# Patient Record
Sex: Female | Born: 1955 | Hispanic: No | Marital: Married | Smoking: Never smoker
Health system: Western US, Academic
[De-identification: ages and names within clinical notes are randomized; demographics above are authoritative.]

## PROBLEM LIST (undated history)

## (undated) MED ORDER — ACETAMINOPHEN 325 MG PO TABS
650.00 mg | ORAL_TABLET | Freq: Once | ORAL | Status: AC
Start: 2022-08-25 — End: 2022-08-25

## (undated) MED ORDER — SODIUM CHLORIDE 0.9 % IV BOLUS
500.00 mL | INJECTION | Freq: Once | INTRAVENOUS | Status: AC
Start: 2022-05-05 — End: 2022-05-05

## (undated) MED ORDER — OCTREOTIDE ACETATE 30 MG IM KIT
30.00 mg | PACK | Freq: Once | INTRAMUSCULAR | Status: AC
Start: 2021-03-04 — End: 2021-03-05

## (undated) MED ORDER — OCTREOTIDE ACETATE 30 MG IM KIT
30.00 mg | PACK | Freq: Once | INTRAMUSCULAR | Status: AC
Start: 2022-05-28 — End: 2022-05-27

## (undated) MED ORDER — DEXAMETHASONE SODIUM PHOSPHATE 10 MG/ML IJ SOLN
10.00 mg | Freq: Once | INTRAMUSCULAR | Status: AC | PRN
Start: 2022-06-30 — End: ?

## (undated) MED ORDER — ONDANSETRON HCL 4 MG/2ML IV SOLN
8.00 mg | Freq: Once | INTRAMUSCULAR | Status: AC
Start: 2022-10-20 — End: 2022-10-20

## (undated) MED ORDER — ACETAMINOPHEN 325 MG PO TABS
650.00 mg | ORAL_TABLET | Freq: Once | ORAL | Status: AC
Start: 2022-10-20 — End: 2022-10-20

## (undated) MED ORDER — ONDANSETRON HCL 4 MG/2ML IV SOLN
8.00 mg | Freq: Once | INTRAMUSCULAR | Status: AC
Start: 2022-08-25 — End: 2022-08-25

## (undated) MED ORDER — OCTREOTIDE ACETATE 30 MG IM KIT
30.0000 mg | PACK | Freq: Once | INTRAMUSCULAR | Status: AC
Start: 2021-11-11 — End: 2021-11-11

## (undated) MED ORDER — ACETAMINOPHEN 325 MG PO TABS
650.00 mg | ORAL_TABLET | Freq: Once | ORAL | Status: AC | PRN
Start: 2022-08-25 — End: 2022-08-25

## (undated) MED ORDER — OCTREOTIDE ACETATE 30 MG IM KIT
30.0000 mg | PACK | Freq: Once | INTRAMUSCULAR | Status: AC
Start: 2021-10-14 — End: 2021-10-14

## (undated) MED ORDER — OCTREOTIDE ACETATE 30 MG IM KIT
30.00 mg | PACK | Freq: Once | INTRAMUSCULAR | Status: AC
Start: 2022-07-29 — End: 2022-07-29

## (undated) MED ORDER — FAMOTIDINE (PF) 20 MG/2ML IV SOLN
20.00 mg | Freq: Once | INTRAVENOUS | Status: AC | PRN
Start: 2022-10-20 — End: 2022-10-21

## (undated) MED ORDER — SODIUM CHLORIDE 0.9 % IV SOLN
Freq: Once | INTRAVENOUS | Status: AC
Start: 2022-10-20 — End: 2022-10-20

## (undated) MED ORDER — OCTREOTIDE ACETATE 30 MG IM KIT
30.00 mg | PACK | Freq: Once | INTRAMUSCULAR | Status: AC
Start: 2022-10-20 — End: 2022-10-20

## (undated) MED ORDER — DEXTROSE 50 % IV SOLN
25.00 g | Freq: Once | INTRAVENOUS | Status: AC | PRN
Start: 2022-10-20 — End: 2022-10-20

## (undated) MED ORDER — AMINO ACID 5 % IV SOLN
1000.00 mL | Freq: Once | INTRAVENOUS | Status: AC
Start: 2022-06-30 — End: 2022-06-30

## (undated) MED ORDER — AMINO ACID 5 % IV SOLN
1000.00 mL | Freq: Once | INTRAVENOUS | Status: AC
Start: 2022-05-05 — End: 2022-05-05

## (undated) MED ORDER — DEXAMETHASONE SODIUM PHOSPHATE 10 MG/ML IJ SOLN
10.00 mg | Freq: Once | INTRAMUSCULAR | Status: AC
Start: 2022-05-05 — End: 2022-05-05

## (undated) MED ORDER — AMINO ACID 5 % IV SOLN
1000.00 mL | Freq: Once | INTRAVENOUS | Status: AC
Start: 2022-08-25 — End: 2022-08-25

## (undated) MED ORDER — OCTREOTIDE ACETATE 30 MG IM KIT
30.0000 mg | PACK | Freq: Once | INTRAMUSCULAR | Status: AC
Start: 2021-08-19 — End: 2021-08-19

## (undated) MED ORDER — OCTREOTIDE ACETATE 30 MG IM KIT
30.00 mg | PACK | Freq: Once | INTRAMUSCULAR | Status: AC
Start: 2022-02-03 — End: 2022-02-03

## (undated) MED ORDER — SODIUM CHLORIDE 0.9 % IV SOLN
Freq: Once | INTRAVENOUS | Status: AC
Start: 2022-06-30 — End: 2022-06-30

## (undated) MED ORDER — ONDANSETRON HCL 4 MG/2ML IV SOLN
8.00 mg | Freq: Once | INTRAMUSCULAR | Status: AC
Start: 2022-06-30 — End: 2022-06-30

## (undated) MED ORDER — OCTREOTIDE ACETATE 30 MG IM KIT
30.00 mg | PACK | Freq: Once | INTRAMUSCULAR | Status: AC
Start: 2020-07-23 — End: 2020-07-23

## (undated) MED ORDER — LANREOTIDE ACETATE 120 MG/0.5ML SC SOLN
120.00 mg | Freq: Once | SUBCUTANEOUS | Status: AC
Start: 2023-01-21 — End: 2023-01-21

## (undated) MED ORDER — SODIUM CHLORIDE 0.9 % IV BOLUS
500.00 mL | INJECTION | Freq: Once | INTRAVENOUS | Status: AC
Start: 2022-10-20 — End: 2022-10-20

## (undated) MED ORDER — LANREOTIDE ACETATE 120 MG/0.5ML SC SOLN
120.00 mg | Freq: Once | SUBCUTANEOUS | Status: AC
Start: 2022-11-26 — End: 2022-11-26

## (undated) MED ORDER — OCTREOTIDE ACETATE 30 MG IM KIT
30.0000 mg | PACK | Freq: Once | INTRAMUSCULAR | Status: AC
Start: 2021-04-01 — End: 2021-04-02

## (undated) MED ORDER — DEXTROSE 5 % IV BOLUS
1000.00 mL | INJECTION | Freq: Once | INTRAVENOUS | Status: AC | PRN
Start: 2022-10-20 — End: ?

## (undated) MED ORDER — ONDANSETRON HCL 4 MG/2ML IV SOLN
8.00 mg | Freq: Once | INTRAMUSCULAR | Status: AC | PRN
Start: 2022-08-25 — End: 2022-08-25

## (undated) MED ORDER — AMINO ACID 5 % IV SOLN
1000.00 mL | Freq: Once | INTRAVENOUS | Status: AC
Start: 2022-10-20 — End: 2022-10-20

## (undated) MED ORDER — DEXTROSE 50 % IV SOLN
25.00 g | Freq: Once | INTRAVENOUS | Status: AC | PRN
Start: 2022-05-05 — End: 2022-05-05

## (undated) MED ORDER — OCTREOTIDE ACETATE 30 MG IM KIT
30.00 mg | PACK | Freq: Once | INTRAMUSCULAR | Status: AC
Start: 2021-01-07 — End: 2021-01-07

## (undated) MED ORDER — OCTREOTIDE ACETATE 30 MG IM KIT
30.00 mg | PACK | Freq: Once | INTRAMUSCULAR | Status: AC
Start: 2020-10-15 — End: 2020-10-15

## (undated) MED ORDER — SODIUM CHLORIDE 0.9 % IV SOLN
10.00 mg | Freq: Once | INTRAVENOUS | Status: AC | PRN
Start: 2022-05-05 — End: ?

## (undated) MED ORDER — LANREOTIDE ACETATE 120 MG/0.5ML SC SOLN
120.00 mg | Freq: Once | SUBCUTANEOUS | Status: AC
Start: 2023-03-18 — End: 2023-03-18

## (undated) MED ORDER — LANREOTIDE ACETATE 120 MG/0.5ML SC SOLN
120.0000 mg | Freq: Once | SUBCUTANEOUS | Status: AC
Start: 2023-07-08 — End: 2023-07-10

## (undated) MED ORDER — ONDANSETRON HCL 4 MG/2ML IV SOLN
8.00 mg | Freq: Once | INTRAMUSCULAR | Status: AC
Start: 2022-05-05 — End: 2022-05-05

## (undated) MED ORDER — OCTREOTIDE ACETATE 30 MG IM KIT
30.00 mg | PACK | Freq: Once | INTRAMUSCULAR | Status: AC
Start: 2020-11-12 — End: 2020-11-12

## (undated) MED ORDER — DEXTROSE 50 % IV SOLN
25.00 g | Freq: Once | INTRAVENOUS | Status: AC | PRN
Start: 2022-08-25 — End: 2022-08-25

## (undated) MED ORDER — SODIUM CHLORIDE 0.9 % IV SOLN
10.00 mg | Freq: Once | INTRAVENOUS | Status: AC
Start: 2022-08-25 — End: 2022-08-25

## (undated) MED ORDER — FAMOTIDINE (PF) 20 MG/2ML IV SOLN
20.00 mg | Freq: Once | INTRAVENOUS | Status: AC
Start: 2022-08-25 — End: 2022-08-25

## (undated) MED ORDER — SODIUM CHLORIDE 0.9 % IV BOLUS
500.00 mL | INJECTION | Freq: Once | INTRAVENOUS | Status: AC
Start: 2022-06-30 — End: 2022-06-30

## (undated) MED ORDER — OCTREOTIDE ACETATE 30 MG IM KIT
30.0000 mg | PACK | Freq: Once | INTRAMUSCULAR | Status: AC
Start: 2021-07-22 — End: 2021-07-22

## (undated) MED ORDER — LANREOTIDE ACETATE 120 MG/0.5ML SC SOLN
120.00 mg | Freq: Once | SUBCUTANEOUS | Status: AC
Start: 2022-12-24 — End: 2022-12-24

## (undated) MED ORDER — ACETAMINOPHEN 325 MG PO TABS
650.00 mg | ORAL_TABLET | Freq: Once | ORAL | Status: AC | PRN
Start: 2022-06-30 — End: 2022-06-30

## (undated) MED ORDER — ONDANSETRON HCL 4 MG/2ML IV SOLN
8.00 mg | Freq: Once | INTRAMUSCULAR | Status: AC | PRN
Start: 2022-10-20 — End: 2022-10-20

## (undated) MED ORDER — OCTREOTIDE ACETATE 30 MG IM KIT
30.00 mg | PACK | Freq: Once | INTRAMUSCULAR | Status: AC
Start: 2020-08-20 — End: 2020-08-20

## (undated) MED ORDER — FAMOTIDINE (PF) 20 MG/2ML IV SOLN
20.00 mg | Freq: Once | INTRAVENOUS | Status: AC | PRN
Start: 2022-06-30 — End: 2022-07-01

## (undated) MED ORDER — OCTREOTIDE ACETATE 30 MG IM KIT
30.00 mg | PACK | Freq: Once | INTRAMUSCULAR | Status: AC
Start: 2020-06-25 — End: 2020-06-25

## (undated) MED ORDER — OCTREOTIDE ACETATE 30 MG IM KIT
30.00 mg | PACK | Freq: Once | INTRAMUSCULAR | Status: AC
Start: 2022-03-03 — End: 2022-03-03

## (undated) MED ORDER — OCTREOTIDE ACETATE 30 MG IM KIT
30.00 mg | PACK | Freq: Once | INTRAMUSCULAR | Status: AC
Start: 2022-03-31 — End: 2022-03-31

## (undated) MED ORDER — LANREOTIDE ACETATE 120 MG/0.5ML SC SOLN
120.0000 mg | Freq: Once | SUBCUTANEOUS | Status: AC
Start: 2023-06-10 — End: 2023-06-12

## (undated) MED ORDER — INSULIN REGULAR HUMAN 100 UNIT/ML IJ SOLN
10.00 [IU] | Freq: Once | INTRAMUSCULAR | Status: AC | PRN
Start: 2022-08-25 — End: 2022-08-25

## (undated) MED ORDER — LANREOTIDE ACETATE 120 MG/0.5ML SC SOLN
120.0000 mg | Freq: Once | SUBCUTANEOUS | Status: AC
Start: 2023-05-15 — End: 2023-05-13

## (undated) MED ORDER — DEXTROSE 5 % IV BOLUS
1000.00 mL | INJECTION | Freq: Once | INTRAVENOUS | Status: AC | PRN
Start: 2022-06-30 — End: ?

## (undated) MED ORDER — ACETAMINOPHEN 325 MG PO TABS
650.00 mg | ORAL_TABLET | Freq: Once | ORAL | Status: AC
Start: 2022-06-30 — End: 2022-06-30

## (undated) MED ORDER — ACETAMINOPHEN 325 MG PO TABS
650.00 mg | ORAL_TABLET | Freq: Once | ORAL | Status: AC
Start: 2022-05-05 — End: 2022-05-05

## (undated) MED ORDER — OCTREOTIDE ACETATE 30 MG IM KIT
30.00 mg | PACK | Freq: Once | INTRAMUSCULAR | Status: AC
Start: 2022-06-30 — End: 2022-06-30

## (undated) MED ORDER — OCTREOTIDE ACETATE 30 MG IM KIT
30.00 mg | PACK | Freq: Once | INTRAMUSCULAR | Status: AC
Start: 2022-05-05 — End: 2022-05-05

## (undated) MED ORDER — OCTREOTIDE ACETATE 30 MG IM KIT
30.00 mg | PACK | Freq: Once | INTRAMUSCULAR | Status: AC
Start: 2022-08-25 — End: 2022-08-25

## (undated) MED ORDER — OCTREOTIDE ACETATE 30 MG IM KIT
30.00 mg | PACK | Freq: Once | INTRAMUSCULAR | Status: AC
Start: 2020-12-10 — End: 2020-12-10

## (undated) MED ORDER — OCTREOTIDE ACETATE 30 MG IM KIT
30.00 mg | PACK | Freq: Once | INTRAMUSCULAR | Status: AC
Start: 2022-09-22 — End: 2022-09-22

## (undated) MED ORDER — OCTREOTIDE ACETATE 30 MG IM KIT
30.0000 mg | PACK | Freq: Once | INTRAMUSCULAR | Status: AC
Start: 2021-12-09 — End: 2021-12-09

## (undated) MED ORDER — SODIUM CHLORIDE 0.9 % IV SOLN
Freq: Once | INTRAVENOUS | Status: AC
Start: 2022-05-05 — End: 2022-05-05

## (undated) MED ORDER — FAMOTIDINE (PF) 20 MG/2ML IV SOLN
20.00 mg | Freq: Once | INTRAVENOUS | Status: AC
Start: 2022-10-20 — End: 2022-10-20

## (undated) MED ORDER — FAMOTIDINE (PF) 20 MG/2ML IV SOLN
20.00 mg | Freq: Once | INTRAVENOUS | Status: AC | PRN
Start: 2022-08-25 — End: 2022-08-26

## (undated) MED ORDER — LANREOTIDE ACETATE 120 MG/0.5ML SC SOLN
120.00 mg | Freq: Once | SUBCUTANEOUS | Status: AC
Start: 2023-02-18 — End: 2023-02-18

## (undated) MED ORDER — ONDANSETRON HCL 4 MG/2ML IV SOLN
8.00 mg | Freq: Once | INTRAMUSCULAR | Status: AC | PRN
Start: 2022-05-05 — End: 2022-05-05

## (undated) MED ORDER — OCTREOTIDE ACETATE 30 MG IM KIT
30.00 mg | PACK | Freq: Once | INTRAMUSCULAR | Status: AC
Start: 2021-02-04 — End: 2021-02-05

## (undated) MED ORDER — FAMOTIDINE (PF) 20 MG/2ML IV SOLN
20.00 mg | Freq: Once | INTRAVENOUS | Status: AC
Start: 2022-06-30 — End: 2022-06-30

## (undated) MED ORDER — DEXAMETHASONE SODIUM PHOSPHATE 10 MG/ML IJ SOLN
10.00 mg | Freq: Once | INTRAMUSCULAR | Status: AC
Start: 2022-10-20 — End: 2022-10-20

## (undated) MED ORDER — FAMOTIDINE (PF) 20 MG/2ML IV SOLN
20.00 mg | Freq: Once | INTRAVENOUS | Status: AC
Start: 2022-05-05 — End: 2022-05-05

## (undated) MED ORDER — LANREOTIDE ACETATE 120 MG/0.5ML SC SOLN
120.00 mg | Freq: Once | SUBCUTANEOUS | Status: AC
Start: 2023-04-15 — End: 2023-04-15

## (undated) MED ORDER — OCTREOTIDE ACETATE 30 MG IM KIT
30.0000 mg | PACK | Freq: Once | INTRAMUSCULAR | Status: AC
Start: 2021-06-24 — End: 2021-06-25

## (undated) MED ORDER — DEXAMETHASONE SODIUM PHOSPHATE 10 MG/ML IJ SOLN
10.00 mg | Freq: Once | INTRAMUSCULAR | Status: AC | PRN
Start: 2022-08-25 — End: ?

## (undated) MED ORDER — OCTREOTIDE ACETATE 30 MG IM KIT
30.0000 mg | PACK | Freq: Once | INTRAMUSCULAR | Status: AC
Start: 2021-04-29 — End: 2021-04-30

## (undated) MED ORDER — SODIUM CHLORIDE 0.9 % IV BOLUS
500.00 mL | INJECTION | Freq: Once | INTRAVENOUS | Status: AC
Start: 2022-08-25 — End: 2022-08-25

## (undated) MED ORDER — DEXAMETHASONE SODIUM PHOSPHATE 10 MG/ML IJ SOLN
10.00 mg | Freq: Once | INTRAMUSCULAR | Status: AC
Start: 2022-06-30 — End: 2022-06-30

## (undated) MED ORDER — SODIUM CHLORIDE 0.9 % IV SOLN
Freq: Once | INTRAVENOUS | Status: AC
Start: 2022-08-25 — End: 2022-08-25

## (undated) MED ORDER — DEXTROSE 5 % IV BOLUS
1000.00 mL | INJECTION | Freq: Once | INTRAVENOUS | Status: AC | PRN
Start: 2022-08-25 — End: ?

## (undated) MED ORDER — LANREOTIDE ACETATE 120 MG/0.5ML SC SOLN
120.0000 mg | Freq: Once | SUBCUTANEOUS | Status: AC
Start: 2023-08-05 — End: 2023-08-07

## (undated) MED ORDER — OCTREOTIDE ACETATE 30 MG IM KIT
30.0000 mg | PACK | Freq: Once | INTRAMUSCULAR | Status: AC
Start: 2022-01-06 — End: 2022-01-06

## (undated) MED ORDER — ONDANSETRON HCL 4 MG/2ML IV SOLN
8.00 mg | Freq: Once | INTRAMUSCULAR | Status: AC | PRN
Start: 2022-06-30 — End: 2022-06-30

## (undated) MED ORDER — FAMOTIDINE (PF) 20 MG/2ML IV SOLN
20.00 mg | Freq: Once | INTRAVENOUS | Status: AC | PRN
Start: 2022-05-05 — End: 2022-05-06

## (undated) MED ORDER — DEXAMETHASONE SODIUM PHOSPHATE 10 MG/ML IJ SOLN
10.00 mg | Freq: Once | INTRAMUSCULAR | Status: AC | PRN
Start: 2022-10-20 — End: ?

## (undated) MED ORDER — DEXTROSE 5 % IV BOLUS
1000.00 mL | INJECTION | Freq: Once | INTRAVENOUS | Status: AC | PRN
Start: 2022-05-05 — End: ?

## (undated) MED ORDER — OCTREOTIDE ACETATE 30 MG IM KIT
30.0000 mg | PACK | Freq: Once | INTRAMUSCULAR | Status: AC
Start: 2021-09-16 — End: 2021-09-16

## (undated) MED ORDER — OCTREOTIDE ACETATE 30 MG IM KIT
30.0000 mg | PACK | Freq: Once | INTRAMUSCULAR | Status: AC
Start: 2021-05-27 — End: 2021-05-28

## (undated) MED ORDER — ACETAMINOPHEN 325 MG PO TABS
650.00 mg | ORAL_TABLET | Freq: Once | ORAL | Status: AC | PRN
Start: 2022-10-20 — End: 2022-10-20

## (undated) MED ORDER — INSULIN REGULAR HUMAN 100 UNIT/ML IJ SOLN
10.00 [IU] | Freq: Once | INTRAMUSCULAR | Status: AC | PRN
Start: 2022-10-20 — End: 2022-10-20

## (undated) MED ORDER — ACETAMINOPHEN 325 MG PO TABS
650.00 mg | ORAL_TABLET | Freq: Once | ORAL | Status: AC | PRN
Start: 2022-05-05 — End: 2022-05-05

## (undated) MED ORDER — DEXTROSE 50 % IV SOLN
25.00 g | Freq: Once | INTRAVENOUS | Status: AC | PRN
Start: 2022-06-30 — End: 2022-06-30

## (undated) MED ORDER — INSULIN REGULAR HUMAN 100 UNIT/ML IJ SOLN
10.00 [IU] | Freq: Once | INTRAMUSCULAR | Status: AC | PRN
Start: 2022-06-30 — End: 2022-06-30

## (undated) MED ORDER — INSULIN REGULAR HUMAN 100 UNIT/ML IJ SOLN
10.00 [IU] | Freq: Once | INTRAMUSCULAR | Status: AC | PRN
Start: 2022-05-05 — End: 2022-05-05

## (undated) MED ORDER — OCTREOTIDE ACETATE 30 MG IM KIT
30.00 mg | PACK | Freq: Once | INTRAMUSCULAR | Status: AC
Start: 2020-09-17 — End: 2020-09-17

---

## 2017-09-08 DIAGNOSIS — C254 Malignant neoplasm of endocrine pancreas: Secondary | ICD-10-CM | POA: Insufficient documentation

## 2019-04-12 ENCOUNTER — Telehealth (HOSPITAL_BASED_OUTPATIENT_CLINIC_OR_DEPARTMENT_OTHER): Payer: Self-pay | Admitting: Hematology & Oncology

## 2019-04-12 NOTE — Telephone Encounter (Signed)
Please note this NEW patient has been scheduled in your clinic on Thursday, 11/5.  Please review records in time to ensure you have everything that you need for this upcoming appointment. Please let me know if additional records are needed.     Thank you,   Andee Lineman

## 2019-04-27 ENCOUNTER — Ambulatory Visit: Payer: TRICARE Prime—HMO | Attending: Hematology & Oncology | Admitting: Hematology & Oncology

## 2019-04-27 VITALS — BP 153/81 | HR 122 | Temp 98.7°F | Resp 16 | Ht 66.0 in | Wt 159.6 lb

## 2019-04-27 DIAGNOSIS — C787 Secondary malignant neoplasm of liver and intrahepatic bile duct: Secondary | ICD-10-CM | POA: Insufficient documentation

## 2019-04-27 DIAGNOSIS — R197 Diarrhea, unspecified: Secondary | ICD-10-CM | POA: Insufficient documentation

## 2019-04-27 DIAGNOSIS — D3A8 Other benign neuroendocrine tumors: Secondary | ICD-10-CM | POA: Insufficient documentation

## 2019-04-27 DIAGNOSIS — E340 Carcinoid syndrome: Secondary | ICD-10-CM | POA: Insufficient documentation

## 2019-04-27 MED ORDER — OCTREOTIDE ACETATE 30 MG IM KIT: 30.00 mg | PACK | INTRAMUSCULAR | Status: AC

## 2019-04-27 NOTE — Patient Instructions (Addendum)
INSTRUCTIONS:    - We will request molecular testing with [facility].  They will contact you to arrange a lab draw  - Return to see Dr. Ane Payment in the beginning of January after scans are complete.   - We recommend continuing octreotide with addition of Everolimus.  If you have progression on this regimen we would then consider Ludathera vs Xeloda/Temodar    _____________________________________________________________________    Your Team:    MyChart is our preferred method of communication.  If you send messages to Dr. Ane Payment it will also go to the rest of our team    PHYSICIAN: Margreta Journey, MD PhD    NURSE PRACTITIONER: Theodis Aguas ANP - [facility]    NURSE CASE MANAGER: Arley Phenix RN, BSN, OCN   Email: [email or web address]  Phone: [phone]  Fax: [phone]    My scheduled day off is Friday.  Please send a MyChart message or leave a voicemail and the covering nurse will respond.  My email will not be monitored on Fridays or Weekends.    I may be unable to answer phone calls/voicemails immediately during clinic days, so if you have an urgent question please call our call center [phone]) and they can page me.  You can also choose to speak to the triage nurse for urgent medical questions by selecting that option when you call me.     ADMINISTRATIVE ASSISTANT: Graylon Good (Paperwork, obtaining imaging, scheduling)  Phone: [phone]  Fax: [phone]    *IF you are receiving infusions, please check with the infusion center for a code to enable you to receive free parking on infusion days. Please call [phone] Option 2, or stop at the front desk on your way out. These codes change monthly*                                                                                                                                                 If you have a medical emergency or life threatening situaton, please call 911 or go to the nearest Emergency Room.    AFTER-HOURS CARE:    If you develop a fever  of 100.1F or greater call Arley Phenix and go to [place] ER. If the fever happens after hours (On the weekends and/or Monday through Friday before 8:00am or after 4:30pm) please call [phone] and ask to speak with the On-Call Oncologist. Please also call the On-Call Oncologist for any urgent symptoms that happen after normal business hours.    AFTER HOURS EMERGENCY NUMBER [phone]                       Ask for the Oncology Physician On-Call     The following link is a video that will help explain choosing a healthcare proxy and the process of advanced care planning.   https://[email or web address].com/program-preview-who-speaks-for-you/  [facility] Phone List for Patients     Clinic Hours of Operation: Monday-Friday 8:00- 4:30 p.m. (closed holidays and weekends)   Infusion Center Hours: Monday-Friday 7:00-9:30 p.m.; Sat-Sun 8:00-4:30pm.     Social Worker: Neysa Bonito, LCSW [phone]  For emotional, social, spiritual and practical needs that may arise throughout cancer treatment and recovery.    Information Desk/Call Center: [phone]   ** General information: directions, telephone numbers, or available services     [place]: [phone], option 2  ** Call to schedule, cancel, or reschedule chemotherapy appointments   [place] Scheduling: [phone]  [place] Scheduling: [phone]    Radiation Oncology: [phone]   ** Call to schedule, cancel, or reschedule radiation appointments     Financial Counselor: Esperanza Richters [phone]   [email or web address]   [phone] is the centralized billing and insurance information number with a team of knowledgeable representatives ready to help M-F 9am to 6pm.     Registration: [phone] (to update personal information)    [place]:   [phone]  Open M-F 9-6pm    Radiology Scheduling: [phone]   For PET scans: [phone]    Medical Records: [phone]  Fax: [phone]    Chemotherapy Online Education:  Link to recorded Chemotherapy & Immunotherapy Education Class: https://www.hunt.info/     Patient and [place]:  The Patient and [facility]) offers: The most up-to-date cancer information for patients, families, and the community. Informative brochures, videos, and books to help you learn about your cancer diagnosis, treatment options, managing symptoms and side effects, clinical trials, nutrition, coping, caregiving, and self-care. Computers with Internet access to the most comprehensive and credible websites for cancer information and community resources. Various comfort items such as lap blankets, pillows, hats, and wigs.  Open daily; hours vary   Location: [facility], Room 1066, on the first floor just to the left of the lobby elevators  Phone [phone]     *If you get a bill from a molecular testing company ([place], [place], [place], [place], [place]) please let us know before you pay. Our staff can help resolve this.

## 2019-04-27 NOTE — Progress Notes (Signed)
error 

## 2019-04-27 NOTE — Interdisciplinary (Signed)
Appointment with Dr. Ane Payment      Reason for Visit: new patient visit     Diagnosis: pancreatic cancer     Nursing Assessment:   Allergies and medications updated as indicated per patient intake form. Patient met with and was assessed by Probation officer.  Patient is accompanied by self  Neurologic: Orientation: A&O x3   Respiratory/Cardiac: regular   Insomnia: denies   Fatigue: Pt able to perform activities of daily living    Nutrition: Patient tolerates Regular diet   Appetite: good   Nausea: denies   Vomiting: denies    Diarrhea: denies   Constipation: denies   WellBeing assessment: Patient Declines WB assessment      Patient states that she is feeling well overall.  She is here to discuss possible ludathera treatment     Cancer Treatment Plan  Current Medications: octreotide  Other Treatment: n/a  Clinical Trial Coordinator: n/a     MD updated on assessment        Plan:   - We will request molecular testing with [facility].  They will contact you to arrange a lab draw  - Return to see Dr. Ane Payment in the beginning of January after scans are complete.   - We recommend continuing octreotide with addition of Everolimus.  If you have progression on this regimen we would then consider Ludathera vs Xeloda/Temodar           Education: Network engineer and verbal instruction provided regarding plan of care.   Barriers to learning assessed: No barriers noted. Patient verbalized understanding of teaching and instructions.         Contact information, AVS given and reviewed related to plan of care. Questions answered to patient's satisfaction and verbalized understanding. Patient/family aware to call with any questions or concerns that may arise.     Erle Crocker, RN BSN OCN

## 2019-05-05 ENCOUNTER — Telehealth (HOSPITAL_BASED_OUTPATIENT_CLINIC_OR_DEPARTMENT_OTHER): Payer: Self-pay

## 2019-05-05 DIAGNOSIS — E340 Carcinoid syndrome: Secondary | ICD-10-CM | POA: Insufficient documentation

## 2019-05-05 DIAGNOSIS — C787 Secondary malignant neoplasm of liver and intrahepatic bile duct: Secondary | ICD-10-CM | POA: Insufficient documentation

## 2019-05-05 DIAGNOSIS — D3A8 Other benign neuroendocrine tumors: Secondary | ICD-10-CM | POA: Insufficient documentation

## 2019-05-05 DIAGNOSIS — R197 Diarrhea, unspecified: Secondary | ICD-10-CM | POA: Insufficient documentation

## 2019-05-05 HISTORY — DX: Secondary malignant neoplasm of liver and intrahepatic bile duct (CMS-HCC): C78.7

## 2019-05-05 NOTE — Progress Notes (Signed)
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    History of PNET [place] 08/2017. Previous to this noted watery diarrhea and had increase from 215mto 3013mith good resolution. Has had no abdominal pain or carcinoid symptoms. Recent CT CAP and MRI of pancreas October 2020 found new hepatic lesions and slight interval growth compared with 1 year prior.    She is here to discuss additional treatment options for her and overall prognosis.     Review of systems: No abdominal pain, flushing, or over diarrhea otherwise, all other systems reviewed and are negative except for what is described in the history of present illness.    Oncologic Timeline:  09/09/2017-04/06/2019: Octreotide Qmonthly  04/27/2019: [facility]    -----------------------------  Past Medical History: No past medical history on file.    Medications:   Current Outpatient Medications:   .  octreotide (SANDOSTATIN LAR) 30 MG injection, 30 mg by IntraMUSCULAR route every 28 days., Disp: , Rfl:     Allergies: No Known Allergies    Social history:   Alcohol: None  Tobacco: None  Drugs: None  Living Situation: Lives with husband, daughter is a PhD sciPersonal assistant [place] the [place]inesstory:  Father: Parkinson/Lewy Body Dementia  Cousins: breast, brain, colon cancer and lymphoma    -----------------------------  Physical exam:  Vitals:BP 153/81 (BP Location: Left arm, BP Patient Position: Sitting, BP cuff size: Regular)   Pulse 122   Temp 98.7 F (37.1 C) (Temporal)   Resp 16   Ht '5\' 6"'  (1.676 m)   Wt 72.4 kg (159 lb 9.6 oz)   SpO2 96%    BMI 25.76 kg/m   Wt Readings from Last 3 Encounters:   04/27/19 72.4 kg (159 lb 9.6 oz)       ECOG: 1  GENERAL: No acute distress, pleasant, talkative  EYES: Pupils equal round reactive to light, sclera anicteric  ENT: Oropharynx clear, moist mucous membranes, no oral lesions or thrush  NECK: Supple  LYMPH NODES: No cervical, supraclavicular, axillary or inguinal lymphadenopathy  CARDIOVASCULAR: Regular rate and rhythm, no murmurs  LUNGS: Clear to auscultation bilaterally  GASTROINTESTINAL: Soft, nontender, nondistended.  No palpable hepatosplenomegaly  EXTREMITIES: No edema  SKIN: No rashes or petechiae  BACK: No midline tenderness  NEURO: Cranial nerves II through XII intact, strength 5 out of 5 throughout, intact sensation to light touch, and symmetric, normal gait    -----------------------------  Labs: Reviewed by myself    Pancreatic Polypeptide 07/2017: 837  Chromogranin A 07/2017: 1  CA19-9 07/2017: 8  Alk Phos 11/2018: 176    Colonoscopy    Radiology: Reviewed by myself in conjunction with radiology report  04/04/2019 MRI Pancreas    CT Chest 03/27/2019  - No evidence of thoracic metastatic disease    CT A/P 03/24/2019    PET Gallium 68  - Primary pancreatic mass with intense radiotracer uptake and multiple hepatic metastatic lesions    Pathology:    Ki67 3%    Molecular:  Unknown  -----------------------------  Assessment &  Plan:  Jenna Bridges is a 63 year old female presenting with metastatic PNET with progression on 32m Octreotide Monthly.    We discussed with the patient and their family the natural course of NET. We discussed how many of these come from GI sources including the pancreas, colon, and stomach. Occasionally they are seen from the lung as well. We discussed that they can be indolent and slow growing or quickly capable of rapid growth and spread. Treatment options for symptom relief include somatostatin analogues. Although fewer than 10% of patients see tumor shrinkage with these drugs, prolonged  stable disease is seen in 66% which delays progression.     We discussed that if there was interval growth on the next imaging further options include everolimus, CAPTEM, TKIs (Sunitinib, sorafenib, pazopanib, and cabozantinib) and Lutathera. We will also evaluate the ability to send tissue for NGS today with [facility]    Ultimately we would continue octreotide at this time and add everolimus. We discussed evaluating in 2 months to see if growth stopped or continued. After this we would then recommend either CAPTEM or Lutathera. We discussed the side effects dermatologically of everolimus and instructed the patient to pre-empt the rash with a good face hygiene regimen.     Would have an RV in January to discuss restaging scans.      ICD-10-CM ICD-9-CM   1. Primary pancreatic neuroendocrine tumor  D3A.8 209.69   2. Liver metastases (CMS-HCC)  C78.7 197.7   3. Carcinoid syndrome (CMS-HCC)  E34.0 259.2   4. Diarrhea, unspecified type  R19.7 787.91         Therapeutic consent is obtained with all patients prior to the first infusion visit or dose. We discuss alternative treatments, expected side effects, and rare occurrences. Further the patient and I discuss the expected outcomes as well as contact number for anyone on our team.    Thank you Dr. NVeverly Fellsfor allowing [place] take part in the care of your patient.    I spent 60 minutes face to face with the patient. Greater than 50% of the visit was spent discussing plan, management, outcomes. The patient and I conversed and they expressed understanding of the plan.     GMargreta Journey MD/PhD  [place]   CC:  [facility] -----------------------------  Lab No orders of the defined types were placed in this encounter.    Imaging No orders of the defined types were placed in this encounter.    Procedures No orders of the defined types were placed in this encounter.    Other No orders of the defined types were placed in this encounter.

## 2019-05-05 NOTE — Telephone Encounter (Signed)
[facility] test requisition form submitted:   - Test Ordered: xT  - Specimen Site: Pancreas/ Liver  - Specimen ID: [id number]  - Date of collection: 20/01/2018      Requisition form, pathology report and insurance information order online with [facility]  Confirmation received.    Tissue at [employer] center

## 2019-06-14 NOTE — Telephone Encounter (Signed)
[facility] Report completed report in media    -Clarise Cruz, [place]

## 2019-09-19 ENCOUNTER — Encounter (INDEPENDENT_AMBULATORY_CARE_PROVIDER_SITE_OTHER): Payer: Self-pay

## 2020-04-30 ENCOUNTER — Telehealth (HOSPITAL_BASED_OUTPATIENT_CLINIC_OR_DEPARTMENT_OTHER): Payer: Self-pay | Admitting: Hematology & Oncology

## 2020-04-30 NOTE — Telephone Encounter (Signed)
Voicemail message left for patient asking her to call Ana Dr. Letitia Neri AA to schedule her follow up visit.    Records and authorization received from [facility]

## 2020-05-22 ENCOUNTER — Encounter (HOSPITAL_BASED_OUTPATIENT_CLINIC_OR_DEPARTMENT_OTHER): Payer: Self-pay | Admitting: Hematology & Oncology

## 2020-05-22 ENCOUNTER — Ambulatory Visit: Payer: TRICARE Prime—HMO | Attending: Hematology & Oncology | Admitting: Hematology & Oncology

## 2020-05-22 VITALS — BP 136/86 | HR 79 | Temp 96.8°F | Resp 16 | Ht 66.0 in | Wt 153.2 lb

## 2020-05-22 DIAGNOSIS — E340 Carcinoid syndrome: Secondary | ICD-10-CM | POA: Insufficient documentation

## 2020-05-22 DIAGNOSIS — D3A8 Other benign neuroendocrine tumors: Secondary | ICD-10-CM | POA: Insufficient documentation

## 2020-05-22 DIAGNOSIS — C787 Secondary malignant neoplasm of liver and intrahepatic bile duct: Secondary | ICD-10-CM | POA: Insufficient documentation

## 2020-05-22 NOTE — Progress Notes (Signed)
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 [place]everly Fellsat [place] octreotide since 08/2017. Previous to this noted watery diarrhea and had increase from 229mto 3040mith good resolution. Has had no abdominal pain or carcinoid symptoms. Recent CT CAP and MRI of pancreas October 2020 found new hepatic lesions and slight interval growth compared with 1 year prior.    She is here to discuss additional treatment options for her and overall prognosis.     05/22/2020: presents alone. [facility] and that she [facility] care [facility] that says there is progression on her MRI scans but they continued on octreotide 71m76m she was clinically well. Otherwise notes diarrhea if her injection is delayed. [facility] then will need 06/2020 injection from that point forward.     Review of systems: No abdominal pain, flushing. Has diarrhea if delay in injection otherwise, all other systems reviewed and are negative except for what is described in the history of present illness.    Oncologic Timeline:  09/09/2017-04/06/2019: Octreotide Qmonthly  04/27/2019: [facility]  03/2020: Per report, progression on MRI, continues on octreotide 71mg45mthly  05/22/2020: F/U Dr. BottaAne Bridges----------------------------  Past Medical History: No past medical history on file.    Medications:   Current Outpatient Medications:     octreotide (SANDOSTATIN LAR) 30 MG injection, 30 mg by IntraMUSCULAR route every 28 days., Disp: , Rfl:     Allergies: No  Known Allergies    Social history:   Alcohol: None  Tobacco: None  Drugs: None  Living Situation: Lives with husband, daughter is a PhD scienPersonal assistantcripEdnaory:  Father: Parkinson/Lewy Body Dementia  Cousins: breast, brain, colon cancer and lymphoma    -----------------------------  Physical exam:  Vitals:BP 136/86 (BP Location: Left arm, BP Patient Position: Sitting, BP cuff size: Regular)    Pulse 79    Temp 96.8 F (36 C) (Temporal)    Resp 16    Ht '5\' 6"'  (1.676 m)    Wt 69.5 kg (153 lb 3.2 oz)    SpO2 100%    BMI 24.73 kg/m   Wt Readings from Last 3 Encounters:   05/22/20 69.5 kg (153 lb 3.2 oz)   04/27/19 72.4 kg (159 lb 9.6 oz)       ECOG: 1  GENERAL: No acute distress, pleasant, talkative  EYES: Pupils equal round reactive to light, sclera anicteric  ENT: Oropharynx clear, moist mucous membranes, no oral lesions or thrush  NECK: Supple  LYMPH NODES: No cervical, supraclavicular, axillary or inguinal lymphadenopathy  CARDIOVASCULAR: Regular rate and rhythm, no murmurs heard   LUNGS: Clear to auscultation bilaterally no wheezes  GASTROINTESTINAL: Soft, NTTP, nondistended.  No palpable hepatosplenomegaly  EXTREMITIES: No edema  SKIN: No rashes or petechiae  BACK: No midline tenderness  NEURO: Cranial nerves II through XII intact, strength 5 out of 5 throughout,  intact sensation to light touch, and symmetric, normal gait    -----------------------------  Labs: Reviewed by myself    Pancreatic Polypeptide 07/2017: 837  Chromogranin A 07/2017: 1  CA19-9 07/2017: 8  Alk Phos 11/2018: 176    Colonoscopy    Radiology: Reviewed by myself in conjunction with radiology report  03/2020 [facility] Report      04/04/2019 MRI Pancreas    CT Chest 03/27/2019  - No evidence of thoracic metastatic disease    CT A/P 03/24/2019    PET Gallium 68  - Primary pancreatic mass with intense radiotracer uptake and multiple hepatic metastatic lesions    Pathology:    Ki67 3%    Molecular:    PDL1  0%      -----------------------------  Assessment & Plan:  Jenna Bridges is a 64 year old female presenting with metastatic PNET with progression on 81m Octreotide Monthly.    We discussed with the patient and their family the natural course of NET. We discussed how many of these come from GI sources including the pancreas, colon, and stomach. Occasionally they are seen from the lung as well. We discussed that they can be indolent and slow growing or quickly capable of rapid growth and spread. Treatment options for symptom relief include somatostatin analogues. Although fewer than 10% of patients see tumor shrinkage with these drugs, prolonged stable disease is seen in 66% which delays progression.     We discussed that if there was interval growth on the next imaging further options include everolimus, CAPTEM, TKIs (Sunitinib, sorafenib, pazopanib, and cabozantinib) and Lutathera. We will also evaluate the ability to send tissue for NGS today with [facility]    Ultimately we would continue octreotide at this time and add everolimus. We discussed evaluating in 2 months to see if growth stopped or continued. After this we would then recommend either CAPTEM or Lutathera. We discussed the side effects dermatologically of everolimus and instructed the patient to pre-empt the rash with a good face hygiene regimen.     Would have an RV in January to discuss restaging scans.    05/22/2020: We will port [facility] now given 3 month interval. She previously had a PET GA68 to follow as well for [place] We discussed increasing octreotide or adding regorafenib/everolimus in addition next. We will schedule her for her 06/2020 injection as well to reduce her diarrheal burden. RV monthly until stability. NGS completed as above and added to chart. With MEN1 close to 50% we discussed germline testing however the patient wished not to pursue as her children are grown and per her report not having children or  already have had children. We discussed MEN can be used as a screening tool for future cancers and she will talk to them but doesn't want to make decision now.      ICD-10-CM ICD-9-CM   1. Primary pancreatic neuroendocrine tumor  D3A.8 209.69   2. Liver metastases (CMS-HCC)  C78.7 197.7   3. Carcinoid syndrome (CMS-HCC)  E34.0 259.2     Therapeutic consent is obtained with all patients prior to the first infusion visit or dose. We discuss alternative treatments, expected side effects, and rare occurrences. Further the patient and I discuss the expected outcomes as well as contact number for anyone on our team.    I spent a total of 40 minutes on the day of the visit.      GMargreta Journey MD/PhD  [facility]  [place]  -----------------------------  Lab No orders of the defined types were placed in this encounter.    Imaging   Orders Placed This Encounter   Procedures    MRI Abdomen + Pelvis WO/W Contrast     Procedures   Orders Placed This Encounter   Procedures    [place] Service Request     Other No orders of the defined types were placed in this encounter.

## 2020-06-19 ENCOUNTER — Telehealth (HOSPITAL_BASED_OUTPATIENT_CLINIC_OR_DEPARTMENT_OTHER): Payer: Self-pay

## 2020-06-19 NOTE — Telephone Encounter (Signed)
Received call from patient she is requesting a call back she stated she has an appointment to get her injection on 01/04 was told it was this has not been approved she is worried can some one give her a call with status.

## 2020-06-25 ENCOUNTER — Ambulatory Visit
Admission: RE | Admit: 2020-06-25 | Discharge: 2020-06-27 | Disposition: A | Discharge status: Home / Self Care | Payer: TRICARE Prime—HMO | Attending: Hematology & Oncology | Admitting: Hematology & Oncology

## 2020-06-25 VITALS — BP 143/76 | HR 83 | Temp 97.3°F | Resp 17 | Ht 65.55 in | Wt 153.7 lb

## 2020-06-25 DIAGNOSIS — D3A8 Other benign neuroendocrine tumors: Secondary | ICD-10-CM | POA: Insufficient documentation

## 2020-06-25 DIAGNOSIS — C787 Secondary malignant neoplasm of liver and intrahepatic bile duct: Secondary | ICD-10-CM | POA: Insufficient documentation

## 2020-06-25 DIAGNOSIS — E340 Carcinoid syndrome: Secondary | ICD-10-CM | POA: Insufficient documentation

## 2020-06-25 MED ORDER — OCTREOTIDE ACETATE 30 MG IM KIT
30.0000 mg | PACK | Freq: Once | INTRAMUSCULAR | Status: AC
Start: 2020-06-25 — End: 2020-06-25
  Administered 2020-06-25 (×2): 30 mg via INTRAMUSCULAR
  Filled 2020-06-25: qty 1

## 2020-06-25 NOTE — Interdisciplinary (Signed)
Patient in for Octreotide (Sandostatin).    Vital signs stable and Pt in no apparent distress.      Pt reports no nausea, vomiting, constipation, or diarrhea.    Pt given ongoing medication teaching and advised to seek medical attention if side effect occur.     Pt aware of precautions.     Octreotide given IM in the LEFT gluteus and bandage placed.    Patient tolerated injection.     NO LABS DRAWN TODAY    Medications   octreotide (SANDOSTATIN LAR) depot injection 30 mg (30 mg IntraMUSCULAR Given 06/25/20 1105)       Pt aware of next scheduled appointment.    [address] [place], [place]  06/25/20

## 2020-07-15 ENCOUNTER — Telehealth (HOSPITAL_BASED_OUTPATIENT_CLINIC_OR_DEPARTMENT_OTHER): Payer: Self-pay | Admitting: Internal Medicine

## 2020-07-15 ENCOUNTER — Emergency Department (HOSPITAL_COMMUNITY): Payer: TRICARE Prime—HMO

## 2020-07-15 ENCOUNTER — Encounter (HOSPITAL_BASED_OUTPATIENT_CLINIC_OR_DEPARTMENT_OTHER): Payer: Self-pay | Admitting: Hospital

## 2020-07-15 ENCOUNTER — Emergency Department
Admission: EM | Admit: 2020-07-15 | Discharge: 2020-07-15 | Disposition: A | Discharge status: Home / Self Care | Payer: TRICARE Prime—HMO | Attending: Emergency Medicine | Admitting: Emergency Medicine

## 2020-07-15 DIAGNOSIS — Z8507 Personal history of malignant neoplasm of pancreas: Secondary | ICD-10-CM | POA: Insufficient documentation

## 2020-07-15 DIAGNOSIS — C259 Malignant neoplasm of pancreas, unspecified: Secondary | ICD-10-CM

## 2020-07-15 DIAGNOSIS — R0781 Pleurodynia: Secondary | ICD-10-CM

## 2020-07-15 DIAGNOSIS — R1011 Right upper quadrant pain: Secondary | ICD-10-CM

## 2020-07-15 DIAGNOSIS — Z20822 Contact with and (suspected) exposure to covid-19: Secondary | ICD-10-CM | POA: Insufficient documentation

## 2020-07-15 DIAGNOSIS — R918 Other nonspecific abnormal finding of lung field: Secondary | ICD-10-CM

## 2020-07-15 DIAGNOSIS — K7689 Other specified diseases of liver: Secondary | ICD-10-CM

## 2020-07-15 DIAGNOSIS — R109 Unspecified abdominal pain: Secondary | ICD-10-CM | POA: Insufficient documentation

## 2020-07-15 DIAGNOSIS — R162 Hepatomegaly with splenomegaly, not elsewhere classified: Secondary | ICD-10-CM | POA: Insufficient documentation

## 2020-07-15 DIAGNOSIS — J90 Pleural effusion, not elsewhere classified: Secondary | ICD-10-CM | POA: Insufficient documentation

## 2020-07-15 DIAGNOSIS — Z8505 Personal history of malignant neoplasm of liver: Secondary | ICD-10-CM | POA: Insufficient documentation

## 2020-07-15 DIAGNOSIS — R079 Chest pain, unspecified: Secondary | ICD-10-CM

## 2020-07-15 DIAGNOSIS — R921 Mammographic calcification found on diagnostic imaging of breast: Secondary | ICD-10-CM | POA: Insufficient documentation

## 2020-07-15 LAB — CBC WITH DIFF, BLOOD
ANC-Automated: 4.5 10*3/uL (ref 1.6–7.0)
Abs Basophils: 0 10*3/uL (ref <0.1)
Abs Eosinophils: 0.1 10*3/uL (ref 0.0–0.5)
Abs Lymphs: 1.3 10*3/uL (ref 0.8–3.1)
Abs Monos: 0.5 10*3/uL (ref 0.2–0.8)
Basophils: 0 %
Eosinophils: 1 %
Hct: 41.9 % (ref 34.0–45.0)
Hgb: 13.9 gm/dL (ref 11.2–15.7)
Lymphocytes: 20 %
MCH: 29.4 pg (ref 26.0–32.0)
MCHC: 33.2 g/dL (ref 32.0–36.0)
MCV: 88.6 um3 (ref 79.0–95.0)
MPV: 10 fL (ref 9.4–12.4)
Monocytes: 8 %
Plt Count: 195 10*3/uL (ref 140–370)
RBC: 4.73 10*6/uL (ref 3.90–5.20)
RDW: 12.9 % (ref 12.0–14.0)
Segs: 71 %
WBC: 6.3 10*3/uL (ref 4.0–10.0)

## 2020-07-15 LAB — ECG 12-LEAD
ATRIAL RATE: 86 {beats}/min
ECG INTERPRETATION: NORMAL
P AXIS: 62 degrees
PR INTERVAL: 166 ms
QRS INTERVAL/DURATION: 82 ms
QT: 370 ms
QTc (Bazett): 442 ms
R AXIS: 6 degrees
T AXIS: -9 degrees
VENTRICULAR RATE: 86 {beats}/min

## 2020-07-15 LAB — PROTHROMBIN TIME, BLOOD
INR: 1.1
PT,Patient: 12.2 s (ref 9.7–12.5)

## 2020-07-15 LAB — URINALYSIS WITH CULTURE REFLEX, WHEN INDICATED
Bilirubin: NEGATIVE
Blood: NEGATIVE
Glucose: NEGATIVE
Ketones: NEGATIVE
Leuk Esterase: 75 Leu/uL — AB
Nitrite: NEGATIVE
Protein: NEGATIVE
Specific Gravity: 1.004 (ref 1.002–1.030)
Urobilinogen: NEGATIVE
pH: 6.5 (ref 5.0–8.0)

## 2020-07-15 LAB — COMPREHENSIVE METABOLIC PANEL, BLOOD
ALT (SGPT): 34 U/L — ABNORMAL HIGH (ref 0–33)
AST (SGOT): 24 U/L (ref 0–32)
Albumin: 4.8 g/dL (ref 3.5–5.2)
Alkaline Phos: 155 U/L — ABNORMAL HIGH (ref 35–140)
Anion Gap: 12 mmol/L (ref 7–15)
BUN: 8 mg/dL (ref 8–23)
Bicarbonate: 27 mmol/L (ref 22–29)
Bilirubin, Tot: 0.56 mg/dL (ref <1.2)
Calcium: 9.7 mg/dL (ref 8.5–10.6)
Chloride: 101 mmol/L (ref 98–107)
Creatinine: 0.49 mg/dL — ABNORMAL LOW (ref 0.51–0.95)
GFR: >60 mL/min
Glucose: 134 mg/dL — ABNORMAL HIGH (ref 70–99)
Potassium: 4.3 mmol/L (ref 3.5–5.1)
Sodium: 140 mmol/L (ref 136–145)
Total Protein: 8.1 g/dL — ABNORMAL HIGH (ref 6.0–8.0)

## 2020-07-15 LAB — INFLUENZA A/B & SARS-COV-2 PCR COMBO FOR RAPID RESPONSE LAB
Influenza A PCR, RRL: NOT DETECTED
Influenza B PCR, RRL: NOT DETECTED
SARS-CoV-2 PCR, RRL: NOT DETECTED

## 2020-07-15 LAB — APTT, BLOOD: PTT: 31 s (ref 27–36)

## 2020-07-15 LAB — HIV 1/2 ANTIBODY & P24 ANTIGEN ASSAY, BLOOD: HIV 1/2 Antibody & P24 Antigen Assay: NONREACTIVE

## 2020-07-15 LAB — LIPASE, BLOOD: Lipase: 27 U/L (ref 13–60)

## 2020-07-15 LAB — HCV ANTIBODY WITH REFLEX QUANT: Hepatitis C Ab: NONREACTIVE

## 2020-07-15 MED ORDER — KETOROLAC TROMETHAMINE 30 MG/ML IJ SOLN
15.0000 mg | Freq: Once | INTRAMUSCULAR | Status: AC
Start: 2020-07-15 — End: 2020-07-15
  Administered 2020-07-15: 15 mg via INTRAVENOUS
  Filled 2020-07-15: qty 1

## 2020-07-15 MED ORDER — HYDROCODONE-ACETAMINOPHEN 5-325 MG OR TABS
1.0000 | ORAL_TABLET | Freq: Four times a day (QID) | ORAL | 0 refills | Status: DC | PRN
Start: 2020-07-15 — End: 2020-10-21

## 2020-07-15 MED ORDER — IOHEXOL 350 MG/ML IV SOLN
80.0000 mL | Freq: Once | INTRAVENOUS | Status: AC
Start: 2020-07-15 — End: 2020-07-15
  Administered 2020-07-15 (×2): 100 mL via INTRAVENOUS
  Filled 2020-07-15: qty 80

## 2020-07-15 MED ORDER — LACTATED RINGERS IV SOLN
INTRAVENOUS | Status: DC
Start: 2020-07-15 — End: 2020-07-15

## 2020-07-15 NOTE — ED Notes (Signed)
Returned from CT to room.

## 2020-07-15 NOTE — ED EKG Interpretation (Signed)
ED EKG Interpretation    EKG: Normal Sinus Rhythm and rate 86 with Normal Axis and nonspecific ST and T wave changes and no ischemic changes.

## 2020-07-15 NOTE — ED Provider Notes (Signed)
Emergency Department Note    CC :   Chief Complaint   Patient presents with    Abdominal Pain     patient with a hx. of pancreatic [place] c/o RUQ abdominal pain. 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     Abdominal pain started on Saturday (1/22) afternoon. Patient was walking up the stairs at home and felt a "tinge" on the right abdomen near the lower rib cage. The pain radiates from her "bra line to the waist" along her right flank. On Saturday night, pain became severe and sharp, while laying in bed. There has been a constant dull pain that is intermittently sharp and worse with any movement (going up the stairs, turning her body). She has been using advil to manage her pain at home. Pain is not worsened by eating. Patient has not had a bowel movement in 2 days.    Patient denies any nausea, vomiting, hemoptysis, diarrhea, blurry vision, light-headedness and palpitations.      Past Medical History :  Active Ambulatory Problems     Diagnosis Date Noted    Primary pancreatic neuroendocrine tumor 05/05/2019    Liver metastases (CMS-HCC) 05/05/2019    Carcinoid syndrome (CMS-HCC) 05/05/2019    Diarrhea, unspecified type 05/05/2019       Past Surgical history :   No past surgical history on file.       What To Do With Your Medications      START taking these medications      Add'l Info   HYDROcodone-acetaminophen 5-325 MG tablet  Commonly known as: NORCO  Take 1 tablet by mouth every 6 hours as needed for Severe Pain (Pain Score 7-10).   Quantity: 5 tablet  Refills: 0        CONTINUE taking these medications      Add'l Info   octreotide 30 MG injection  Commonly known as: SANDOSTATIN LAR  30 mg by IntraMUSCULAR route every 28 days.   Refills: 0           Where to Get Your Medications      Please check with staff for printed prescription or if prescription was  faxed to your pharmacy.    Bring a paper prescription for each of these medications   HYDROcodone-acetaminophen 5-325 MG tablet         Allergies :  Patient has no known allergies.    Social History :   Social History     Socioeconomic History    Marital status: Married     Spouse name: Not on file    Number of children: Not on file    Years of education: Not on file    Highest education level: Not on file   Occupational History    Not on file   Tobacco Use    Smoking status: Never Smoker    Smokeless tobacco: Never Used   Substance and Sexual Activity    Alcohol use: Not Currently    Drug use: Not on file    Sexual activity: Not on file   Other Topics Concern    Not on file   Social History Narrative    Not on file     Social Determinants of Health     Financial Resource Strain: Not on file  Food Insecurity: Not on file   Transportation Needs: Not on file   Physical Activity: Not on file   Stress: Not on file   Social Connections: Not on file   Intimate Partner Violence: Not on file   Housing Stability: Not on file         Family history  :  No family history on file.      Review of Systems    Constitutional: Negative for fever.   HENT: Negative for sore throat.   Eyes: Negative for discharge.   Respiratory: Shortness of breath during severe pain. Denies cough.  Cardiovascular: Negative for chest pain and palpitations.   Gastrointestinal: Negative for nausea/vomiting/hemoptysis. Positive for right-sided abdominal flank pain.  Genitourinary: Negative for difficulty urinating.   Musculoskeletal: neg for acute trauma   Skin: Negative for rash, wounds, and bruising.   Neurological: Negative for dysarthria.   Psychiatric/Behavioral: Negative for suicidal ideas.         Physical Exam :    07/15/20  0659 07/15/20  0952 07/15/20  0954 07/15/20  1243   BP: 149/90 124/63 124/63 122/60   Pulse: 90 89 89 87   Resp: '20 18 16 16   ' Temp: 99 F (37.2 C)   98.5 F (36.9 C)   SpO2: 97% 95% 97% 97%     Vitals signs  noted and reviewed.  GENERAL APPEARANCE: Well appearing, A&O x 3  HEENT: no trauma.  PERRL, mucous membranes dry   NECK:   No midline cervical spine ttp, no meningeal signs  CARDIAC:  RRR, no murmurs appreciated  CHEST : no deformity or TTP  LUNGS: CTAB with no W/R/R.  Good effort   ABDOMEN: TTP on RUQ, Soft, no distention.   BACK:  Flank tenderness, no midline t or l spine ttp   EXTREMITIES: No significant deformity or joint abnormality. No edema.  NEUROLOGICAL: CN II-XII grossly intact. Moving all extremities, following commands appropriately, gait normal  SKIN:    Warm and dry  PSYCHIATRIC: A&Ox3.  Normal affect.  No SI/HI/AH/VH          LABS  Results for orders placed or performed during the hospital encounter of 07/15/20   CMP   Result Value Ref Range    Glucose 134 (H) 70 - 99 mg/dL    BUN 8 8 - 23 mg/dL    Creatinine 0.49 (L) 0.51 - 0.95 mg/dL    GFR >60 mL/min    Sodium 140 136 - 145 mmol/L    Potassium 4.3 3.5 - 5.1 mmol/L    Chloride 101 98 - 107 mmol/L    Bicarbonate 27 22 - 29 mmol/L    Anion Gap 12 7 - 15 mmol/L    Calcium 9.7 8.5 - 10.6 mg/dL    Total Protein 8.1 (H) 6.0 - 8.0 g/dL    Albumin 4.8 3.5 - 5.2 g/dL    Bilirubin, Tot 0.56 <1.2 mg/dL    AST (SGOT) 24 0 - 32 U/L    ALT (SGPT) 34 (H) 0 - 33 U/L    Alkaline Phos 155 (H) 35 - 140 U/L   HIV 1/2 Antibody & P24 Antigen Assay   Result Value Ref Range    HIV 1/2 Antibody & P24 Antigen Assay Non Reactive    CBC w/ Diff Lavender   Result Value Ref Range    WBC 6.3 4.0 - 10.0 1000/mm3    RBC 4.73 3.90 - 5.20 mill/mm3    Hgb 13.9 11.2 -  15.7 gm/dL    Hct 41.9 34.0 - 45.0 %    MCV 88.6 79.0 - 95.0 um3    MCH 29.4 26.0 - 32.0 pgm    MCHC 33.2 32.0 - 36.0 g/dL    RDW 12.9 12.0 - 14.0 %    MPV 10.0 9.4 - 12.4 fL    Plt Count 195 140 - 370 1000/mm3    Segs 71 %    Lymphocytes 20 %    Monocytes 8 %    Eosinophils 1 %    Basophils 0 %    ANC-Automated 4.5 1.6 - 7.0 1000/mm3    Abs Lymphs 1.3 0.8 - 3.1 1000/mm3    Abs Monos 0.5 0.2 - 0.8 1000/mm3    Abs  Eosinophils 0.1 0.0 - 0.5 1000/mm3    Abs Basophils 0.0 <0.1 1000/mm3    Diff Type Automated    Lipase, Blood Green Plasma Separator Tube   Result Value Ref Range    Lipase 27 13 - 60 U/L   aPTT, Blood Blue   Result Value Ref Range    PTT 31 27 - 36 sec   Prothrombin Time, Blood Blue   Result Value Ref Range    PT,Patient 12.2 9.7 - 12.5 sec    INR 1.1    Urinalysis with Culture Reflex, when indicated    Specimen: Clean Catch; Urine   Result Value Ref Range    Type Clean catch     Color Colorless Yellow    Appearance Clear Clear    Specific Gravity 1.004 1.002 - 1.030    pH 6.5 5.0 - 8.0    Protein Negative Negative    Glucose Negative Negative    Ketones Negative Negative    Bilirubin Negative Negative    Blood Negative Negative    Urobilinogen Negative Negative    Nitrite Negative Negative    Leuk Esterase 75 (A) Negative Leu/uL    WBC 0-2 0-2/HPF    RBC 0-2 0-2/HPF    Bacteria None None-Rare/HPF    Squam. Epithelial Cell 0-5(RARE) <6-10(FEW)   ECG 12 Lead   Result Value Ref Range    VENTRICULAR RATE 86 BPM    ATRIAL RATE 86 BPM    PR INTERVAL 166 ms    QRS INTERVAL/DURATION 82 ms    QT 370 ms    QTC INTERVAL 442 ms    P AXIS 62 degrees    R AXIS 6 degrees    T AXIS -9 degrees    ECG INTERPRETATION       Normal sinus rhythm  Low voltage QRS, consider pulmonary disease, pericardial effusion, or normal   variant  Cannot rule out Inferior infarct , age undetermined  Abnormal ECG    Confirmed by Computer result only. For MD analysis, see ED notes. (204),   editor HADNOT, ANTHONY ([id number]) on 07/15/2020 9:53:09 AM     HCV Antibody with Reflex Quant 2 Serum Separator Tubes    Specimen: Blood   Result Value Ref Range    Hepatitis C Ab Non Reactive    Influenza A/B & SARS-CoV-2 PCR Combo for Rapid Response Lab Nasal-Pharyngeal    Specimen: Nasal-Pharyngeal   Result Value Ref Range    SARS-CoV-2 PCR, RRL Not Detected Not Detected    Influenza A PCR, RRL Not Detected Not Detected    Influenza B PCR, RRL Not Detected Not Detected    Urine Culture Urine    Specimen: Urine   Result Value Ref Range  Urine Culture Result <10,000 CFU/mL Gram Positive Flora          DIAGNOSTIC STUDIES  No results found for this visit on 07/15/20.        Clinical Decision Making   65 yo Female with pancreatic neuroendocrine tumor presents w/ 1 day of severe, intermittent abdominal pain.  Presentation likely nephrolithiasis.   Suspect cholelithiasis or possible biliary obstruction due to hx of pancreatic cancer.  Low concern for pulmonary embolism, pancreatitis, hepatitis, acute mesenteric occlusion.  Plan for PIV, Labs, UA, CXR, CT pelvis w/o contrast      ED course   Labs notable for    Results for orders placed or performed during the hospital encounter of 07/15/20   CMP   Result Value Ref Range    Glucose 134 (H) 70 - 99 mg/dL    BUN 8 8 - 23 mg/dL    Creatinine 0.49 (L) 0.51 - 0.95 mg/dL    GFR >60 mL/min    Sodium 140 136 - 145 mmol/L    Potassium 4.3 3.5 - 5.1 mmol/L    Chloride 101 98 - 107 mmol/L    Bicarbonate 27 22 - 29 mmol/L    Anion Gap 12 7 - 15 mmol/L    Calcium 9.7 8.5 - 10.6 mg/dL    Total Protein 8.1 (H) 6.0 - 8.0 g/dL    Albumin 4.8 3.5 - 5.2 g/dL    Bilirubin, Tot 0.56 <1.2 mg/dL    AST (SGOT) 24 0 - 32 U/L    ALT (SGPT) 34 (H) 0 - 33 U/L    Alkaline Phos 155 (H) 35 - 140 U/L   HIV 1/2 Antibody & P24 Antigen Assay   Result Value Ref Range    HIV 1/2 Antibody & P24 Antigen Assay Non Reactive    CBC w/ Diff Lavender   Result Value Ref Range    WBC 6.3 4.0 - 10.0 1000/mm3    RBC 4.73 3.90 - 5.20 mill/mm3    Hgb 13.9 11.2 - 15.7 gm/dL    Hct 41.9 34.0 - 45.0 %    MCV 88.6 79.0 - 95.0 um3    MCH 29.4 26.0 - 32.0 pgm    MCHC 33.2 32.0 - 36.0 g/dL    RDW 12.9 12.0 - 14.0 %    MPV 10.0 9.4 - 12.4 fL    Plt Count 195 140 - 370 1000/mm3    Segs 71 %    Lymphocytes 20 %    Monocytes 8 %    Eosinophils 1 %    Basophils 0 %    ANC-Automated 4.5 1.6 - 7.0 1000/mm3    Abs Lymphs 1.3 0.8 - 3.1 1000/mm3    Abs Monos 0.5 0.2 - 0.8 1000/mm3    Abs Eosinophils 0.1  0.0 - 0.5 1000/mm3    Abs Basophils 0.0 <0.1 1000/mm3    Diff Type Automated    Lipase, Blood Green Plasma Separator Tube   Result Value Ref Range    Lipase 27 13 - 60 U/L   aPTT, Blood Blue   Result Value Ref Range    PTT 31 27 - 36 sec   Prothrombin Time, Blood Blue   Result Value Ref Range    PT,Patient 12.2 9.7 - 12.5 sec    INR 1.1    Urinalysis with Culture Reflex, when indicated    Specimen: Clean Catch; Urine   Result Value Ref Range    Type Clean catch     Color  Colorless Yellow    Appearance Clear Clear    Specific Gravity 1.004 1.002 - 1.030    pH 6.5 5.0 - 8.0    Protein Negative Negative    Glucose Negative Negative    Ketones Negative Negative    Bilirubin Negative Negative    Blood Negative Negative    Urobilinogen Negative Negative    Nitrite Negative Negative    Leuk Esterase 75 (A) Negative Leu/uL    WBC 0-2 0-2/HPF    RBC 0-2 0-2/HPF    Bacteria None None-Rare/HPF    Squam. Epithelial Cell 0-5(RARE) <6-10(FEW)   ECG 12 Lead   Result Value Ref Range    VENTRICULAR RATE 86 BPM    ATRIAL RATE 86 BPM    PR INTERVAL 166 ms    QRS INTERVAL/DURATION 82 ms    QT 370 ms    QTC INTERVAL 442 ms    P AXIS 62 degrees    R AXIS 6 degrees    T AXIS -9 degrees    ECG INTERPRETATION       Normal sinus rhythm  Low voltage QRS, consider pulmonary disease, pericardial effusion, or normal   variant  Cannot rule out Inferior infarct , age undetermined  Abnormal ECG    Confirmed by Computer result only. For MD analysis, see ED notes. (204),   editor HADNOT, ANTHONY ([id number]) on 07/15/2020 9:53:09 AM     HCV Antibody with Reflex Quant 2 Serum Separator Tubes    Specimen: Blood   Result Value Ref Range    Hepatitis C Ab Non Reactive    Influenza A/B & SARS-CoV-2 PCR Combo for Rapid Response Lab Nasal-Pharyngeal    Specimen: Nasal-Pharyngeal   Result Value Ref Range    SARS-CoV-2 PCR, RRL Not Detected Not Detected    Influenza A PCR, RRL Not Detected Not Detected    Influenza B PCR, RRL Not Detected Not Detected   Urine Culture  Urine    Specimen: Urine   Result Value Ref Range    Urine Culture Result <10,000 CFU/mL Gram Positive Flora      CTA Pulmonary Embolus   Final Result   IMPRESSION:   Mild motion artifacts noted. No evidence of pulmonary embolus to the distal subsegmental level.      Trace nonspecific right pleural thickening and/or effusion. Given the presence of probable hepatic metastatic disease, underlying pleural disease is difficult to exclude. Close attention on follow-up imaging recommended. Associated bibasal opacities most likely reflect atelectatic change. Superimposed infection is felt less likely.      Several scattered nonspecific less than 4 mm pulmonary nodules. Without comparison studies available underlying metastatic disease is not excluded. Continued attention on follow-up imaging recommended.      Breast calcifications as above. Correlation with mammography is recommended.      Additional ancillary findings as above. Please refer to same day CT abdomen for description of additional findings below the diaphragm.         X-Ray Chest Frontal And Lateral   Final Result   IMPRESSION:   Query subtle right pleural effusion or thickening. This is better defined on subsequent same-day CT chest. No dense airspace consolidations otherwise noted.         CT Abdomen And Pelvis W/O Contrast   Final Result   IMPRESSION:   CT scan of the abdomen and pelvis without IV contrast.      No acute abnormalities within the abdomen or pelvis. No hydronephrosis and no nephrolithiasis bilaterally.  Normal appendix. Note that assessment of solid organs is limited in the absence of intravenous contrast.      History of pancreatic neuroendocrine tumor, with a likely pancreatic body/tail mass and multiple hypoattenuating liver lesions, suboptimally characterized without intravenous contrast.      Mild hepatosplenomegaly. Mild hepatic contour nodularity. Recommend clinical correlation for diffuse liver disease.      Prominent perisplenic and  omental collateral vessels, which can be seen with portal hypertension.      Small right pleural effusion. Bilateral basal atelectasis or scarring.             Plan: Given patient presentation is only remarkable for intermittent, right flank pain, the differential includes nephrolithiasis, cholelithiasis, pulmonary embolism, pancreatitis, hepatitis, and acute mesenteric occlusion.The intermittent nature of the severe pain localized to her right flank and radiating towards her back is more likely to be nephrolithiasis. A UA and CT pelvis w/o contrast were remarkable for findings in the results section, negative for emergent patholgy. Patient is not currently in distress and pain is not exacerbated with meals. These characteristics along with a normal lipase and mildly elevated ALT and Alk phos make a GI etiology unlikely. Normal vitals with normal O2 sat make a PE unlikely. Toradol 39m was given for pain management.      ED precautions reviewed. Pt verbalized understanding of plan and had no further questions at time of DC.     This note was written in collaboration [facility] [place]  My thought process and decision making were discussed with the attending Dr. SBurnadette Peter     SAllena Katz MD  08/21/20 [phone]

## 2020-07-15 NOTE — ED Notes (Signed)
NP to bedside

## 2020-07-15 NOTE — ED Notes (Signed)
Dr Shishlov at bedside

## 2020-07-15 NOTE — Discharge Instructions (Signed)
Use tylenol/ ibuprofen for your pain and Norco as needed for severe pain. Follow up with Dr. Ane Payment (he is trying to see you on Thursday). Return to the ED for any acutely worsening symptoms.

## 2020-07-15 NOTE — ED Notes (Signed)
Patient to CT for PE study.

## 2020-07-15 NOTE — ED Notes (Signed)
Rx for Norco given to pt, information on abdominal pain given. Pt to f/u with pmd 1/27

## 2020-07-15 NOTE — Telephone Encounter (Signed)
Jenna Bridges is a patient of Dr. Ane Payment    Diagnosis: metastatic PNET   Current Treatment: octreotide    I received a call from [facility].     Reports a cramp in her right side on Saturday. This got worse yesterday. It has kept her up all night. Pain improved with advil, but this is no longer sufficient. She described it as a constant pain that gets worse with any movement. The pain is right between her breast and waist, possibly ribs. Pain is not influenced by oral intake.  She reports no nausea, vomiting, diarrhea or any other symptom.     I advised that she go to the emergency department for further evaluation and pain control. She plans to have her husband drive her to [facility] ED.

## 2020-07-15 NOTE — ED Notes (Signed)
Patient to CT.

## 2020-07-15 NOTE — ED Notes (Signed)
Pt c/o  RUQ pain, hx of pancreatic [place]

## 2020-07-16 ENCOUNTER — Telehealth (HOSPITAL_BASED_OUTPATIENT_CLINIC_OR_DEPARTMENT_OTHER): Payer: Self-pay

## 2020-07-16 NOTE — Telephone Encounter (Signed)
Voicemail left for patient informing her that although she had a CT in the ER on 1/24, Dr. Ane Payment would still like her to move forward with the MRI. Call back number to [facility] provided should she have any further questions.

## 2020-07-17 LAB — URINE CULTURE: Urine Culture Result: <10000

## 2020-07-18 ENCOUNTER — Ambulatory Visit: Payer: TRICARE Prime—HMO | Attending: Hematology & Oncology | Admitting: Hematology & Oncology

## 2020-07-18 DIAGNOSIS — R918 Other nonspecific abnormal finding of lung field: Secondary | ICD-10-CM | POA: Insufficient documentation

## 2020-07-18 DIAGNOSIS — C787 Secondary malignant neoplasm of liver and intrahepatic bile duct: Secondary | ICD-10-CM | POA: Insufficient documentation

## 2020-07-18 DIAGNOSIS — E340 Carcinoid syndrome: Secondary | ICD-10-CM | POA: Insufficient documentation

## 2020-07-18 DIAGNOSIS — D3A8 Other benign neuroendocrine tumors: Secondary | ICD-10-CM | POA: Insufficient documentation

## 2020-07-23 ENCOUNTER — Ambulatory Visit
Admission: RE | Admit: 2020-07-23 | Discharge: 2020-07-24 | Disposition: A | Discharge status: Home / Self Care | Payer: TRICARE Prime—HMO | Attending: Hematology & Oncology | Admitting: Hematology & Oncology

## 2020-07-23 ENCOUNTER — Telehealth (HOSPITAL_BASED_OUTPATIENT_CLINIC_OR_DEPARTMENT_OTHER): Payer: Self-pay | Admitting: Hematology & Oncology

## 2020-07-23 VITALS — BP 135/78 | HR 76 | Temp 97.3°F | Resp 16 | Ht 65.16 in | Wt 153.2 lb

## 2020-07-23 DIAGNOSIS — E340 Carcinoid syndrome: Secondary | ICD-10-CM | POA: Insufficient documentation

## 2020-07-23 DIAGNOSIS — D3A8 Other benign neuroendocrine tumors: Secondary | ICD-10-CM | POA: Insufficient documentation

## 2020-07-23 DIAGNOSIS — C787 Secondary malignant neoplasm of liver and intrahepatic bile duct: Secondary | ICD-10-CM | POA: Insufficient documentation

## 2020-07-23 MED ORDER — OCTREOTIDE ACETATE 30 MG IM KIT
30.0000 mg | PACK | Freq: Once | INTRAMUSCULAR | Status: AC
Start: 2020-07-23 — End: 2020-07-23
  Administered 2020-07-23 (×2): 30 mg via INTRAMUSCULAR
  Filled 2020-07-23: qty 1

## 2020-07-23 NOTE — Interdisciplinary (Signed)
Patient in for Octreotide.  Vital signs stable and in no apparent distress.  Pt reports no nausea, vomiting, constipation, or diarrhea.  Pt given ongoing medication teaching.    Octreotide given IM in the LEFT gluteus.   Patient tolerated injection.    Patient discharged to home in no apparent distress. Return to clinic in 4 week(s) for Octreotide

## 2020-07-23 NOTE — Telephone Encounter (Signed)
OK for octreotide

## 2020-07-25 NOTE — Progress Notes (Signed)
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 Veverly Fells at [place] on octreotide since 08/2017. Previous to this noted watery diarrhea and had increase from $RemoveBefor'20mg'RitkiNOnWNfH$  to $R'30mg'UL$  with good resolution. Has had no abdominal pain or carcinoid symptoms. Recent CT CAP and MRI of pancreas October 2020 found new hepatic lesions and slight interval growth compared with 1 year prior.    She is here to discuss additional treatment options for her and overall prognosis.     05/22/2020: presents alone. States that the [facility] oncology practice is 'closing' and that she wants to transfer care to [place]. Has report from October that says there is progression on her MRI scans but they continued on octreotide $RemoveBefor'30mg'ysAQbnbACNmN$  as she was clinically well. Otherwise notes diarrhea if her injection is delayed. Is set to get last injection at [facility] on 05/28/2020 then will need 06/2020 injection from that point forward.     07/18/2020: presents alone. Recent ED visit (1/24) with abdominal pain on right abdomen subcostal, severe when lying down. Restaged at that time showing minor right pleural thickening above right hepatic disease. Scattered 39mm nodules.No pain has returned, no carcinoid issues.    Review of systems: No abdominal pain, flushing, pain has resolved, Has diarrhea if delay in injection otherwise, all other systems reviewed and are negative except for what is described in the history of present illness.    Oncologic Timeline:  09/09/2017-04/06/2019: Octreotide Qmonthly  04/27/2019: [facility]  03/2020: Per report, progression on MRI, continues on octreotide $RemoveBefor'30mg'gbsQtoVmUhud$   monthly  05/22/2020: F/U Dr. Ane Payment  07/15/2020: ED visit with pain, CTA negative  07/18/2020: RV Dr. Ane Payment    -----------------------------  Past Medical History: No past medical history on file.    Medications:   Current Outpatient Medications:     HYDROcodone-acetaminophen (NORCO) 5-325 MG tablet, Take 1 tablet by mouth every 6 hours as needed for Severe Pain (Pain Score 7-10)., Disp: 5 tablet, Rfl: 0    octreotide (SANDOSTATIN LAR) 30 MG injection, 30 mg by IntraMUSCULAR route every 28 days., Disp: , Rfl:     Allergies: No Known Allergies    Social history:   Alcohol: None  Tobacco: None  Drugs: None  Living Situation: Lives with husband, daughter is a PhD Personal assistant at [employer] history:  Father: Parkinson/Lewy Body Dementia  Cousins: breast, brain, colon cancer and lymphoma    -----------------------------  Physical exam:  Vitals:There were no vitals taken for this visit.  Wt Readings from Last 3 Encounters:   07/23/20 69.5 kg (153 lb 3.5 oz)   07/15/20 69.9 kg (154 lb)   06/25/20 69.7 kg (153 lb 10.6 oz)     Video Visit Physical Exam (unchanged)  ECOG: 1  GENERAL: Talkative, speaking in complete sentences, patient self-identified  EYES: PERRLA, EOMI, sclera anicteric  ENT: No focal deficit or nasolabial droop. Lips moist with appropriate coloration  NECK: No thyromegaly or visible goiter  LYMPH NODES: No appreciable observable lymphadenopathy  CARDIOVASCULAR: Equal chest rise on observation, appropriate skin tone and vascular  coloration  LUNGS: Equal diaphragmatic rise, speaking in full sentences, no audible wheeze or upper respiratory sounds  GASTROINTESTINAL: No distension appreciated, on self-palpation, no focal pains  EXTREMITIES: No edema, no deformity.  SKIN: Warm, dry, no rashes or lesions.   NEURO: Cranial nerves II through XII intact, no focal deficit in upper or lower extremity strength or coordination. Completing full sentences and AOx3    -----------------------------  Labs: Reviewed  by myself  Lab Results   Component Value Date    WBC 6.3 07/15/2020    RBC 4.73 07/15/2020    HGB 13.9 07/15/2020    HCT 41.9 07/15/2020    MCV 88.6 07/15/2020    MCHC 33.2 07/15/2020    RDW 12.9 07/15/2020    PLT 195 07/15/2020    MPV 10.0 07/15/2020     Lab Results   Component Value Date    BUN 8 07/15/2020    CREAT 0.49 (L) 07/15/2020    CL 101 07/15/2020    NA 140 07/15/2020    K 4.3 07/15/2020     9.7 07/15/2020    TBILI 0.56 07/15/2020    ALB 4.8 07/15/2020    TP 8.1 (H) 07/15/2020    AST 24 07/15/2020    ALK 155 (H) 07/15/2020    BICARB 27 07/15/2020    ALT 34 (H) 07/15/2020    GLU 134 (H) 07/15/2020     Pancreatic Polypeptide 07/2017: 837  Chromogranin A 07/2017: 1  CA19-9 07/2017: 8  Alk Phos 11/2018: 176    Colonoscopy    Radiology: Reviewed by myself in conjunction with radiology report  CTA Pulmonary Embolus    Result Date: 07/15/2020  IMPRESSION: Mild motion artifacts noted. No evidence of pulmonary embolus to the distal subsegmental level. Trace nonspecific right pleural thickening and/or effusion. Given the presence of probable hepatic metastatic disease, underlying pleural disease is difficult to exclude. Close attention on follow-up imaging recommended. Associated bibasal opacities most likely reflect atelectatic change. Superimposed infection is felt less likely. Several scattered nonspecific less than 4 mm pulmonary nodules. Without comparison studies available underlying metastatic disease is not excluded. Continued attention on follow-up imaging recommended. Breast calcifications as above. Correlation with mammography is recommended. Additional ancillary findings as above. Please refer to same day CT abdomen for description of additional findings below the diaphragm.     X-Ray Chest Frontal And Lateral    Result Date: 07/15/2020  IMPRESSION: Query subtle right pleural effusion or thickening. This is better defined on subsequent same-day CT chest. No dense airspace consolidations otherwise noted.      CT Abdomen And Pelvis W/O Contrast    Result Date: 07/15/2020  IMPRESSION: CT scan of the abdomen and pelvis without IV contrast. No acute abnormalities within the abdomen or pelvis. No hydronephrosis and no nephrolithiasis bilaterally. Normal appendix. Note that assessment of solid organs is limited in the absence of intravenous contrast. History of pancreatic neuroendocrine tumor, with a likely pancreatic body/tail mass and multiple hypoattenuating liver lesions, suboptimally characterized without intravenous contrast. Mild hepatosplenomegaly. Mild hepatic contour nodularity. Recommend clinical correlation for diffuse liver disease. Prominent perisplenic and omental collateral vessels, which can be seen with portal hypertension. Small right pleural effusion. Bilateral basal atelectasis or scarring.     03/2020 [facility] Report      04/04/2019 MRI Pancreas    CT Chest 03/27/2019  - No evidence of thoracic metastatic disease    CT A/P 03/24/2019    PET Gallium 68  - Primary pancreatic mass with  intense radiotracer uptake and multiple hepatic metastatic lesions    Pathology:    Ki67 3%    Molecular:    PDL1 0%      -----------------------------  Assessment & Plan:  Jenna Bridges is a 65 year old female presenting with metastatic PNET with progression on $RemoveBefore'30mg'OotmbNyxdnGhU$  Octreotide Monthly.    We discussed with the patient and their family the natural course of NET. We discussed how many of these come from GI sources including the pancreas, colon, and stomach. Occasionally they are seen from the lung as well. We discussed that they can be indolent and slow growing or quickly capable of rapid growth and spread. Treatment options for symptom relief include somatostatin analogues. Although fewer than 10% of patients see tumor shrinkage with these drugs, prolonged stable disease is seen in 66% which delays progression.     We discussed that if there was interval growth on the next imaging further options include everolimus, CAPTEM, TKIs  (Sunitinib, sorafenib, pazopanib, and cabozantinib) and Lutathera. We will also evaluate the ability to send tissue for NGS today with [facility]    Ultimately we would continue octreotide at this time and add everolimus. We discussed evaluating in 2 months to see if growth stopped or continued. After this we would then recommend either CAPTEM or Lutathera. We discussed the side effects dermatologically of everolimus and instructed the patient to pre-empt the rash with a good face hygiene regimen.     Would have an RV in January to discuss restaging scans.    05/22/2020: We will port her scans in from [employer] and restage now given 3 month interval. She previously had a PET GA68 to follow as well for [place]. We discussed increasing octreotide or adding regorafenib/everolimus in addition next. We will schedule her for her 06/2020 injection as well to reduce her diarrheal burden. RV monthly until stability. NGS completed as above and added to chart. 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 We discussed MEN can be used as a screening tool for future cancers and she will talk to them but doesn't want to make decision now.    07/18/2020: pain may be referred from hepatic disease, no obvious right effusion needing thoracentesis. Pain has resolved and no diarrheal symptoms. Would recommend quick interval scan in 2 months time, mid March. Continue injections of octreotide.      ICD-10-CM ICD-9-CM   1. Primary pancreatic neuroendocrine tumor  D3A.8 209.69   2. Liver metastases (CMS-HCC)  C78.7 197.7   3. Carcinoid syndrome (CMS-HCC)  E34.0 259.2   4. Lung nodules  R91.8 793.19     Therapeutic consent is obtained with all patients prior to the first infusion visit or dose. We discuss alternative treatments, expected side effects, and rare occurrences. Further the patient and I discuss  the expected outcomes as well as contact number for anyone on our team.    No LOS data to display      Margreta Journey, MD/PhD  [place]  -----------------------------  Lab No orders of the defined types were placed in this encounter.    Imaging   No orders of the defined types were placed in this encounter.    Procedures   No orders of the defined types were placed in this encounter.    Other No orders of the defined types  were placed in this encounter.

## 2020-08-11 ENCOUNTER — Ambulatory Visit
Admission: RE | Admit: 2020-08-11 | Discharge: 2020-08-11 | Disposition: A | Discharge status: Home / Self Care | Payer: TRICARE Prime—HMO | Attending: Hematology & Oncology | Admitting: Hematology & Oncology

## 2020-08-11 DIAGNOSIS — K6389 Other specified diseases of intestine: Secondary | ICD-10-CM

## 2020-08-11 DIAGNOSIS — I864 Gastric varices: Secondary | ICD-10-CM

## 2020-08-11 DIAGNOSIS — R9389 Abnormal findings on diagnostic imaging of other specified body structures: Secondary | ICD-10-CM

## 2020-08-11 DIAGNOSIS — D3A8 Other benign neuroendocrine tumors: Secondary | ICD-10-CM | POA: Insufficient documentation

## 2020-08-11 DIAGNOSIS — C7A8 Other malignant neuroendocrine tumors: Secondary | ICD-10-CM

## 2020-08-11 DIAGNOSIS — C7B8 Other secondary neuroendocrine tumors: Secondary | ICD-10-CM

## 2020-08-11 DIAGNOSIS — I82890 Acute embolism and thrombosis of other specified veins: Secondary | ICD-10-CM

## 2020-08-11 DIAGNOSIS — R162 Hepatomegaly with splenomegaly, not elsewhere classified: Secondary | ICD-10-CM

## 2020-08-11 MED ORDER — GADOBUTROL 1 MMOL/ML IV SOLN
7.0000 mL | Freq: Once | INTRAVENOUS | Status: AC
Start: 2020-08-11 — End: 2020-08-11
  Administered 2020-08-11 (×2): 7 mL via INTRAVENOUS

## 2020-08-20 ENCOUNTER — Ambulatory Visit
Admission: RE | Admit: 2020-08-20 | Discharge: 2020-08-23 | Disposition: A | Discharge status: Home / Self Care | Payer: TRICARE Prime—HMO | Attending: Hematology & Oncology | Admitting: Hematology & Oncology

## 2020-08-20 VITALS — BP 126/60 | HR 65 | Temp 97.8°F | Resp 20

## 2020-08-20 DIAGNOSIS — C787 Secondary malignant neoplasm of liver and intrahepatic bile duct: Secondary | ICD-10-CM | POA: Insufficient documentation

## 2020-08-20 DIAGNOSIS — E340 Carcinoid syndrome: Secondary | ICD-10-CM | POA: Insufficient documentation

## 2020-08-20 DIAGNOSIS — D3A8 Other benign neuroendocrine tumors: Secondary | ICD-10-CM | POA: Insufficient documentation

## 2020-08-20 MED ORDER — OCTREOTIDE ACETATE 30 MG IM KIT
30.0000 mg | PACK | Freq: Once | INTRAMUSCULAR | Status: AC
Start: 2020-08-20 — End: 2020-08-20
  Administered 2020-08-20 (×2): 30 mg via INTRAMUSCULAR
  Filled 2020-08-20: qty 1

## 2020-08-20 NOTE — Interdisciplinary (Signed)
Nurses Note: Patient in for Octreotide injection.  Vital signs stable and in no apparent distress.  Pt reports no nausea, vomiting, constipation, or diarrhea.  Pt given ongoing medication teaching.    Octreotide 30 given RIGHT intragluteal IM.  Patient tolerated injection.  Patient discharged to home in no apparent distress.  Return to clinic in 28 day(s) for Octreotide. Future appointment requests sent.

## 2020-09-17 ENCOUNTER — Encounter: Payer: Self-pay | Admitting: Hospital

## 2020-09-17 ENCOUNTER — Ambulatory Visit
Admission: RE | Admit: 2020-09-17 | Discharge: 2020-09-17 | Disposition: A | Discharge status: Home / Self Care | Payer: TRICARE Prime—HMO | Attending: Hematology & Oncology | Admitting: Hematology & Oncology

## 2020-09-17 VITALS — BP 143/82 | HR 84 | Temp 98.5°F | Resp 17

## 2020-09-17 DIAGNOSIS — E340 Carcinoid syndrome: Secondary | ICD-10-CM | POA: Insufficient documentation

## 2020-09-17 DIAGNOSIS — D3A8 Other benign neuroendocrine tumors: Secondary | ICD-10-CM | POA: Insufficient documentation

## 2020-09-17 DIAGNOSIS — C787 Secondary malignant neoplasm of liver and intrahepatic bile duct: Secondary | ICD-10-CM | POA: Insufficient documentation

## 2020-09-17 MED ORDER — OCTREOTIDE ACETATE 30 MG IM KIT
30.0000 mg | PACK | Freq: Once | INTRAMUSCULAR | Status: AC
Start: 2020-09-17 — End: 2020-09-17
  Administered 2020-09-17 (×2): 30 mg via INTRAMUSCULAR
  Filled 2020-09-17: qty 1

## 2020-09-17 NOTE — Interdisciplinary (Signed)
Patient in for Octreotide (Sandostatin).    Vital signs stable and Pt in no apparent distress.    Pt reports no nausea, vomiting, constipation, or diarrhea.    Pt given ongoing medication teaching and advised to seek medical attention if side effect occur.   Pt aware of precautions.     Octreotide given IM in the LEFT gluteus and bandage placed.  Patient tolerated injection.     NO LABS drawn today.    Medications   octreotide (SANDOSTATIN LAR) depot injection 30 mg (has no administration in time range)     Juliane Lack, LVN  09/17/20

## 2020-10-04 ENCOUNTER — Telehealth (HOSPITAL_BASED_OUTPATIENT_CLINIC_OR_DEPARTMENT_OTHER): Payer: Self-pay

## 2020-10-04 NOTE — Telephone Encounter (Signed)
Placed call to patient to remind to complete CT scans. Per patient, she will call Monday to schedule. She still would like to keep follow up with Dr. Ane Payment on 4/28 as she has questions about her prior MRI completed on 08/11/20.

## 2020-10-07 ENCOUNTER — Telehealth (HOSPITAL_BASED_OUTPATIENT_CLINIC_OR_DEPARTMENT_OTHER): Payer: Self-pay | Admitting: Hematology & Oncology

## 2020-10-07 NOTE — Telephone Encounter (Signed)
[place] Call [place] Message for MD/RN:    Incoming call received from: Cecille Rubin w/ Radiology     Provider: Dr. Ane Payment     Reason for call: Per [place],pt is scheduled for a CT Scan, but pt needs labs prior. Per Cecille Rubin, please send orders to lab. Thank you.     Return call requested: no    Best callback number:     Ok to leave a detailed message:     Inform Caller:    The timeframe for return calls is typically within 24 hours or before the end of the business day.   f

## 2020-10-10 NOTE — Telephone Encounter (Addendum)
CBC and CMP placed. Informed patient that these will be needed prior to her CT scans on 10/30/20. She will complete when she is here for her injection on 10/15/20.

## 2020-10-15 ENCOUNTER — Telehealth (HOSPITAL_BASED_OUTPATIENT_CLINIC_OR_DEPARTMENT_OTHER): Payer: Self-pay | Admitting: Hematology & Oncology

## 2020-10-15 ENCOUNTER — Ambulatory Visit
Admission: RE | Admit: 2020-10-15 | Discharge: 2020-10-17 | Disposition: A | Discharge status: Home / Self Care | Payer: TRICARE Prime—HMO | Attending: Hematology & Oncology | Admitting: Hematology & Oncology

## 2020-10-15 ENCOUNTER — Other Ambulatory Visit (HOSPITAL_BASED_OUTPATIENT_CLINIC_OR_DEPARTMENT_OTHER): Payer: TRICARE Prime—HMO

## 2020-10-15 VITALS — BP 121/65 | HR 78 | Temp 98.0°F | Resp 18 | Ht 65.16 in | Wt 150.8 lb

## 2020-10-15 DIAGNOSIS — C787 Secondary malignant neoplasm of liver and intrahepatic bile duct: Secondary | ICD-10-CM

## 2020-10-15 DIAGNOSIS — E340 Carcinoid syndrome: Secondary | ICD-10-CM | POA: Insufficient documentation

## 2020-10-15 DIAGNOSIS — D3A8 Other benign neuroendocrine tumors: Secondary | ICD-10-CM | POA: Insufficient documentation

## 2020-10-15 MED ORDER — OCTREOTIDE ACETATE 30 MG IM KIT
30.0000 mg | PACK | Freq: Once | INTRAMUSCULAR | Status: AC
Start: 2020-10-15 — End: 2020-10-15
  Administered 2020-10-15 (×2): 30 mg via INTRAMUSCULAR
  Filled 2020-10-15: qty 1

## 2020-10-15 NOTE — Telephone Encounter (Signed)
Ok for octreotide

## 2020-10-15 NOTE — Interdisciplinary (Signed)
Patient arrived ambulatory in stable condition. Vital signs stable.    SANDOSTATIN injection given IM/SQ to RIGHT GLUTEUS using aseptic technique. Patient tolerated well with no adverse reactions. Site benign and band-aid placed.     Patient discharged ambulatory HOME in stable condition.     Medications   octreotide (SANDOSTATIN LAR) depot injection 30 mg (30 mg IntraMUSCULAR Given 10/15/20 1108)       [place], LVN  10/15/20

## 2020-10-16 NOTE — Patient Instructions (Addendum)
INSTRUCTIONS:    Labs today which can be used for the CT scan  Follow up with Dr Ane Payment after the scan to review results    10/30/2020  3:45 PM CT ABDOMEN AND PELVIS WITH CONTRAST [17020] KOP CT KOP CT 1            11/12/2020 10:30 AM INJECTION [facility] FAST TRACK           12/10/2020 10:30 AM INJECTION [16109] [facility] FAST TRACK           01/07/2021 10:30 AM INJECTION [60454] [facility] FAST TRACK       _____________________________________________________________________    Your Team:      PHYSICIAN: Margreta Journey, MD PhD      NURSE PRACTITIONER: Hester Mates, NP  Phone: [phone]  Available Monday-Thursday       NURSE CASE MANAGER: Baldo Daub RN    Phone: [phone]      I may be unable to answer phone calls/voicemails immediately during clinic days, so if you have an urgent question please call our call center [phone]) and they can page me.  You can also choose to speak to the triage nurse for urgent medical questions by selecting that option when you call me.     ADMINISTRATIVE ASSISTANT: Graylon Good (Paperwork, obtaining imaging, scheduling)  Phone: [phone]  Fax: [phone]    *IF you are receiving infusions, please check with the infusion center for a code to enable you to receive free parking on infusion days. Please call [phone] Option 2, or stop at the front desk on your way out. These codes change monthly*                                                                                                                                                 If you have a medical emergency or life threatening situaton, please call 911 or go to the nearest Emergency Room.    AFTER-HOURS CARE:    If you develop a fever of 100.53F or greater after hours (On the weekends and/or Monday through Friday before 8:00am or after 4:30pm) please call [phone] and ask to speak with the  On-Call Oncologist. Please also call the On-Call Oncologist for any urgent symptoms that happen after normal business hours.    AFTER HOURS EMERGENCY NUMBER [phone]                       Ask for the Oncology Physician On-Call     The following link is a video that will help explain choosing a healthcare proxy and the process of advanced care planning.   [email or web address].com/program-preview-who-speaks-[email or web address]-you/    [facility] Phone List for Patients     Clinic  Hours of Operation: Monday-Friday 8:00- 4:30 p.m. (closed holidays and weekends)   Infusion Center Hours: Monday-Friday 7:00-9:30 p.m.; Sat-Sun 8:00-4:30pm.     Social Worker: Neysa Bonito, LCSW [phone]  For emotional, social, spiritual and practical needs that may arise throughout cancer treatment and recovery.    Information Desk/Call Center: [phone]   ** General information: directions, telephone numbers, or available services     [place]: [phone], option 2  ** Call to schedule, cancel, or reschedule chemotherapy appointments   [place] Scheduling: [phone]  [place] Scheduling: [phone]    Radiation Oncology: [phone]   ** Call to schedule, cancel, or reschedule radiation appointments     Financial Counselor: Esperanza Richters [phone]   [email or web address]   [phone] is the centralized billing and insurance information number with a team of knowledgeable representatives ready to help M-F 9am to 6pm.     Registration: [phone] (to update personal information)    [place]:   [phone]  Open M-F 9-6pm    Radiology Scheduling: [phone]   For PET scans: [phone]    Medical Records: [phone]  Fax: [phone]    Chemotherapy Online Education:  Link to recorded Chemotherapy & Immunotherapy Education Class:  [email or web address].hunt.info/     Patient and [place]:  The Patient and [facility]) offers: The most up-to-date cancer information for patients, families, and the community. Informative brochures, videos, and books to help you learn about your cancer diagnosis, treatment options, managing symptoms and side effects, clinical trials, nutrition, coping, caregiving, and self-care. Computers with Internet access to the most comprehensive and credible websites for cancer information and community resources. Various comfort items such as lap blankets, pillows, hats, and wigs.  Open daily; hours vary   Location: [facility], Room 1066, on the first floor just to the left of the lobby elevators  Phone [phone]     *If you get a bill from a molecular testing company ([place], [place], [place], [place], [place]) please let us know before you pay. Our staff can help resolve this.

## 2020-10-17 ENCOUNTER — Ambulatory Visit (HOSPITAL_BASED_OUTPATIENT_CLINIC_OR_DEPARTMENT_OTHER): Payer: TRICARE Prime—HMO | Admitting: Hematology & Oncology

## 2020-10-17 ENCOUNTER — Other Ambulatory Visit (HOSPITAL_BASED_OUTPATIENT_CLINIC_OR_DEPARTMENT_OTHER): Payer: TRICARE Prime—HMO

## 2020-10-17 ENCOUNTER — Ambulatory Visit: Payer: TRICARE Prime—HMO | Attending: Nurse Practitioner | Admitting: Hematology & Oncology

## 2020-10-17 VITALS — BP 148/78 | HR 91 | Temp 98.4°F | Resp 16 | Ht 65.0 in | Wt 151.1 lb

## 2020-10-17 DIAGNOSIS — C787 Secondary malignant neoplasm of liver and intrahepatic bile duct: Secondary | ICD-10-CM | POA: Insufficient documentation

## 2020-10-17 DIAGNOSIS — D3A8 Other benign neuroendocrine tumors: Secondary | ICD-10-CM | POA: Insufficient documentation

## 2020-10-17 DIAGNOSIS — Z79899 Other long term (current) drug therapy: Secondary | ICD-10-CM | POA: Insufficient documentation

## 2020-10-17 LAB — COMPREHENSIVE METABOLIC PANEL, BLOOD
ALT (SGPT): 33 U/L (ref 0–33)
AST (SGOT): 26 U/L (ref 0–32)
Albumin: 4.9 g/dL (ref 3.5–5.2)
Alkaline Phos: 193 U/L — ABNORMAL HIGH (ref 35–104)
Anion Gap: 14 mmol/L (ref 7–15)
BUN: 12 mg/dL (ref 8–23)
Bicarbonate: 26 mmol/L (ref 22–29)
Bilirubin, Tot: 0.48 mg/dL (ref <1.2)
Calcium: 9.7 mg/dL (ref 8.5–10.6)
Chloride: 101 mmol/L (ref 98–107)
Creatinine: 0.57 mg/dL (ref 0.51–0.95)
GFR: >60 mL/min
Glucose: 146 mg/dL — ABNORMAL HIGH (ref 70–99)
Potassium: 4.7 mmol/L (ref 3.5–5.1)
Sodium: 141 mmol/L (ref 136–145)
Total Protein: 7.5 g/dL (ref 6.0–8.0)
eGFR Based on CKD-EPI 2021 Equation: >60 mL/min

## 2020-10-17 LAB — CBC WITH DIFF, BLOOD
ANC-Automated: 6.1 10*3/uL (ref 1.6–7.0)
ANC-Instrument: 6.1 10*3/uL (ref 1.6–7.0)
Abs Basophils: 0 10*3/uL (ref <0.1)
Abs Eosinophils: 0.1 10*3/uL (ref 0.0–0.5)
Abs Lymphs: 1.5 10*3/uL (ref 0.8–3.1)
Abs Monos: 0.7 10*3/uL (ref 0.2–0.8)
Basophils: 0 %
Eosinophils: 1 %
Hct: 41.8 % (ref 34.0–45.0)
Hgb: 14 gm/dL (ref 11.2–15.7)
Lymphocytes: 18 %
MCH: 29.6 pg (ref 26.0–32.0)
MCHC: 33.5 g/dL (ref 32.0–36.0)
MCV: 88.4 um3 (ref 79.0–95.0)
MPV: 9.9 fL (ref 9.4–12.4)
Monocytes: 8 %
Plt Count: 224 10*3/uL (ref 140–370)
RBC: 4.73 10*6/uL (ref 3.90–5.20)
RDW: 12.4 % (ref 12.0–14.0)
Segs: 74 %
WBC: 8.3 10*3/uL (ref 4.0–10.0)

## 2020-10-17 LAB — TSH, BLOOD: TSH: 0.94 u[IU]/mL (ref 0.27–4.20)

## 2020-10-17 LAB — VITAMIN B12, BLOOD: Vitamin B12: 761 pg/mL (ref 232–1245)

## 2020-10-17 NOTE — Progress Notes (Signed)
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. Previous to this noted watery diarrhea and had increase from 28mto 3047mith good resolution. Has had no abdominal pain or carcinoid symptoms. Recent CT CAP and MRI of pancreas October 2020 found new hepatic lesions and slight interval growth compared with 1 year prior.    She is here to discuss additional treatment options for her and overall prognosis.     05/22/2020: presents alone. [facility] says there is progression on her MRI scans but they continued on octreotide 2m60m she was clinically well. Otherwise notes diarrhea if her injection is delayed. [facility] then will need 06/2020 injection from that point forward.     07/18/2020: presents alone. Recent ED visit (1/24) with abdominal pain on right abdomen subcostal, severe when lying down. Restaged at that time showing minor right pleural thickening above right hepatic disease. Scattered 4mm 65mules.No pain has returned, no carcinoid issues.    10/17/2020:  Presents alone. On monthly Sandostatin LAR, tolerating well. Denies flushing, abdominal pain, or diarrhea.    Review of systems: All other systems reviewed and are negative except for what is described in the history of present illness.    Oncologic Timeline:  09/09/2017-04/06/2019: Octreotide Qmonthly  04/27/2019: [facility] [place]  03/2020: Per report, progression on MRI,  continues on octreotide 2mg 70mhly  05/22/2020: F/U Dr. Botta Ane Payment/2022: ED visit with pain, CTA negative  07/18/2020: RV Dr. Botta Ane Payment/222: MRI AP- hepatic lesions similar in size  10/17/2020: RV Dr Botta Ane Payment---------------------------  Past Medical History:   Past Medical History:   Diagnosis Date    Liver metastases (CMS-HCC) 05/05/2019       Medications:   Current Outpatient Medications:     COVID-19 mRNA vaccine (PFIZER) 30 MCG/0.3ML injection, , Disp: , Rfl:     octreotide (SANDOSTATIN LAR) 30 MG injection, 30 mg by IntraMUSCULAR route every 28 days., Disp: , Rfl:     Allergies:   No Known Allergies    Social history:   Alcohol: None  Tobacco: None  Drugs: None  Living Situation: Lives with husband, daughter is a PhD [place]Personal assistantrippSt[place]:  Father: Parkinson/Lewy Body Dementia  Cousins: breast, brain, colon cancer and lymphoma    -----------------------------  Physical exam:  Vitals:BP 148/78 (BP Location: Left arm, BP Patient Position: Sitting, BP cuff size: Regular)    Pulse 91    Temp 98.4 F (36.9 C) (Temporal)    Resp 16    Ht '5\' 5"'  (1.651 m)    Wt 68.5 kg (151 lb 1.6 oz)    SpO2 99%    BMI 25.14 kg/m   Wt Readings from Last 3 Encounters:   10/17/20 68.5 kg (151 lb 1.6 oz)   10/15/20 68.4 kg (150  lb 12.7 oz)   07/23/20 69.5 kg (153 lb 3.5 oz)     PHYSICAL EXAM     ECOG: 0  GENERAL APPEARANCE: The patient is an alert, cooperative female in no acute distress.  SKIN: No lesions or rashes. No jaundice  EYES: PERRLA, EOMs are intact. No icterus.   EAR, NOSE, AND THROAT: The oropharynx is clear and moist.   LYMPH NODES: No appreciable observable lymphadenopathy  CHEST: The lungs are clear to auscultation. Normal respiratory effort  HEART: RRR S1 and S2 are normal.  There are no murmurs or extra sounds.  ABDOMEN:  The abdomen is soft and nontender. +BS. There is no hepatosplenomegaly.   EXTREMITES: There is no edema or cyanosis. Joints are normal without redness or  swelling.  NEURO:  Mental status is normal.  No focal neurologic findings.           -----------------------------  Labs: Reviewed by myself  Lab Results   Component Value Date    WBC 8.3 10/17/2020    RBC 4.73 10/17/2020    HGB 14.0 10/17/2020    HCT 41.8 10/17/2020    MCV 88.4 10/17/2020    MCHC 33.5 10/17/2020    RDW 12.4 10/17/2020    PLT 224 10/17/2020    MPV 9.9 10/17/2020     Lab Results   Component Value Date    BUN 12 10/17/2020    CREAT 0.57 10/17/2020    CL 101 10/17/2020    NA 141 10/17/2020    K 4.7 10/17/2020    CA 9.7 10/17/2020    TBILI 0.48 10/17/2020    ALB 4.9 10/17/2020    TP 7.5 10/17/2020    AST 26 10/17/2020    ALK 193 (H) 10/17/2020    BICARB 26 10/17/2020    ALT 33 10/17/2020    GLU 146 (H) 10/17/2020     Pancreatic Polypeptide 07/2017: 837  Chromogranin A 07/2017: 1  CA19-9 07/2017: 8  Alk Phos 11/2018: 176    Colonoscopy    Radiology: Reviewed by myself in conjunction with radiology report  MRI Abdomen + Pelvis WO/W Contrast    Result Date: 08/13/2020  IMPRESSION: History of pancreatic neuroendocrine tumor, with pancreatic tail mass and numerous hepatic lesions, compatible with primary and metastatic disease. Lesions are better visualized than prior CT of 07/15/2020, though some appear similar in size given differences in technique. Occluded splenic vein near the pancreatic tail mass, with prominent perigastric varices and small gastric mucosal varices. Mild hepatosplenomegaly. Mild terminal ileal wall thickening and enhancement, extending approximately 10 cm, with mural fat deposition, which may represent acute on chronic inflammatory infectious enteritis. Recommend clinical correlation. Bilateral gluteal foci of fat stranding with associated fluid, measuring up to 2.2 x 1.3 cm in the left gluteal subcutaneous region, with peripheral enhancement, which may represent injection sites, although superimposed infection is not excluded. Recommend clinical correlation for pain at this location.      03/2020 [facility] Report      04/04/2019 MRI Pancreas    CT Chest 03/27/2019  - No evidence of thoracic metastatic disease    CT A/P 03/24/2019    PET Gallium 68  - Primary pancreatic mass with intense radiotracer uptake and multiple hepatic metastatic lesions    Pathology:    Ki67 3%    Molecular:    PDL1 0%      -----------------------------  Assessment & Plan:  Leslye Puccini is a 65 year old female presenting with metastatic PNET with progression  on 94m Octreotide Monthly.    We discussed with the patient and their family the natural course of NET. We discussed how many of these come from GI sources including the pancreas, colon, and stomach. Occasionally they are seen from the lung as well. We discussed that they can be indolent and slow growing or quickly capable of rapid growth and spread. Treatment options for symptom relief include somatostatin analogues. Although fewer than 10% of patients see tumor shrinkage with these drugs, prolonged stable disease is seen in 66% which delays progression.     We discussed that if there was interval growth on the next imaging further options include everolimus, CAPTEM, TKIs (Sunitinib, sorafenib, pazopanib, and cabozantinib) and Lutathera. We will also evaluate the ability to send tissue for NGS today with [facility]    Ultimately we would continue octreotide at this time and add everolimus. We discussed evaluating in 2 months to see if growth stopped or continued. After this we would then recommend either CAPTEM or Lutathera. We discussed the side effects dermatologically of everolimus and instructed the patient to pre-empt the rash with a good face hygiene regimen.     Would have an RV in January to discuss restaging scans.    05/22/2020: [facility] now given 3 month interval. She previously had a PET GA68 to follow as well for [place] We discussed increasing octreotide or adding regorafenib/everolimus in addition next. We will  schedule her for her 06/2020 injection as well to reduce her diarrheal burden. RV monthly until stability. NGS completed as above and added to chart. 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 We discussed MEN can be used as a screening tool for future cancers and she will talk to them but doesn't want to make decision now.    07/18/2020: pain may be referred from hepatic disease, no obvious right effusion needing thoracentesis. Pain has resolved and no diarrheal symptoms. Would recommend quick interval scan in 2 months time, mid March. Continue injections of octreotide.      10/17/2020: No symptoms of carcinoid. Tolerating Sandostatin LAR without issues. Labs today (CBC CMP TSH Vit B12). Can use these labs for CT AP scheduled for 5/11. Continue Sandostatin injections and follow up after imaging.         ICD-10-CM ICD-9-CM   1. Primary pancreatic neuroendocrine tumor  D3A.8 209.69   2. Liver metastases (CMS-HCC)  C78.7 197.7   3. Long term current use of therapeutic drug   Z79.899 V58.69     Therapeutic consent is obtained with all patients prior to the first infusion visit or dose. We discuss alternative treatments, expected side effects, and rare occurrences. Further the patient and I discuss the expected outcomes as well as contact number for anyone on our team.    This note represents a split / shared encounter with my supervising attending physician on 10/17/2020.  I personally spent a total of 15 minutes in the care of this patient during this visit.     Patient seen, discussed and plan formulated with attending, Dr BWynonia Lawman MSN, ANP-BC, AOCNP    No LOS data to display      GMargreta Journey MD/PhD  [place] -----------------------------  Lab   Orders Placed This Encounter   Procedures  CBC w/ Diff Lavender    Comprehensive  Metabolic Panel    TSH, Blood - See Instructions    Vitamin B12, Blood Green Plasma Separator Tube     Imaging   No orders of the defined types were placed in this encounter.    Procedures   No orders of the defined types were placed in this encounter.    Other No orders of the defined types were placed in this encounter.

## 2020-10-18 ENCOUNTER — Ambulatory Visit (HOSPITAL_BASED_OUTPATIENT_CLINIC_OR_DEPARTMENT_OTHER): Payer: TRICARE Prime—HMO

## 2020-10-21 ENCOUNTER — Encounter (HOSPITAL_BASED_OUTPATIENT_CLINIC_OR_DEPARTMENT_OTHER): Payer: Self-pay | Admitting: Hematology & Oncology

## 2020-10-21 MED ORDER — COVID-19 MRNA VACCINE (PFIZER) 30 MCG/0.3ML IM SUSP PURPLE CAP
INTRAMUSCULAR | Status: DC
Start: 2020-04-11 — End: 2022-11-26

## 2020-10-30 ENCOUNTER — Ambulatory Visit
Admission: RE | Admit: 2020-10-30 | Discharge: 2020-10-30 | Disposition: A | Discharge status: Home / Self Care | Payer: TRICARE Prime—HMO | Attending: Hematology & Oncology | Admitting: Hematology & Oncology

## 2020-10-30 DIAGNOSIS — D3A8 Other benign neuroendocrine tumors: Secondary | ICD-10-CM | POA: Insufficient documentation

## 2020-10-30 DIAGNOSIS — C787 Secondary malignant neoplasm of liver and intrahepatic bile duct: Secondary | ICD-10-CM | POA: Insufficient documentation

## 2020-10-30 DIAGNOSIS — R918 Other nonspecific abnormal finding of lung field: Secondary | ICD-10-CM | POA: Insufficient documentation

## 2020-10-30 MED ORDER — IOHEXOL 350 MG/ML IV SOLN
125.0000 mL | Freq: Once | INTRAVENOUS | Status: AC
Start: 2020-10-30 — End: 2020-10-30
  Administered 2020-10-30: 150 mL via INTRAVENOUS
  Filled 2020-10-30: qty 125

## 2020-11-07 ENCOUNTER — Ambulatory Visit: Payer: TRICARE Prime—HMO | Attending: Hematology & Oncology | Admitting: Hematology & Oncology

## 2020-11-07 VITALS — BP 140/77 | HR 86 | Temp 98.1°F | Resp 16 | Ht 65.0 in | Wt 151.0 lb

## 2020-11-07 DIAGNOSIS — C787 Secondary malignant neoplasm of liver and intrahepatic bile duct: Secondary | ICD-10-CM | POA: Insufficient documentation

## 2020-11-07 DIAGNOSIS — E340 Carcinoid syndrome: Secondary | ICD-10-CM | POA: Insufficient documentation

## 2020-11-07 DIAGNOSIS — C254 Malignant neoplasm of endocrine pancreas: Secondary | ICD-10-CM | POA: Insufficient documentation

## 2020-11-07 NOTE — Progress Notes (Signed)
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 Veverly Fells at [place] on octreotide since 08/2017. Previous to this noted watery diarrhea and had increase from $RemoveBefor'20mg'UJQbKlzcnrhw$  to $R'30mg'LG$  with good resolution. Has had no abdominal pain or carcinoid symptoms. Recent CT CAP and MRI of pancreas October 2020 found new hepatic lesions and slight interval growth compared with 1 year prior.    She is here to discuss additional treatment options for her and overall prognosis.     05/22/2020: presents alone. States that the [facility] oncology practice is 'closing' and that she wants to transfer care to [place]. Has report from October that says there is progression on her MRI scans but they continued on octreotide $RemoveBefor'30mg'MFjziUiiHSGz$  as she was clinically well. Otherwise notes diarrhea if her injection is delayed. Is set to get last injection at [facility] on 05/28/2020 then will need 06/2020 injection from that point forward.     07/18/2020: presents alone. Recent ED visit (1/24) with abdominal pain on right abdomen subcostal, severe when lying down. Restaged at that time showing minor right pleural thickening above right hepatic disease. Scattered 28mm nodules.No pain has returned, no carcinoid issues.    10/17/2020:  Presents alone. On monthly Sandostatin LAR, tolerating well. Denies flushing, abdominal pain, or diarrhea.    11/07/2020: presents alone today. Restaging scans completed with SD. No diarrhea, flushing, pain.      Review of systems: Less anxiety with scans showing SD, all other systems reviewed and are negative except for what is described in the history of present illness.    Oncologic  Timeline:  09/09/2017-04/06/2019: Octreotide Qmonthly  04/27/2019: [place] [facility] Consult  03/2020: Per report, progression on MRI, continues on octreotide $RemoveBefor'30mg'jAbbMwmxkTdg$  monthly  05/22/2020: F/U Dr. Ane Payment  07/15/2020: ED visit with pain, CTA negative  07/18/2020: RV Dr. Ane Payment  2/20/222: MRI AP- hepatic lesions similar in size  10/17/2020: RV Dr Ane Payment  11/07/2020: RV Dr. Ane Payment    -----------------------------  Past Medical History:   Past Medical History:   Diagnosis Date    Liver metastases (CMS-HCC) 05/05/2019       Medications:   Current Outpatient Medications:     COVID-19 mRNA vaccine (PFIZER) 30 MCG/0.3ML injection, , Disp: , Rfl:     octreotide (SANDOSTATIN LAR) 30 MG injection, 30 mg by IntraMUSCULAR route every 28 days., Disp: , Rfl:     Allergies:   No Known Allergies    Social history:   Alcohol: None  Tobacco: None  Drugs: None  Living Situation: Lives with husband, daughter is a PhD Personal assistant at [employer]    Family history:  Father: Parkinson/Lewy Body Dementia  Cousins: breast, brain, colon cancer and lymphoma    -----------------------------  Physical exam:  Vitals:BP 140/77 (BP Location: Left arm, BP Patient Position: Sitting, BP cuff size: Regular)    Pulse 86    Temp 98.1 F (36.7 C) (Temporal)    Resp 16    Ht $R'5\' 5"'dx$  (1.651 m)    Wt 68.5 kg (151 lb)    SpO2 97%  BMI 25.13 kg/m   Wt Readings from Last 3 Encounters:   11/12/20 68.8 kg (151 lb 10.8 oz)   11/07/20 68.5 kg (151 lb)   10/17/20 68.5 kg (151 lb 1.6 oz)     PHYSICAL EXAM   (unchanged)  ECOG: 0  GENERAL APPEARANCE: The patient is an alert, cooperative female in no acute distress.  SKIN: No lesions or rashes. No jaundice  EYES: PERRLA, EOMs are intact. No icterus.   EAR, NOSE, AND THROAT: The oropharynx is clear and moist.   LYMPH NODES: No appreciable observable lymphadenopathy  CHEST: The lungs are clear to auscultation. Normal respiratory effort  HEART: RRR S1 and S2 are normal.  There are no murmurs or extra sounds.  ABDOMEN:  The  abdomen is soft and nontender. +BS. There is no hepatosplenomegaly.   EXTREMITES: There is no edema or cyanosis. Joints are normal without redness or swelling.  NEURO:  Mental status is normal.  No focal neurologic findings.           -----------------------------  Labs: Reviewed by myself  Lab Results   Component Value Date    WBC 8.3 10/17/2020    RBC 4.73 10/17/2020    HGB 14.0 10/17/2020    HCT 41.8 10/17/2020    MCV 88.4 10/17/2020    MCHC 33.5 10/17/2020    RDW 12.4 10/17/2020    PLT 224 10/17/2020    MPV 9.9 10/17/2020     Lab Results   Component Value Date    BUN 12 10/17/2020    CREAT 0.57 10/17/2020    CL 101 10/17/2020    NA 141 10/17/2020    K 4.7 10/17/2020    CA 9.7 10/17/2020    TBILI 0.48 10/17/2020    ALB 4.9 10/17/2020    TP 7.5 10/17/2020    AST 26 10/17/2020    ALK 193 (H) 10/17/2020    BICARB 26 10/17/2020    ALT 33 10/17/2020    GLU 146 (H) 10/17/2020     Pancreatic Polypeptide 07/2017: 837  Chromogranin A 07/2017: 1  CA19-9 07/2017: 8  Alk Phos 11/2018: 176    Colonoscopy    Radiology: Reviewed by myself in conjunction with radiology report  CT Chest With Contrast    Result Date: 10/30/2020  IMPRESSION: No convincing evidence of intrathoracic metastatic disease. Previous right pleural effusion has resolved.  Please refer to concurrent CT abdomen and pelvis for findings below the diaphragm.    CT Abdomen And Pelvis With Contrast    Result Date: 10/30/2020  IMPRESSION: CT scan of the abdomen and pelvis with IV contrast. History of pancreatic neuroendocrine tumor with similar appearance of liver lesions compared to MRI abdomen and pelvis 08/11/2020. Pancreatic tail mass appears slightly larger compared to prior MRI, although this may be partially due to differences in technique. Please refer to the concurrently obtained chest CT for a complete report on findings above the diaphragm.     03/2020 [facility] Report      04/04/2019 MRI Pancreas    CT Chest 03/27/2019  - No evidence of thoracic metastatic  disease    CT A/P 03/24/2019    PET Gallium 68  - Primary pancreatic mass with intense radiotracer uptake and multiple hepatic metastatic lesions    Pathology:    Ki67 3%    Molecular:    PDL1 0%      -----------------------------  Assessment & Plan:  Jenna Bridges is a 65 year old female presenting with metastatic  PNET with progression on $RemoveBefore'30mg'vszVBXrHAXyMa$  Octreotide Monthly.    We discussed with the patient and their family the natural course of NET. We discussed how many of these come from GI sources including the pancreas, colon, and stomach. Occasionally they are seen from the lung as well. We discussed that they can be indolent and slow growing or quickly capable of rapid growth and spread. Treatment options for symptom relief include somatostatin analogues. Although fewer than 10% of patients see tumor shrinkage with these drugs, prolonged stable disease is seen in 66% which delays progression.     We discussed that if there was interval growth on the next imaging further options include everolimus, CAPTEM, TKIs (Sunitinib, sorafenib, pazopanib, and cabozantinib) and Lutathera. We will also evaluate the ability to send tissue for NGS today with [employer]    Ultimately we would continue octreotide at this time and add everolimus. We discussed evaluating in 2 months to see if growth stopped or continued. After this we would then recommend either CAPTEM or Lutathera. We discussed the side effects dermatologically of everolimus and instructed the patient to pre-empt the rash with a good face hygiene regimen.     Would have an RV in January to discuss restaging scans.    05/22/2020: We will port her scans in from [employer] and restage now given 3 month interval. She previously had a PET GA68 to follow as well for [place]. We discussed increasing octreotide or adding regorafenib/everolimus in addition next. We will schedule her for her 06/2020 injection as well to reduce her diarrheal burden. RV monthly until stability. NGS completed as  above and added to chart. 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 We discussed MEN can be used as a screening tool for future cancers and she will talk to them but doesn't want to make decision now.    07/18/2020: pain may be referred from hepatic disease, no obvious right effusion needing thoracentesis. Pain has resolved and no diarrheal symptoms. Would recommend quick interval scan in 2 months time, mid March. Continue injections of octreotide.    10/17/2020: No symptoms of carcinoid. Tolerating Sandostatin LAR without issues. Labs today (CBC CMP TSH Vit B12). Can use these labs for CT AP scheduled for 5/11. Continue Sandostatin injections and follow up after imaging.     11/07/2020: doing very well, scans show stability. No nutritional deficiencies seen on labs. Continue with therapy regularly each month. Restage in 3-6 months.        ICD-10-CM ICD-9-CM   1. Carcinoid syndrome (CMS-HCC)  E34.0 259.2   2. Malignant neoplasm of endocrine pancreas (CMS-HCC)  C25.4 157.4   3. Liver metastases (CMS-HCC)  C78.7 197.7     Therapeutic consent is obtained with all patients prior to the first infusion visit or dose. We discuss alternative treatments, expected side effects, and rare occurrences. Further the patient and I discuss the expected outcomes as well as contact number for anyone on our team.      I spent a total of 30 minutes on the day of the visit.      Margreta Journey, MD/PhD  [place]  -----------------------------  Lab   No orders of the defined types were placed in this encounter.    Imaging   No orders of the defined types were placed in this encounter.  Procedures   No orders of the defined types were placed in this encounter.    Other No orders of the defined types were placed in this encounter.

## 2020-11-12 ENCOUNTER — Ambulatory Visit
Admission: RE | Admit: 2020-11-12 | Discharge: 2020-11-13 | Disposition: A | Discharge status: Home / Self Care | Payer: TRICARE Prime—HMO | Attending: Hematology & Oncology | Admitting: Hematology & Oncology

## 2020-11-12 VITALS — BP 144/72 | HR 82 | Temp 98.9°F | Resp 17 | Ht 65.16 in | Wt 151.7 lb

## 2020-11-12 DIAGNOSIS — E340 Carcinoid syndrome: Secondary | ICD-10-CM | POA: Insufficient documentation

## 2020-11-12 DIAGNOSIS — C787 Secondary malignant neoplasm of liver and intrahepatic bile duct: Secondary | ICD-10-CM | POA: Insufficient documentation

## 2020-11-12 DIAGNOSIS — C254 Malignant neoplasm of endocrine pancreas: Secondary | ICD-10-CM | POA: Insufficient documentation

## 2020-11-12 MED ORDER — OCTREOTIDE ACETATE 30 MG IM KIT
30.0000 mg | PACK | Freq: Once | INTRAMUSCULAR | Status: AC
Start: 2020-11-12 — End: 2020-11-12
  Administered 2020-11-12 (×2): 30 mg via INTRAMUSCULAR
  Filled 2020-11-12: qty 1

## 2020-11-12 NOTE — Interdisciplinary (Signed)
Patient arrived ambulatory in stable condition. Vital signs stable.    SANDOSTATIN injection given IM to LEFT GLUTEUS using aseptic technique. Patient tolerated well with no adverse reactions. Site benign and band-aid placed.     Patient discharged ambulatory HOME in stable condition.     Medications   octreotide (SANDOSTATIN LAR) depot injection 30 mg (30 mg IntraMUSCULAR Given 11/12/20 1027)       [place], LVN  11/12/20

## 2020-12-10 ENCOUNTER — Ambulatory Visit
Admission: RE | Admit: 2020-12-10 | Discharge: 2020-12-12 | Disposition: A | Discharge status: Home / Self Care | Payer: TRICARE Prime—HMO | Attending: Nurse Practitioner | Admitting: Nurse Practitioner

## 2020-12-10 VITALS — BP 140/75 | HR 83 | Temp 98.4°F | Resp 17 | Ht 65.16 in | Wt 149.9 lb

## 2020-12-10 DIAGNOSIS — C787 Secondary malignant neoplasm of liver and intrahepatic bile duct: Secondary | ICD-10-CM | POA: Insufficient documentation

## 2020-12-10 DIAGNOSIS — C254 Malignant neoplasm of endocrine pancreas: Secondary | ICD-10-CM | POA: Insufficient documentation

## 2020-12-10 DIAGNOSIS — E340 Carcinoid syndrome: Secondary | ICD-10-CM | POA: Insufficient documentation

## 2020-12-10 MED ORDER — OCTREOTIDE ACETATE 30 MG IM KIT
30.0000 mg | PACK | Freq: Once | INTRAMUSCULAR | Status: AC
Start: 2020-12-10 — End: 2020-12-10
  Administered 2020-12-10: 30 mg via INTRAMUSCULAR
  Filled 2020-12-10: qty 1

## 2020-12-10 NOTE — Interdisciplinary (Addendum)
Nurses Note: Patient in for [facility] injection.  Vital signs stable and in no apparent distress.  Pt reports no nausea, vomiting, constipation, or diarrhea.  Pt given ongoing medication teaching.    SANDOSTATIN  given IM to RIGHT glute.  Patient tolerated injection.  Patient discharged to home in no apparent distress.  Return to clinic in 1 month(s) for sandostatin.    Medications   octreotide (SANDOSTATIN LAR) depot injection 30 mg (30 mg IntraMUSCULAR Given 12/10/20 1023)

## 2021-01-07 ENCOUNTER — Ambulatory Visit
Admission: RE | Admit: 2021-01-07 | Discharge: 2021-01-07 | Disposition: A | Discharge status: Home / Self Care | Payer: TRICARE Prime—HMO | Attending: Nurse Practitioner | Admitting: Nurse Practitioner

## 2021-01-07 VITALS — BP 138/78 | HR 83 | Temp 98.4°F | Resp 18 | Ht 65.16 in | Wt 150.1 lb

## 2021-01-07 DIAGNOSIS — E340 Carcinoid syndrome: Secondary | ICD-10-CM | POA: Insufficient documentation

## 2021-01-07 DIAGNOSIS — C787 Secondary malignant neoplasm of liver and intrahepatic bile duct: Secondary | ICD-10-CM

## 2021-01-07 DIAGNOSIS — C254 Malignant neoplasm of endocrine pancreas: Secondary | ICD-10-CM

## 2021-01-07 MED ORDER — OCTREOTIDE ACETATE 30 MG IM KIT
30.0000 mg | PACK | Freq: Once | INTRAMUSCULAR | Status: AC
Start: 2021-01-07 — End: 2021-01-07
  Administered 2021-01-07: 30 mg via INTRAMUSCULAR
  Filled 2021-01-07: qty 1

## 2021-01-07 NOTE — Interdisciplinary (Signed)
Patient arrived ambulatory in stable condition. Vital signs stable.    SANDOSTATIN injection given IM to LEFT GLUTEUS using aseptic technique. Patient tolerated well with no adverse reactions. Site benign and band-aid placed.     Patient discharged ambulatory HOME in stable condition.     Medications   octreotide (SANDOSTATIN LAR) depot injection 30 mg (30 mg IntraMUSCULAR Given 01/07/21 1023)       [place], LVN  01/07/21

## 2021-01-14 ENCOUNTER — Telehealth (HOSPITAL_BASED_OUTPATIENT_CLINIC_OR_DEPARTMENT_OTHER): Payer: Self-pay

## 2021-01-14 ENCOUNTER — Encounter (HOSPITAL_BASED_OUTPATIENT_CLINIC_OR_DEPARTMENT_OTHER): Payer: Self-pay | Admitting: Nurse Practitioner

## 2021-01-14 NOTE — Telephone Encounter (Signed)
Returned call to patient letting her know that her infusion orders have been renewed so she should be set for August appointment. Patient verbalized understanding and thanked RN for the call.

## 2021-01-14 NOTE — Telephone Encounter (Signed)
Patient left message stating that she has scheduled her infusion appointments for her shots but she was told that these were not authorized by the doctors yet. Requesting a call back.

## 2021-02-04 ENCOUNTER — Ambulatory Visit
Admission: RE | Admit: 2021-02-04 | Discharge: 2021-02-04 | Disposition: A | Discharge status: Home / Self Care | Payer: TRICARE Prime—HMO | Attending: Nurse Practitioner | Admitting: Nurse Practitioner

## 2021-02-04 VITALS — BP 140/75 | HR 83 | Temp 98.9°F | Resp 20 | Ht 65.16 in | Wt 151.1 lb

## 2021-02-04 DIAGNOSIS — E340 Carcinoid syndrome: Secondary | ICD-10-CM | POA: Insufficient documentation

## 2021-02-04 DIAGNOSIS — C254 Malignant neoplasm of endocrine pancreas: Secondary | ICD-10-CM | POA: Insufficient documentation

## 2021-02-04 DIAGNOSIS — C787 Secondary malignant neoplasm of liver and intrahepatic bile duct: Secondary | ICD-10-CM | POA: Insufficient documentation

## 2021-02-04 MED ORDER — OCTREOTIDE ACETATE 30 MG IM KIT
30.0000 mg | PACK | Freq: Once | INTRAMUSCULAR | Status: AC
Start: 2021-02-04 — End: 2021-02-04
  Administered 2021-02-04 (×2): 30 mg via INTRAMUSCULAR
  Filled 2021-02-04: qty 1

## 2021-02-04 NOTE — Interdisciplinary (Signed)
Patient in for Octreotide.  Vital signs stable and in no apparent distress.  Pt reports no nausea, vomiting, constipation, or diarrhea.  Pt given ongoing medication teaching.    Octreotide given IM in the RIGHT gluteus.   Patient tolerated injection.    Patient discharged to home in no apparent distress. Return to clinic in 4 week(s) for Octreotide.

## 2021-02-28 ENCOUNTER — Telehealth (HOSPITAL_BASED_OUTPATIENT_CLINIC_OR_DEPARTMENT_OTHER): Payer: Self-pay

## 2021-02-28 NOTE — Telephone Encounter (Signed)
Called patient and informed her of need to schedule scans. Advised she schedule scans then call Ana back for a follow up appointment. She verbalized understanding.

## 2021-03-03 ENCOUNTER — Telehealth (HOSPITAL_BASED_OUTPATIENT_CLINIC_OR_DEPARTMENT_OTHER): Payer: Self-pay | Admitting: Hematology & Oncology

## 2021-03-03 ENCOUNTER — Encounter (HOSPITAL_BASED_OUTPATIENT_CLINIC_OR_DEPARTMENT_OTHER): Payer: Self-pay | Admitting: Nurse Practitioner

## 2021-03-03 DIAGNOSIS — Z79899 Other long term (current) drug therapy: Secondary | ICD-10-CM

## 2021-03-03 DIAGNOSIS — D3A8 Other benign neuroendocrine tumors: Secondary | ICD-10-CM

## 2021-03-03 NOTE — Telephone Encounter (Signed)
Call back to patient to inform that lab orders have been placed. Confirmed that these can be done same day as her sandostatin injection scheduled tomorrow, 9/06/24/20. She v/u and will complete at that time. CT scheduled 03/19/21, MD follow up 03/26/21.

## 2021-03-03 NOTE — Telephone Encounter (Signed)
Patient called stating that she needs labs prior to imaging but was told there are no orders. Please if orders can be placed so she can get her labs done early next week.

## 2021-03-04 ENCOUNTER — Ambulatory Visit
Admission: RE | Admit: 2021-03-04 | Discharge: 2021-03-04 | Disposition: A | Discharge status: Home / Self Care | Payer: TRICARE Prime—HMO | Attending: Nurse Practitioner | Admitting: Nurse Practitioner

## 2021-03-04 VITALS — BP 125/70 | HR 82 | Temp 98.8°F | Resp 18 | Ht 65.16 in | Wt 149.9 lb

## 2021-03-04 DIAGNOSIS — E340 Carcinoid syndrome: Secondary | ICD-10-CM | POA: Insufficient documentation

## 2021-03-04 DIAGNOSIS — D3A8 Other benign neuroendocrine tumors: Secondary | ICD-10-CM | POA: Insufficient documentation

## 2021-03-04 DIAGNOSIS — C254 Malignant neoplasm of endocrine pancreas: Secondary | ICD-10-CM | POA: Insufficient documentation

## 2021-03-04 DIAGNOSIS — Z79899 Other long term (current) drug therapy: Secondary | ICD-10-CM | POA: Insufficient documentation

## 2021-03-04 DIAGNOSIS — C787 Secondary malignant neoplasm of liver and intrahepatic bile duct: Secondary | ICD-10-CM | POA: Insufficient documentation

## 2021-03-04 LAB — CBC WITH DIFF, BLOOD
ANC-Automated: 4.5 10*3/uL (ref 1.6–7.0)
ANC-Instrument: 4.5 10*3/uL (ref 1.6–7.0)
Abs Basophils: 0 10*3/uL (ref <0.2)
Abs Eosinophils: 0.1 10*3/uL (ref 0.0–0.5)
Abs Lymphs: 1.8 10*3/uL (ref 0.8–3.1)
Abs Monos: 0.5 10*3/uL (ref 0.2–0.8)
Basophils: 0 %
Eosinophils: 1 %
Hct: 40.7 % (ref 34.0–45.0)
Hgb: 13.9 gm/dL (ref 11.2–15.7)
Lymphocytes: 26 %
MCH: 29.6 pg (ref 26.0–32.0)
MCHC: 34.2 g/dL (ref 32.0–36.0)
MCV: 86.6 um3 (ref 79.0–95.0)
MPV: 10.5 fL (ref 9.4–12.4)
Monocytes: 8 %
Plt Count: 202 10*3/uL (ref 140–370)
RBC: 4.7 10*6/uL (ref 3.90–5.20)
RDW: 12.8 % (ref 12.0–14.0)
Segs: 65 %
WBC: 7 10*3/uL (ref 4.0–10.0)

## 2021-03-04 LAB — COMPREHENSIVE METABOLIC PANEL, BLOOD
ALT (SGPT): 31 U/L (ref 0–33)
AST (SGOT): 31 U/L (ref 0–32)
Albumin: 4.6 g/dL (ref 3.5–5.2)
Alkaline Phos: 147 U/L — ABNORMAL HIGH (ref 40–130)
Anion Gap: 14 mmol/L (ref 7–15)
BUN: 12 mg/dL (ref 8–23)
Bicarbonate: 25 mmol/L (ref 22–29)
Bilirubin, Tot: 0.42 mg/dL (ref <1.2)
Calcium: 9.6 mg/dL (ref 8.5–10.6)
Chloride: 102 mmol/L (ref 98–107)
Creatinine: 0.59 mg/dL (ref 0.51–0.95)
GFR: >60 mL/min
Glucose: 93 mg/dL (ref 70–99)
Potassium: 4.1 mmol/L (ref 3.5–5.1)
Sodium: 141 mmol/L (ref 136–145)
Total Protein: 7.6 g/dL (ref 6.0–8.0)
eGFR Based on CKD-EPI 2021 Equation: >60 mL/min

## 2021-03-04 LAB — TSH, BLOOD: TSH: 1.1 u[IU]/mL (ref 0.27–4.20)

## 2021-03-04 MED ORDER — OCTREOTIDE ACETATE 30 MG IM KIT
30.0000 mg | PACK | Freq: Once | INTRAMUSCULAR | Status: AC
Start: 2021-03-04 — End: 2021-03-04
  Administered 2021-03-04 (×2): 30 mg via INTRAMUSCULAR
  Filled 2021-03-04: qty 1

## 2021-03-04 NOTE — Interdisciplinary (Signed)
Nurses Note: Patient in for Octreotide injection.  Vital signs stable and in no apparent distress.  Pt reports no nausea, vomiting, constipation, or diarrhea.  Pt given ongoing medication teaching.    Octreotide given IM in LEFT gluteus.  Patient tolerated injection.  Patient discharged to home in no apparent distress.  Return to clinic in 4 week(s) for Octreotide    Medications   octreotide (SANDOSTATIN LAR) depot injection 30 mg (30 mg IntraMUSCULAR Given 03/04/21 1048)     .

## 2021-03-19 ENCOUNTER — Ambulatory Visit
Admission: RE | Admit: 2021-03-19 | Discharge: 2021-03-19 | Disposition: A | Discharge status: Home / Self Care | Payer: TRICARE Prime—HMO | Attending: Hematology & Oncology | Admitting: Hematology & Oncology

## 2021-03-19 DIAGNOSIS — C787 Secondary malignant neoplasm of liver and intrahepatic bile duct: Secondary | ICD-10-CM | POA: Insufficient documentation

## 2021-03-19 DIAGNOSIS — C259 Malignant neoplasm of pancreas, unspecified: Secondary | ICD-10-CM

## 2021-03-19 DIAGNOSIS — D3A8 Other benign neuroendocrine tumors: Secondary | ICD-10-CM | POA: Insufficient documentation

## 2021-03-19 MED ORDER — IOHEXOL 350 MG/ML IV SOLN
100.0000 mL | Freq: Once | INTRAVENOUS | Status: AC
Start: 2021-03-19 — End: 2021-03-19
  Administered 2021-03-19 (×2): 100 mL via INTRAVENOUS
  Filled 2021-03-19: qty 100

## 2021-03-26 ENCOUNTER — Ambulatory Visit: Payer: TRICARE Prime—HMO | Attending: Hematology & Oncology | Admitting: Hematology & Oncology

## 2021-03-26 VITALS — BP 152/79 | HR 98 | Temp 97.5°F | Resp 18 | Ht 66.0 in | Wt 150.0 lb

## 2021-03-26 DIAGNOSIS — D3A8 Other benign neuroendocrine tumors: Secondary | ICD-10-CM | POA: Insufficient documentation

## 2021-03-26 DIAGNOSIS — C787 Secondary malignant neoplasm of liver and intrahepatic bile duct: Secondary | ICD-10-CM | POA: Insufficient documentation

## 2021-03-26 DIAGNOSIS — E340 Carcinoid syndrome: Secondary | ICD-10-CM | POA: Insufficient documentation

## 2021-03-26 NOTE — Patient Instructions (Addendum)
INSTRUCTIONS:    Return to clinic in 2 months    _____________________________________________________________________    Your Team:      PHYSICIAN: Margreta Journey, MD PhD      NURSE PRACTITIONER: Hester Mates, NP  Phone: [phone]  Available Monday-Thursday       NURSE CASE MANAGER: Baldo Daub RN    Phone: [phone]      I may be unable to answer phone calls/voicemails immediately during clinic days, so if you have an urgent question please call our call center [phone]) and they can page me.  You can also choose to speak to the triage nurse for urgent medical questions by selecting that option when you call me.     ADMINISTRATIVE ASSISTANT: Graylon Good (Paperwork, obtaining imaging, scheduling)  Phone: [phone]  Fax: [phone]    *IF you are receiving infusions, please check with the infusion center for a code to enable you to receive free parking on infusion days. Please call [phone] Option 2, or stop at the front desk on your way out. These codes change monthly*                                                                                                                                                 If you have a medical emergency or life threatening situaton, please call 911 or go to the nearest Emergency Room.    AFTER-HOURS CARE:    If you develop a fever of 100.55F or greater after hours (On the weekends and/or Monday through Friday before 8:00am or after 4:30pm) please call [phone] and ask to speak with the On-Call Oncologist. Please also call the On-Call Oncologist for any urgent symptoms that happen after normal business hours.    AFTER HOURS EMERGENCY NUMBER [phone]                       Ask for the Oncology Physician On-Call     The following link is a video that will help explain choosing a healthcare proxy and the process of advanced care planning.   [email or web address].com/program-preview-who-speaks-for-you/    [facility] Phone  List for Patients     Clinic Hours of Operation: Monday-Friday 8:00- 4:30 p.m. (closed holidays and weekends)   Infusion Center Hours: Monday-Friday 7:00-9:30 p.m.; Sat-Sun 8:00-4:30pm.     Social Worker: Neysa Bonito, LCSW [phone]  For emotional, social, spiritual and practical needs that may arise throughout cancer treatment and recovery.    Information Desk/Call Center: [phone]   ** General information: directions, telephone numbers, or available services     [place]: [phone], option 2  ** Call to schedule, cancel, or reschedule chemotherapy appointments   [place] Scheduling: [phone]  [place] Scheduling: [phone]    Radiation  Oncology: [phone]   ** Call to schedule, cancel, or reschedule radiation appointments     Financial Counselor: Marisela Villanueva [phone]   [email or web address]   [phone] is the centralized billing and insurance information number with a team of knowledgeable representatives ready to help M-F 9am to 6pm.     Registration: [phone] (to update personal information)    [facility]:   Ph:[phone]  Fax:[phone]  Open M-F 9-6pm    Radiology Scheduling: [phone]   For PET scans: [phone]    Medical Records: [phone]  Fax: [phone]    Chemotherapy Online Education:  Link to recorded Chemotherapy & Immunotherapy Education Class: https://www.youtube.com/watch?v=q3hO1-M8QTE&feature=emb_title     Patient and Family Resource Center:  The Patient and Family Resource Center (PFRC) offers: The most up-to-date cancer information for patients, families, and the community. Informative brochures, videos, and books to help you learn about your cancer diagnosis, treatment options, managing symptoms and side effects, clinical trials, nutrition, coping, caregiving, and self-care. Computers with Internet access to the most  comprehensive and credible websites for cancer information and community resources. Various comfort items such as lap blankets, pillows, hats, and wigs.  Open daily; hours vary   Location: [facility], Room 1066, on the first floor just to the left of the lobby elevators  Phone [phone]     *If you get a bill from a molecular testing company ([employer], [employer], [employer], [employer], [employer], [employer]) please let us know before you pay. Our staff can help resolve this.

## 2021-03-26 NOTE — Progress Notes (Signed)
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 NVeverly Fellsat B[facility]enon octreotide since 08/2017. Previous to this noted watery diarrhea and had increase from 282mto 3028mith good resolution. Has had no abdominal pain or carcinoid symptoms. Recent CT CAP and MRI of pancreas October 2020 found new hepatic lesions and slight interval growth compared with 1 year prior.    She is here to discuss additional treatment options for her and overall prognosis.     05/22/2020: [facility] alone. [facility] and [facility] says there is progression on her MRI scans but they continued on octreotide 23m79m she was clinically well. Otherwise notes diarrhea if her injection is delayed. [facility] then will need 06/2020 injection from that point forward.     07/18/2020: presents alone. Recent ED visit (1/24) with abdominal pain on right abdomen subcostal, severe when lying down. Restaged at that time showing minor right pleural thickening above right hepatic disease. Scattered 4mm 78mules.No pain has returned, no carcinoid issues.    10/17/2020:  Presents alone. On monthly Sandostatin LAR, tolerating well. Denies flushing, abdominal pain, or diarrhea.    11/07/2020: presents alone today. Restaging scans completed with SD. No diarrhea, flushing, pain.    03/26/2021: presents alone today. Stress about scans but stable since 10/2020. OK with three times a year scans. No issues with carcinoid syndrome. Has RUQ pain stable, believes from  stress. Discussion on injection and depot with previous scar tissue.    Review of systems: Less anxiety with scans showing SD again today, all other systems reviewed and are negative except for what is described in the history of present illness.    Oncologic Timeline:  09/09/2017-04/06/2019: Octreotide Qmonthly  04/27/2019: [facility]  03/2020: Per report, progression on MRI, continues on octreotide 23mg 45mhly  05/22/2020: F/U Dr. Jashay Roddy Ane Payment/2022: ED visit with pain, CTA negative  07/18/2020: RV Dr. Ellamay Fors Ane Payment/222: MRI AP- hepatic lesions similar in size  10/17/2020: RV Dr Kiah Keay Ane Payment/2022: RV Dr. Chaun Uemura Ane Payment/2022: RV  05/26/2021: RV  07/27/2020: RV with new staging    -----------------------------  Past Medical History:   Past Medical History:   Diagnosis Date    Liver metastases (CMS-HCC) 05/05/2019       Medications:   Current Outpatient Medications:     COVID-19 mRNA vaccine (PFIZER) 30 MCG/0.3ML injection, , Disp: , Rfl:     octreotide (SANDOSTATIN LAR) 30 MG injection, 30 mg by IntraMUSCULAR route every 28 days., Disp: , Rfl:     Allergies:   No Known Allergies    Social history:   Alcohol: None  Tobacco: None  Drugs: None  Living Situation: Lives with husband, daughter is a PhD scientPersonal assistantrippDouglasry:  Father: Parkinson/Lewy Body Dementia  Cousins: breast, brain, colon cancer and lymphoma    -----------------------------  Physical exam:  Vitals:BP 152/79 (  BP Location: Left arm, BP Patient Position: Sitting, BP cuff size: Regular)    Pulse 98    Temp 97.5 F (36.4 C) (Temporal)    Resp 18    Ht '5\' 6"'  (1.676 m)    Wt 68 kg (150 lb)    SpO2 97%    BMI 24.21 kg/m   Wt Readings from Last 3 Encounters:   03/26/21 68 kg (150 lb)   03/04/21 68 kg (149 lb 14.6 oz)   02/04/21 68.6 kg (151 lb 2.4 oz)     PHYSICAL EXAM   (unchanged)  ECOG: 0  GENERAL APPEARANCE: The patient is an alert, cooperative female in no acute distress.  SKIN: No lesions or rashes. No  jaundice  EYES: PERRLA, EOMs are intact. No icterus.   EAR, NOSE, AND THROAT: The oropharynx is clear and moist.   LYMPH NODES: No appreciable observable lymphadenopathy  CHEST: The lungs are clear to auscultation. Normal respiratory effort  HEART: RRR S1 and S2 are normal.  There are no murmurs or extra sounds.  ABDOMEN:  The abdomen is soft and nontender. +BS. There is no hepatosplenomegaly.   EXTREMITES: There is no edema or cyanosis. Joints are normal without redness or swelling.  NEURO:  Mental status is normal.  No focal neurologic findings.           -----------------------------  Labs: Reviewed by myself  Lab Results   Component Value Date    WBC 7.0 03/04/2021    RBC 4.70 03/04/2021    HGB 13.9 03/04/2021    HCT 40.7 03/04/2021    MCV 86.6 03/04/2021    MCHC 34.2 03/04/2021    RDW 12.8 03/04/2021    PLT 202 03/04/2021    MPV 10.5 03/04/2021     Lab Results   Component Value Date    BUN 12 03/04/2021    CREAT 0.59 03/04/2021    CL 102 03/04/2021    NA 141 03/04/2021    K 4.1 03/04/2021    R[place]on 9.6 03/04/2021    TBILI 0.42 03/04/2021    ALB 4.6 03/04/2021    TP 7.6 03/04/2021    AST 31 03/04/2021    ALK 147 (H) 03/04/2021    BICARB 25 03/04/2021    ALT 31 03/04/2021    GLU 93 03/04/2021     Pancreatic Polypeptide 07/2017: 837  Chromogranin A 07/2017: 1  CA19-9 07/2017: 8  Alk Phos 11/2018: 176    Colonoscopy    Radiology: Reviewed by myself in conjunction with radiology report  CT Chest With Contrast    Result Date: 03/19/2021  IMPRESSION: No evidence of intrathoracic metastatic disease.     CT Abdomen And Pelvis With Contrast    Result Date: 03/20/2021  IMPRESSION: CT scan of the abdomen and pelvis with IV contrast Stable size of the primary pancreatic tail mass and index hepatic metastases     03/2020 [facility] Report      04/04/2019 MRI Pancreas    CT Chest 03/27/2019  - No evidence of thoracic metastatic disease    CT A/P 03/24/2019    PET Gallium 68  - Primary pancreatic mass with intense radiotracer uptake and  multiple hepatic metastatic lesions    Pathology:    Ki67 3%    Molecular:    PDL1 0%      -----------------------------  Assessment & Plan:  Juana Haralson is a 65 year old female presenting with metastatic PNET with progression on 85m Octreotide Monthly.  We discussed with the patient and their family the natural course of NET. We discussed how many of these come from GI sources including the pancreas, colon, and stomach. Occasionally they are seen from the lung as well. We discussed that they can be indolent and slow growing or quickly capable of rapid growth and spread. Treatment options for symptom relief include somatostatin analogues. Although fewer than 10% of patients see tumor shrinkage with these drugs, prolonged stable disease is seen in 66% which delays progression.     We discussed that if there was interval growth on the next imaging further options include everolimus, CAPTEM, TKIs (Sunitinib, sorafenib, pazopanib, and cabozantinib) and Lutathera. We will also evaluate the ability to send tissue for NGS today with [facility]    Ultimately we would continue octreotide at this time and add everolimus. We discussed evaluating in 2 months to see if growth stopped or continued. After this we would then recommend either CAPTEM or Lutathera. We discussed the side effects dermatologically of everolimus and instructed the patient to pre-empt the rash with a good face hygiene regimen.     Would have an RV in January to discuss restaging scans.    05/22/2020: We will port her scans in from [employer] and restage now given 3 month interval. She previously had a PET GA68 to follow as well for [place]. We discussed increasing octreotide or adding regorafenib/everolimus in addition next. We will schedule her for her 06/2020 injection as well to reduce her diarrheal burden. RV monthly until stability. NGS completed as above and added to chart. 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 We discussed MEN can be used as a screening tool for future cancers and she will talk to them but doesn't want to make decision now.    07/18/2020: pain may be referred from hepatic disease, no obvious right effusion needing thoracentesis. Pain has resolved and no diarrheal symptoms. Would recommend quick interval scan in 2 months time, mid March. Continue injections of octreotide.    10/17/2020: No symptoms of carcinoid. Tolerating Sandostatin LAR without issues. Labs today (CBC CMP TSH Vit B12). Can use these labs for CT AP scheduled for 5/11. Continue Sandostatin injections and follow up after imaging.     11/07/2020: doing very well, scans show stability. No nutritional deficiencies seen on labs. Continue with therapy regularly each month. Restage in 3-6 months.    03/26/2021: continues to do very well. RUQ pain likely related to either liver capsule stretching or possible anxiety. Stable disease on scans without evidence of carcinoid syndrome. Again discussion on MEN1 and screening for pituitary and thyroid disorders and declines. Also discussed genetics counseling and family but declines as well. Will see her in 2 months with scans in 4 months.         ICD-10-CM ICD-9-CM   1. Primary pancreatic neuroendocrine tumor  D3A.8 209.69     Therapeutic consent is obtained with all patients prior to the first infusion visit or dose. We discuss alternative treatments, expected side effects, and rare occurrences. Further the patient and I discuss the expected outcomes as well as contact number for anyone on our team.      No LOS data to display      Margreta Journey, MD/PhD  [place]  -----------------------------  Lab   No orders of the  defined types were placed in this encounter.    Imaging   Orders Placed This Encounter   Procedures    CT Abdomen And Pelvis With Contrast    CT Chest With  Contrast     Procedures   No orders of the defined types were placed in this encounter.    Other No orders of the defined types were placed in this encounter.

## 2021-04-01 ENCOUNTER — Ambulatory Visit
Admission: RE | Admit: 2021-04-01 | Discharge: 2021-04-01 | Disposition: A | Discharge status: Home / Self Care | Payer: TRICARE Prime—HMO | Attending: Hematology & Oncology | Admitting: Hematology & Oncology

## 2021-04-01 VITALS — BP 141/72 | HR 82 | Temp 96.9°F | Resp 16 | Ht 66.0 in | Wt 149.9 lb

## 2021-04-01 DIAGNOSIS — E340 Carcinoid syndrome: Secondary | ICD-10-CM | POA: Insufficient documentation

## 2021-04-01 DIAGNOSIS — C254 Malignant neoplasm of endocrine pancreas: Secondary | ICD-10-CM | POA: Insufficient documentation

## 2021-04-01 DIAGNOSIS — C787 Secondary malignant neoplasm of liver and intrahepatic bile duct: Secondary | ICD-10-CM | POA: Insufficient documentation

## 2021-04-01 MED ORDER — OCTREOTIDE ACETATE 30 MG IM KIT
30.0000 mg | PACK | Freq: Once | INTRAMUSCULAR | Status: AC
Start: 2021-04-01 — End: 2021-04-01
  Administered 2021-04-01 (×2): 30 mg via INTRAMUSCULAR
  Filled 2021-04-01: qty 1

## 2021-04-01 NOTE — Interdisciplinary (Signed)
Fast Track Nursing Note - [place]  65 year old female @ [facility] FAST TRACK for Sandostatin    ICD-10-CM ICD-9-CM   1. Liver metastases (CMS-HCC)  C78.7 197.7   2. Carcinoid syndrome (CMS-HCC)  E34.0 259.2   3. 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   Patient tolerated injection to Right Gluteus without complication.   Discharge Plan:  Discharge Mode: Ambulatory  Discharge Time: 1121  Accompanied by: Self  Discharged To: Home   Treatment Administration  Medications   octreotide (SANDOSTATIN LAR) depot injection 30 mg (30 mg IntraMUSCULAR Given 04/01/21 1118)

## 2021-04-29 ENCOUNTER — Ambulatory Visit
Admission: RE | Admit: 2021-04-29 | Discharge: 2021-04-29 | Disposition: A | Discharge status: Home / Self Care | Payer: TRICARE Prime—HMO | Attending: Hematology & Oncology | Admitting: Hematology & Oncology

## 2021-04-29 VITALS — BP 130/73 | HR 85 | Temp 97.7°F | Resp 17 | Ht 65.16 in | Wt 150.6 lb

## 2021-04-29 DIAGNOSIS — C254 Malignant neoplasm of endocrine pancreas: Secondary | ICD-10-CM

## 2021-04-29 DIAGNOSIS — C787 Secondary malignant neoplasm of liver and intrahepatic bile duct: Secondary | ICD-10-CM

## 2021-04-29 DIAGNOSIS — E340 Carcinoid syndrome: Secondary | ICD-10-CM | POA: Insufficient documentation

## 2021-04-29 MED ORDER — OCTREOTIDE ACETATE 30 MG IM KIT
30.0000 mg | PACK | Freq: Once | INTRAMUSCULAR | Status: AC
Start: 2021-04-29 — End: 2021-04-29
  Administered 2021-04-29 (×2): 30 mg via INTRAMUSCULAR
  Filled 2021-04-29: qty 1

## 2021-04-29 NOTE — Interdisciplinary (Signed)
Nurses Note: Patient in for Octreotide injection.  Vital signs stable and in no apparent distress.  Pt reports no nausea, vomiting, constipation, or diarrhea.  Pt given ongoing medication teaching.    Octreotide 30mg  given IM Left gleuteus. Site cleaned with alcohol prior to administration and covered with Band-Aid after administration.   Patient tolerated injection.  Patient discharged to home in no apparent distress.  Return to clinic in 4 week(s) for Octreotide.    Medications   octreotide (SANDOSTATIN LAR) depot injection 30 mg (30 mg IntraMUSCULAR Given 04/29/21 1042)   IM in Left Gluteus. Pt tolerated well.

## 2021-05-27 ENCOUNTER — Ambulatory Visit
Admission: RE | Admit: 2021-05-27 | Discharge: 2021-05-27 | Disposition: A | Discharge status: Home / Self Care | Payer: TRICARE Prime—HMO | Attending: Hematology & Oncology | Admitting: Hematology & Oncology

## 2021-05-27 VITALS — BP 134/76 | HR 82 | Temp 98.0°F | Resp 18 | Ht 65.16 in | Wt 151.9 lb

## 2021-05-27 DIAGNOSIS — C254 Malignant neoplasm of endocrine pancreas: Secondary | ICD-10-CM | POA: Insufficient documentation

## 2021-05-27 DIAGNOSIS — C787 Secondary malignant neoplasm of liver and intrahepatic bile duct: Secondary | ICD-10-CM

## 2021-05-27 DIAGNOSIS — E340 Carcinoid syndrome: Secondary | ICD-10-CM | POA: Insufficient documentation

## 2021-05-27 MED ORDER — OCTREOTIDE ACETATE 30 MG IM KIT
30.0000 mg | PACK | Freq: Once | INTRAMUSCULAR | Status: AC
Start: 2021-05-27 — End: 2021-05-27
  Administered 2021-05-27 (×2): 30 mg via INTRAMUSCULAR
  Filled 2021-05-27: qty 1

## 2021-05-27 NOTE — Interdisciplinary (Signed)
Patient here for SANDOSTATIN  injection. A/O x 4, VSS. Denies N/V/D. No c/o pain.    RN Ernie Hew assessed patient.    SANDOSTATIN injection given to left gluteus. Bandaid to site. Patient tolerated well.Pt is scheduled for the next three months for Sandostatin. Discharged ambulatory without any complaints.

## 2021-05-27 NOTE — Interdisciplinary (Signed)
Fast Track Nursing Note - [place]  65 year old female @ [facility] FAST TRACK for Sandostatin injection    ICD-10-CM ICD-9-CM   1. Liver metastases (CMS-HCC)  C78.7 197.7   2. Carcinoid syndrome (CMS-HCC)  E34.0 259.2   3. 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 VSS/afebrile  Treatment:   Sandostatin LAR. 30 mg every 28 days - injection given by Deneen Harts LVN  Treatment Administration  Medications   octreotide (SANDOSTATIN LAR) depot injection 30 mg (30 mg IntraMUSCULAR Incomplete 05/27/21 1037)

## 2021-05-29 ENCOUNTER — Ambulatory Visit: Payer: TRICARE Prime—HMO | Attending: Hematology & Oncology | Admitting: Hematology & Oncology

## 2021-05-29 ENCOUNTER — Other Ambulatory Visit (HOSPITAL_BASED_OUTPATIENT_CLINIC_OR_DEPARTMENT_OTHER): Payer: TRICARE Prime—HMO

## 2021-05-29 VITALS — BP 152/65 | HR 50 | Temp 98.0°F | Resp 17 | Ht 65.16 in | Wt 150.0 lb

## 2021-05-29 DIAGNOSIS — Z09 Encounter for follow-up examination after completed treatment for conditions other than malignant neoplasm: Secondary | ICD-10-CM

## 2021-05-29 DIAGNOSIS — D3A8 Other benign neuroendocrine tumors: Secondary | ICD-10-CM | POA: Insufficient documentation

## 2021-05-29 LAB — CBC WITH DIFF, BLOOD
ANC-Automated: 6.2 10*3/uL (ref 1.6–7.0)
ANC-Instrument: 6.2 10*3/uL (ref 1.6–7.0)
Abs Basophils: 0 10*3/uL (ref <0.2)
Abs Eosinophils: 0.1 10*3/uL (ref 0.0–0.5)
Abs Lymphs: 1.6 10*3/uL (ref 0.8–3.1)
Abs Monos: 0.6 10*3/uL (ref 0.2–0.8)
Basophils: 0 %
Eosinophils: 1 %
Hct: 42.2 % (ref 34.0–45.0)
Hgb: 14.3 gm/dL (ref 11.2–15.7)
Lymphocytes: 18 %
MCH: 29.7 pg (ref 26.0–32.0)
MCHC: 33.9 g/dL (ref 32.0–36.0)
MCV: 87.7 um3 (ref 79.0–95.0)
MPV: 10.2 fL (ref 9.4–12.4)
Monocytes: 8 %
Plt Count: 214 10*3/uL (ref 140–370)
RBC: 4.81 10*6/uL (ref 3.90–5.20)
RDW: 13 % (ref 12.0–14.0)
Segs: 73 %
WBC: 8.6 10*3/uL (ref 4.0–10.0)

## 2021-05-29 LAB — COMPREHENSIVE METABOLIC PANEL, BLOOD
ALT (SGPT): 41 U/L — ABNORMAL HIGH (ref 0–33)
AST (SGOT): 29 U/L (ref 0–32)
Albumin: 4.7 g/dL (ref 3.5–5.2)
Alkaline Phos: 153 U/L — ABNORMAL HIGH (ref 40–130)
Anion Gap: 10 mmol/L (ref 7–15)
BUN: 13 mg/dL (ref 8–23)
Bicarbonate: 28 mmol/L (ref 22–29)
Bilirubin, Tot: 0.38 mg/dL (ref <1.2)
Calcium: 9.6 mg/dL (ref 8.5–10.6)
Chloride: 100 mmol/L (ref 98–107)
Creatinine: 0.55 mg/dL (ref 0.51–0.95)
Glucose: 121 mg/dL — ABNORMAL HIGH (ref 70–99)
Potassium: 4.2 mmol/L (ref 3.5–5.1)
Sodium: 138 mmol/L (ref 136–145)
Total Protein: 8.1 g/dL — ABNORMAL HIGH (ref 6.0–8.0)
eGFR Based on CKD-EPI 2021 Equation: >60 mL/min

## 2021-05-29 LAB — CEA TUMOR, BLOOD: CEA Tumor: 8.9 ng/mL

## 2021-05-29 NOTE — Progress Notes (Signed)
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 NVeverly Fellsat BBon[place]on octreotide since 08/2017. Previous to this noted watery diarrhea and had increase from 265mto 3025mith good resolution. Has had no abdominal pain or carcinoid symptoms. Recent CT CAP and MRI of pancreas October 2020 found new hepatic lesions and slight interval growth compared with 1 year prior.    She is here to discuss additional treatment options for her and overall prognosis.     05/22/2020: presents alone. [facility] there is progression on her MRI scans but they continued on octreotide 59m48m she was clinically well. Otherwise notes diarrhea if her injection is delayed. [facility] then will need 06/2020 injection from that point forward.     07/18/2020: presents alone. Recent ED visit (1/24) with abdominal pain on right abdomen subcostal, severe when lying down. Restaged at that time showing minor right pleural thickening above right hepatic disease. Scattered 4mm 2mules.No pain has returned, no carcinoid issues.    10/17/2020:  Presents alone. On monthly Sandostatin LAR, tolerating well. Denies flushing, abdominal pain, or diarrhea.    11/07/2020: presents alone today. Restaging scans completed with SD. No diarrhea, flushing, pain.    03/26/2021: presents alone today. Stress about scans but stable since 10/2020. OK with three times a year scans. No issues with carcinoid syndrome. Has RUQ pain stable, believes from  stress. Discussion on injection and depot with previous scar tissue.    05/29/2021: presents today alone.  Has no particular symptoms of carcinoid syndrome.  Her last scans done September 2022 show stable disease without any progression.    Review of systems: Less anxiety with scans showing SD again today, all other systems reviewed and are negative except for what is described in the history of present illness.    Oncologic Timeline:  09/09/2017-04/06/2019: Octreotide Qmonthly  04/27/2019: [facility] Consult  03/2020: Per report, progression on MRI, continues on octreotide 59mg 2mhly  05/22/2020: F/U Dr. Gyselle Matthew Ane Payment/2022: ED visit with pain, CTA negative  07/18/2020: RV Dr. Delcia Spitzley Ane Payment/222: MRI AP- hepatic lesions similar in size  10/17/2020: RV Dr Ein Rijo Ane Payment/2022: RV Dr. Kaleth Koy Ane Payment/2022: RV  05/26/2021: RV  07/27/2020: RV with new staging    -----------------------------  Past Medical History:   Past Medical History:   Diagnosis Date   . Liver metastases (CMS-HCC) 05/05/2019       Medications:   Current Outpatient Medications:   .  COVID-19 mRNA vaccine (PFIZER) 30 MCG/0.3ML injection, , Disp: , Rfl:   .  octreotide (SANDOSTATIN LAR) 30 MG injection, 30 mg by IntraMUSCULAR route every 28 days., Disp: , Rfl:     Allergies:   No Known Allergies    Social history:   Alcohol: None  Tobacco: None  Drugs: None  Living Situation: Lives with husband, daughter is a PhD [employer]Personal [employer]  Family history:  Father: Parkinson/Lewy Body Dementia  Cousins: breast, brain, colon cancer and lymphoma    -----------------------------  Physical exam:  Vitals:BP 152/65 (BP Location: Left arm, BP Patient Position: Sitting, BP cuff size: Regular)   Pulse 50   Temp 98 F (36.7 C) (Temporal)   Resp 17   Ht 5' 5.16" (1.655 m)   Wt 68 kg (150 lb)   SpO2 98%   BMI 24.84 kg/m   Wt Readings from Last 3 Encounters:   05/29/21 68 kg (150 lb)   05/27/21 68.9 kg (151 lb 14.4 oz)   04/29/21 68.3 kg (150 lb 9.2 oz)      PHYSICAL EXAM   (unchanged)  ECOG: 0  GENERAL APPEARANCE: The patient is an alert, cooperative female in no acute distress.  SKIN: No lesions or rashes. No jaundice  EYES: PERRLA, EOMs are intact. No icterus.   EAR, NOSE, AND THROAT: The oropharynx is clear and moist.   LYMPH NODES: No appreciable observable lymphadenopathy  CHEST: The lungs are clear to auscultation. Normal respiratory effort  HEART: RRR S1 and S2 are normal.  There are no murmurs or extra sounds.  ABDOMEN:  The abdomen is soft and nontender. +BS. There is no hepatosplenomegaly.   EXTREMITES: There is no edema or cyanosis. Joints are normal without redness or swelling.  NEURO:  Mental status is normal.  No focal neurologic findings.           -----------------------------  Labs: Reviewed by myself  Lab Results   Component Value Date    WBC 8.6 05/29/2021    RBC 4.81 05/29/2021    HGB 14.3 05/29/2021    HCT 42.2 05/29/2021    MCV 87.7 05/29/2021    MCHC 33.9 05/29/2021    RDW 13.0 05/29/2021    PLT 214 05/29/2021    MPV 10.2 05/29/2021     Lab Results   Component Value Date    BUN 13 05/29/2021    CREAT 0.55 05/29/2021    CL 100 05/29/2021    NA 138 05/29/2021    K 4.2 05/29/2021    [place] 9.6 05/29/2021    TBILI 0.38 05/29/2021    ALB 4.7 05/29/2021    TP 8.1 (H) 05/29/2021    AST 29 05/29/2021    ALK 153 (H) 05/29/2021    BICARB 28 05/29/2021    ALT 41 (H) 05/29/2021    GLU 121 (H) 05/29/2021     Pancreatic Polypeptide 07/2017: 837  Chromogranin A 07/2017: 1  CA19-9 07/2017: 8  Alk Phos 11/2018: 176    Colonoscopy    Radiology: Reviewed by myself in conjunction with radiology report  CT Chest With Contrast    Result Date: 03/19/2021  IMPRESSION: No evidence of intrathoracic metastatic disease.     CT Abdomen And Pelvis With Contrast    Result Date: 03/20/2021  IMPRESSION: CT scan of the abdomen and pelvis with IV contrast Stable size of the primary pancreatic tail mass and index hepatic metastases     03[facility] Report      04/04/2019 MRI  Pancreas    CT Chest 03/27/2019  - No evidence of thoracic metastatic disease    CT A/P 03/24/2019    PET Gallium 68  - Primary pancreatic mass with intense radiotracer uptake and multiple hepatic metastatic lesions    Pathology:    Ki67 3%    Molecular:    PDL1 0%      -----------------------------  Assessment & Plan:  Hunter Pinkard is a 65 year old female presenting with metastatic PNET with progression on 37m Octreotide Monthly.    We discussed with the patient and their family the natural course of NET. We discussed how many of these come from GI sources including the pancreas, colon, and stomach. Occasionally they are seen from the lung as well. We discussed that they can be indolent and slow growing or quickly capable of rapid growth and spread. Treatment options for symptom relief include somatostatin analogues. Although fewer than 10% of patients see tumor shrinkage with these drugs, prolonged stable disease is seen in 66% which delays progression.     We discussed that if there was interval growth on the next imaging further options include everolimus, CAPTEM, TKIs (Sunitinib, sorafenib, pazopanib, and cabozantinib) and Lutathera. We will also evaluate the ability to send tissue for NGS today with [facility]    Ultimately we would continue octreotide at this time and add everolimus. We discussed evaluating in 2 months to see if growth stopped or continued. After this we would then recommend either CAPTEM or Lutathera. We discussed the side effects dermatologically of everolimus and instructed the patient to pre-empt the rash with a good face hygiene regimen.     Would have an RV in January to discuss restaging scans.    05/22/2020: We [facility] now given 3 month interval. She previously had a PET GA68 to follow as well for [place] We discussed increasing octreotide or adding regorafenib/everolimus in addition next. We will schedule her for her 06/2020 injection as well to reduce  her diarrheal burden. RV monthly until stability. NGS completed as above and added to chart. 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 We discussed MEN can be used as a screening tool for future cancers and she will talk to them but doesn't want to make decision now.    07/18/2020: pain may be referred from hepatic disease, no obvious right effusion needing thoracentesis. Pain has resolved and no diarrheal symptoms. Would recommend quick interval scan in 2 months time, mid March. Continue injections of octreotide.    10/17/2020: No symptoms of carcinoid. Tolerating Sandostatin LAR without issues. Labs today (CBC CMP TSH Vit B12). Can use these labs for CT AP scheduled for 5/11. Continue Sandostatin injections and follow up after imaging.     11/07/2020: doing very well, scans show stability. No nutritional deficiencies seen on labs. Continue with therapy regularly each month. Restage in 3-6 months.    03/26/2021: continues to do very well. RUQ pain likely related to either liver capsule stretching or possible anxiety. Stable disease on scans without evidence of carcinoid syndrome. Again discussion on MEN1 and screening for pituitary and thyroid disorders and declines. Also discussed genetics counseling and family but declines as well. Will see her in 2 months with scans in 4 months.     05/29/2021:  Continuing to do very well on current dosage and schedule.  We will restage in 6 months' time.        ICD-10-CM ICD-9-CM   1. Primary pancreatic neuroendocrine tumor  D3A.8 209.69   2. Post chemo evaluation  [id number]    Therapeutic consent is obtained with all patients prior to the first infusion visit or dose. We discuss alternative treatments, expected side effects, and rare occurrences.  Further the patient and I discuss the expected outcomes as well as contact number for  anyone on our team.      I spent a total of 30 minutes on the day of the visit.      Margreta Journey, MD/PhD  [place]  -----------------------------  Lab   Orders Placed This Encounter   Procedures   . CBC w/ Diff Lavender   . CEA Tumor - See Instructions   . Comprehensive Metabolic Panel     Imaging   Orders Placed This Encounter   Procedures   . CT Chest With Contrast   . CT Abdomen And Pelvis With Contrast     Procedures   No orders of the defined types were placed in this encounter.    Other No orders of the defined types were placed in this encounter.

## 2021-06-24 ENCOUNTER — Ambulatory Visit
Admission: RE | Admit: 2021-06-24 | Discharge: 2021-06-24 | Disposition: A | Discharge status: Home / Self Care | Payer: TRICARE Prime—HMO | Attending: Hematology & Oncology | Admitting: Hematology & Oncology

## 2021-06-24 VITALS — BP 155/62 | HR 78 | Temp 97.7°F | Resp 16 | Ht 65.16 in | Wt 151.9 lb

## 2021-06-24 DIAGNOSIS — C254 Malignant neoplasm of endocrine pancreas: Secondary | ICD-10-CM

## 2021-06-24 DIAGNOSIS — E340 Carcinoid syndrome: Secondary | ICD-10-CM

## 2021-06-24 DIAGNOSIS — C787 Secondary malignant neoplasm of liver and intrahepatic bile duct: Secondary | ICD-10-CM | POA: Insufficient documentation

## 2021-06-24 MED ORDER — OCTREOTIDE ACETATE 30 MG IM KIT
30.0000 mg | PACK | Freq: Once | INTRAMUSCULAR | Status: AC
Start: 2021-06-24 — End: 2021-06-24
  Administered 2021-06-24 (×2): 30 mg via INTRAMUSCULAR
  Filled 2021-06-24: qty 1

## 2021-06-24 NOTE — Interdisciplinary (Signed)
Octreotide injection:   Pt given ongoing medication teaching.    Octreotide given to RIGHT glute IM. Injection site WDL and CDI.  Patient tolerated injection.    Medications   octreotide (SANDOSTATIN LAR) depot injection 30 mg (30 mg IntraMUSCULAR Given 06/24/21 1053)          06/24/21  1025   BP: 155/62   Pulse: 78   Resp: 16   Temp: 97.7 F (36.5 C)   SpO2: 99%

## 2021-07-18 ENCOUNTER — Telehealth (HOSPITAL_BASED_OUTPATIENT_CLINIC_OR_DEPARTMENT_OTHER): Payer: Self-pay

## 2021-07-18 NOTE — Telephone Encounter (Signed)
VM from patient stating she has scheduled her CT 07/25/21.    Patient wants to know when she should schedule her follow up appointment with Dr. Ane Payment.    Patient also wants to know if she should get her labs drawn prior as well.    Will route to provider for input and next steps.    Ovid Curd RN     Nurse Case Manager covering for Baldo Daub 07/18/21

## 2021-07-21 ENCOUNTER — Encounter (HOSPITAL_BASED_OUTPATIENT_CLINIC_OR_DEPARTMENT_OTHER): Payer: Self-pay | Admitting: Nurse Practitioner

## 2021-07-22 ENCOUNTER — Ambulatory Visit
Admission: RE | Admit: 2021-07-22 | Discharge: 2021-07-22 | Disposition: A | Discharge status: Home / Self Care | Payer: TRICARE Prime—HMO | Attending: Nurse Practitioner | Admitting: Nurse Practitioner

## 2021-07-22 VITALS — BP 140/65 | HR 77 | Temp 97.9°F | Resp 19 | Ht 65.16 in | Wt 150.6 lb

## 2021-07-22 DIAGNOSIS — C787 Secondary malignant neoplasm of liver and intrahepatic bile duct: Secondary | ICD-10-CM | POA: Insufficient documentation

## 2021-07-22 DIAGNOSIS — E340 Carcinoid syndrome: Secondary | ICD-10-CM

## 2021-07-22 DIAGNOSIS — C254 Malignant neoplasm of endocrine pancreas: Secondary | ICD-10-CM | POA: Insufficient documentation

## 2021-07-22 MED ORDER — OCTREOTIDE ACETATE 30 MG IM KIT
30.0000 mg | PACK | Freq: Once | INTRAMUSCULAR | Status: AC
Start: 2021-07-22 — End: 2021-07-22
  Administered 2021-07-22 (×2): 30 mg via INTRAMUSCULAR
  Filled 2021-07-22: qty 1

## 2021-07-22 NOTE — Interdisciplinary (Signed)
Patient here for Sandostatin injection. A/O x 4, VSS. Denies N/V/D. No c/o pain.    Sandostatin injection given to LEFT gluteus. Bandaid to site. Patient tolerated well.    Ambulated out in stable condition.    Medications   octreotide (SANDOSTATIN LAR) depot injection 30 mg (30 mg IntraMUSCULAR Given 07/22/21 1040)

## 2021-07-25 ENCOUNTER — Ambulatory Visit (HOSPITAL_BASED_OUTPATIENT_CLINIC_OR_DEPARTMENT_OTHER): Payer: TRICARE Prime—HMO

## 2021-08-13 ENCOUNTER — Encounter (HOSPITAL_BASED_OUTPATIENT_CLINIC_OR_DEPARTMENT_OTHER): Payer: Self-pay | Admitting: Nurse Practitioner

## 2021-08-13 ENCOUNTER — Telehealth (HOSPITAL_BASED_OUTPATIENT_CLINIC_OR_DEPARTMENT_OTHER): Payer: Self-pay | Admitting: Hematology & Oncology

## 2021-08-13 ENCOUNTER — Other Ambulatory Visit: Payer: TRICARE Prime—HMO | Attending: Nurse Practitioner

## 2021-08-13 DIAGNOSIS — D3A8 Other benign neuroendocrine tumors: Secondary | ICD-10-CM

## 2021-08-13 DIAGNOSIS — C7A8 Other malignant neuroendocrine tumors: Secondary | ICD-10-CM

## 2021-08-13 LAB — CBC WITH DIFF, BLOOD
ANC-Automated: 3.2 10*3/uL (ref 1.6–7.0)
ANC-Instrument: 3.2 10*3/uL (ref 1.6–7.0)
Abs Basophils: 0 10*3/uL (ref <0.2)
Abs Eosinophils: 0.1 10*3/uL (ref 0.0–0.5)
Abs Lymphs: 1.9 10*3/uL (ref 0.8–3.1)
Abs Monos: 0.5 10*3/uL (ref 0.2–0.8)
Basophils: 1 %
Eosinophils: 2 %
Hct: 38.7 % (ref 34.0–45.0)
Hgb: 13.4 gm/dL (ref 11.2–15.7)
Lymphocytes: 33 %
MCH: 30 pg (ref 26.0–32.0)
MCHC: 34.6 g/dL (ref 32.0–36.0)
MCV: 86.8 um3 (ref 79.0–95.0)
MPV: 10.2 fL (ref 9.4–12.4)
Monocytes: 9 %
Plt Count: 195 10*3/uL (ref 140–370)
RBC: 4.46 10*6/uL (ref 3.90–5.20)
RDW: 12.8 % (ref 12.0–14.0)
Segs: 56 %
WBC: 5.7 10*3/uL (ref 4.0–10.0)

## 2021-08-13 LAB — COMPREHENSIVE METABOLIC PANEL, BLOOD
ALT (SGPT): 49 U/L — ABNORMAL HIGH (ref 0–33)
AST (SGOT): 35 U/L — ABNORMAL HIGH (ref 0–32)
Albumin: 4.2 g/dL (ref 3.5–5.2)
Alkaline Phos: 185 U/L — ABNORMAL HIGH (ref 40–130)
Anion Gap: 11 mmol/L (ref 7–15)
BUN: 10 mg/dL (ref 8–23)
Bicarbonate: 29 mmol/L (ref 22–29)
Bilirubin, Tot: 0.29 mg/dL (ref <1.2)
Calcium: 9.2 mg/dL (ref 8.5–10.6)
Chloride: 102 mmol/L (ref 98–107)
Creatinine: 0.53 mg/dL (ref 0.51–0.95)
Glucose: 95 mg/dL (ref 70–99)
Potassium: 4 mmol/L (ref 3.5–5.1)
Sodium: 142 mmol/L (ref 136–145)
Total Protein: 7.4 g/dL (ref 6.0–8.0)
eGFR Based on CKD-EPI 2021 Equation: >60 mL/min

## 2021-08-13 NOTE — Telephone Encounter (Signed)
LAbs

## 2021-08-14 ENCOUNTER — Ambulatory Visit
Admission: RE | Admit: 2021-08-14 | Discharge: 2021-08-14 | Disposition: A | Discharge status: Home / Self Care | Payer: TRICARE Prime—HMO | Attending: Hematology & Oncology | Admitting: Hematology & Oncology

## 2021-08-14 DIAGNOSIS — D3A8 Other benign neuroendocrine tumors: Secondary | ICD-10-CM | POA: Insufficient documentation

## 2021-08-14 DIAGNOSIS — K8689 Other specified diseases of pancreas: Secondary | ICD-10-CM

## 2021-08-14 DIAGNOSIS — C787 Secondary malignant neoplasm of liver and intrahepatic bile duct: Secondary | ICD-10-CM

## 2021-08-14 DIAGNOSIS — K5000 Crohn's disease of small intestine without complications: Secondary | ICD-10-CM

## 2021-08-14 MED ORDER — IOHEXOL 350 MG/ML IV SOLN
100.0000 mL | Freq: Once | INTRAVENOUS | Status: AC
Start: 2021-08-14 — End: 2021-08-14
  Administered 2021-08-14 (×2): 100 mL via INTRAVENOUS

## 2021-08-17 ENCOUNTER — Encounter (HOSPITAL_BASED_OUTPATIENT_CLINIC_OR_DEPARTMENT_OTHER): Payer: Self-pay | Admitting: Nurse Practitioner

## 2021-08-19 ENCOUNTER — Ambulatory Visit
Admission: RE | Admit: 2021-08-19 | Discharge: 2021-08-19 | Disposition: A | Discharge status: Home / Self Care | Payer: TRICARE Prime—HMO | Attending: Hematology & Oncology | Admitting: Hematology & Oncology

## 2021-08-19 ENCOUNTER — Ambulatory Visit (HOSPITAL_BASED_OUTPATIENT_CLINIC_OR_DEPARTMENT_OTHER): Payer: TRICARE Prime—HMO | Admitting: Hematology & Oncology

## 2021-08-19 VITALS — BP 153/69 | HR 92 | Temp 97.6°F | Resp 16 | Wt 152.6 lb

## 2021-08-19 DIAGNOSIS — C787 Secondary malignant neoplasm of liver and intrahepatic bile duct: Secondary | ICD-10-CM | POA: Insufficient documentation

## 2021-08-19 DIAGNOSIS — E340 Carcinoid syndrome: Secondary | ICD-10-CM | POA: Insufficient documentation

## 2021-08-19 DIAGNOSIS — C254 Malignant neoplasm of endocrine pancreas: Secondary | ICD-10-CM

## 2021-08-19 DIAGNOSIS — D3A8 Other benign neuroendocrine tumors: Secondary | ICD-10-CM

## 2021-08-19 DIAGNOSIS — Z09 Encounter for follow-up examination after completed treatment for conditions other than malignant neoplasm: Secondary | ICD-10-CM

## 2021-08-19 MED ORDER — OCTREOTIDE ACETATE 30 MG IM KIT
30.0000 mg | PACK | Freq: Once | INTRAMUSCULAR | Status: AC
Start: 2021-08-19 — End: 2021-08-19
  Administered 2021-08-19 (×2): 30 mg via INTRAMUSCULAR
  Filled 2021-08-19: qty 1

## 2021-08-19 NOTE — Interdisciplinary (Addendum)
Patient arrived ambulatory in stable condition. Denies nausea, vomiting, diarrhea, or constipation.   Denies fevers or recent illness    Ria Comment, RN completed assessment prior to injection.   Gave Sandostatin LAR 30 mg IM Injection to the Right Gluteus. Band-aid applied.     Pt requested appts for Sandostatin injection at 10:30 on 09/16/21, 10/14/21 and 11/11/21.     Patient tolerated well and discharged in stable condition to lobby awaiting lab results.   Infusion RN to review labs    Medications   octreotide (SANDOSTATIN LAR) depot injection 30 mg (has no administration in time range)     Hunt Oris, LVN  08/19/21

## 2021-08-19 NOTE — Progress Notes (Signed)
GI Oncology Progress    Name: Jenna Bridges  DOB: May 18, 1956  Age: 66 year old  Sex: female  MRN: [id number]  Date of Service: 08/19/2021     Chief Complaint: PNET    HPI: Jenna Bridges is a 66 year old female presenting with metastatic PNET (Ki67 of 3%) on Octreotide 44m monthly until 04/06/2019 when interval scans showed [facility] [place]     History of PNET following with Dr. NVeverly Fellsat BGardenon octreotide since 08/2017. Previous to this noted watery diarrhea and had increase from 26mto 3047mith good resolution. Has had no abdominal pain or carcinoid symptoms. Recent CT CAP and MRI of pancreas October 2020 found new hepatic lesions and slight interval growth compared with 1 year prior.    She is here to discuss additional treatment options for her and overall prognosis.     05/22/2020: presents alone. [facility] and [facility] there is progression on her MRI scans but they continued on octreotide 6m66m she was clinically well. Otherwise notes diarrhea if her injection is delayed. [facility] then will need 06/2020 injection from that point forward.     07/18/2020: presents alone. Recent ED visit (1/24) with abdominal pain on right abdomen subcostal, severe when lying down. Restaged at that time showing minor right pleural thickening above right hepatic disease. Scattered 4mm 62mules.No pain has returned, no carcinoid issues.    10/17/2020:  Presents alone. On monthly Sandostatin LAR, tolerating well. Denies flushing, abdominal pain, or diarrhea.    11/07/2020: presents alone today. Restaging scans completed with SD. No diarrhea, flushing, pain.    03/26/2021: presents alone today. Stress about scans but stable since 10/2020. OK with three times a year scans. No issues with carcinoid syndrome. Has RUQ pain stable, believes from  stress. Discussion on injection and depot with previous scar tissue.    08/19/2021: presents today alone on tele.  Has no particular symptoms of carcinoid syndrome.  Her last scans done 07/2021 show stable disease without any progression when considering variations of technique. She continues walking well and has less anxiety with knowledge of scans. Plans to move to Medicare this June and believes scan authorization will be easier after this date.     Review of systems: Less anxiety with scans showing SD again today, all other systems reviewed and are negative except for what is described in the history of present illness.    Oncologic Timeline:  09/09/2017-04/06/2019: Octreotide Qmonthly  04/27/2019: [place] [employer] Consult  03/2020: Per report, progression on MRI, continues on octreotide 6mg 25mhly  05/22/2020: F/U Dr. Leyda Vanderwerf Ane Payment/2022: ED visit with pain, CTA negative  07/18/2020: RV Dr. Andreu Drudge Ane Payment/222: MRI AP- hepatic lesions similar in size  10/17/2020: RV Dr Maruice Pieroni Ane Payment/2022: RV Dr. Akeyla Molden Ane Payment/2022: RV  05/26/2021: RV  07/27/2020: RV with new staging with SD in liver to improvement in primary    -----------------------------  Past Medical History:   Past Medical History:   Diagnosis Date    Liver metastases (CMS-HCC) 05/05/2019       Medications:   Current Outpatient Medications:     COVID-19 mRNA vaccine (PFIZER) 30 MCG/0.3ML injection, , Disp: , Rfl:     octreotide (SANDOSTATIN LAR) 30 MG injection, 30 mg by IntraMUSCULAR route every  28 days., Disp: , Rfl:   No current facility-administered medications for this visit.    Allergies:   No Known Allergies    Social history:   Alcohol: None  Tobacco: None  Drugs: None  Living Situation: Lives with husband, daughter is a PhD Personal assistant at [employer] history:  Father: Parkinson/Lewy Body Dementia  Cousins: breast, brain, colon cancer and lymphoma    -----------------------------  Physical exam:  Vitals:There were no vitals taken for this  visit.  Wt Readings from Last 3 Encounters:   08/19/21 69.2 kg (152 lb 8.9 oz)   07/22/21 68.3 kg (150 lb 9.2 oz)   06/24/21 68.9 kg (151 lb 14.4 oz)     PHYSICAL EXAM   (unchanged)  ECOG: 0  GENERAL APPEARANCE: The patient is an alert, cooperative female in no acute distress.  SKIN: No lesions or rashes. No jaundice  EYES: PERRLA, EOMs are intact. No icterus.   EAR, NOSE, AND THROAT: The oropharynx is clear and moist.   LYMPH NODES: No appreciable observable lymphadenopathy  CHEST: The lungs are clear to auscultation. Normal respiratory effort  HEART: RRR S1 and S2 are normal.  There are no murmurs or extra sounds.  ABDOMEN:  The abdomen is soft and nontender. +BS. There is no hepatosplenomegaly.   EXTREMITES: There is no edema or cyanosis. Joints are normal without redness or swelling.  NEURO:  Mental status is normal.  No focal neurologic findings.           -----------------------------  Labs: Reviewed by myself  Lab Results   Component Value Date    WBC 5.7 08/13/2021    RBC 4.46 08/13/2021    HGB 13.4 08/13/2021    HCT 38.7 08/13/2021    MCV 86.8 08/13/2021    MCHC 34.6 08/13/2021    RDW 12.8 08/13/2021    PLT 195 08/13/2021    MPV 10.2 08/13/2021     Lab Results   Component Value Date    BUN 10 08/13/2021    CREAT 0.53 08/13/2021    CL 102 08/13/2021    NA 142 08/13/2021    K 4.0 08/13/2021    [place] 9.2 08/13/2021    TBILI 0.29 08/13/2021    ALB 4.2 08/13/2021    TP 7.4 08/13/2021    AST 35 (H) 08/13/2021    ALK 185 (H) 08/13/2021    BICARB 29 08/13/2021    ALT 49 (H) 08/13/2021    GLU 95 08/13/2021     Pancreatic Polypeptide 07/2017: 837  Chromogranin A 07/2017: 1  CA19-9 07/2017: 8  Alk Phos 11/2018: 176    Colonoscopy    Radiology: Reviewed by myself in conjunction with radiology report  CT Chest With Contrast    Result Date: 08/15/2021  IMPRESSION: No evidence of metastatic disease in the chest. Please refer to the separately dictated reports for findings on the concurrently obtained CT abdomen and pelvis.     CT  Abdomen And Pelvis With Contrast    Result Date: 08/15/2021  IMPRESSION: CT scan of the abdomen and pelvis with IV contrast. Hepatic metastases as measured without definite new lesions Slightly decreased size pancreatic tail mass Terminal ileitis, nonspecific Please refer to the separately dictated reports for findings on the concurrently obtained CT chest.     03/2020 [facility] Report      04/04/2019 MRI Pancreas    CT Chest 03/27/2019  - No evidence of thoracic metastatic disease    CT A/P  03/24/2019    PET Gallium 68  - Primary pancreatic mass with intense radiotracer uptake and multiple hepatic metastatic lesions    Pathology:    Ki67 3%    Molecular:    PDL1 0%      -----------------------------  Assessment & Plan:  Sophi Calligan is a 66 year old female presenting with metastatic PNET with progression on 52m Octreotide Monthly.    We discussed with the patient and their family the natural course of NET. We discussed how many of these come from GI sources including the pancreas, colon, and stomach. Occasionally they are seen from the lung as well. We discussed that they can be indolent and slow growing or quickly capable of rapid growth and spread. Treatment options for symptom relief include somatostatin analogues. Although fewer than 10% of patients see tumor shrinkage with these drugs, prolonged stable disease is seen in 66% which delays progression.     We discussed that if there was interval growth on the next imaging further options include everolimus, CAPTEM, TKIs (Sunitinib, sorafenib, pazopanib, and cabozantinib) and Lutathera. We will also evaluate the ability to send tissue for NGS today with [facility]    Ultimately we would continue octreotide at this time and add everolimus. We discussed evaluating in 2 months to see if growth stopped or continued. After this we would then recommend either CAPTEM or Lutathera. We discussed the side effects dermatologically of everolimus and instructed the patient to pre-empt  the rash with a good face hygiene regimen.     Would have an RV in January to discuss restaging scans.    05/22/2020: We [facility] now given 3 month interval. She previously had a PET GA68 to follow as well for [place] We discussed increasing octreotide or adding regorafenib/everolimus in addition next. We will schedule her for her 06/2020 injection as well to reduce her diarrheal burden. RV monthly until stability. NGS completed as above and added to chart. 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 We discussed MEN can be used as a screening tool for future cancers and she will talk to them but doesn't want to make decision now.    07/18/2020: pain may be referred from hepatic disease, no obvious right effusion needing thoracentesis. Pain has resolved and no diarrheal symptoms. Would recommend quick interval scan in 2 months time, mid March. Continue injections of octreotide.    10/17/2020: No symptoms of carcinoid. Tolerating Sandostatin LAR without issues. Labs today (CBC CMP TSH Vit B12). Can use these labs for CT AP scheduled for 5/11. Continue Sandostatin injections and follow up after imaging.     11/07/2020: doing very well, scans show stability. No nutritional deficiencies seen on labs. Continue with therapy regularly each month. Restage in 3-6 months.    03/26/2021: continues to do very well. RUQ pain likely related to either liver capsule stretching or possible anxiety. Stable disease on scans without evidence of carcinoid syndrome. Again discussion on MEN1 and screening for pituitary and thyroid disorders and declines. Also discussed genetics counseling and family but declines as well. Will see her in 2 months with scans in 4 months.     08/19/2021:  Continuing to do very well on current dosage and schedule.  We will restage in  6 months' time given she will be on Medicare  at that date. Discussed continuing her active walking regimen and maintain weight. Would not change her current treatment plan unless growing disease or new lesions. At that time could consider add on targeted therapy or local therapies depending on observation.         ICD-10-CM ICD-9-CM   1. Primary pancreatic neuroendocrine tumor  D3A.8 209.69   2. Post chemo evaluation  [id number]     Therapeutic consent is obtained with all patients prior to the first infusion visit or dose. We discuss alternative treatments, expected side effects, and rare occurrences. Further the patient and I discuss the expected outcomes as well as contact number for anyone on our team.      No LOS data to display      Margreta Journey, MD/PhD  [place]  -----------------------------  Lab   Orders Placed This Encounter   Procedures    CBC w/ Diff Lavender    CEA Tumor - See Instructions    Comprehensive Metabolic Panel     Imaging   Orders Placed This Encounter   Procedures    CT Chest With Contrast    CT Abdomen And Pelvis With Contrast     Procedures   No orders of the defined types were placed in this encounter.    Other No orders of the defined types were placed in this encounter.

## 2021-09-16 ENCOUNTER — Ambulatory Visit
Admission: RE | Admit: 2021-09-16 | Discharge: 2021-09-16 | Disposition: A | Discharge status: Home / Self Care | Payer: TRICARE Prime—HMO | Attending: Hematology & Oncology | Admitting: Hematology & Oncology

## 2021-09-16 VITALS — BP 146/71 | HR 76 | Temp 97.0°F | Resp 16

## 2021-09-16 DIAGNOSIS — C254 Malignant neoplasm of endocrine pancreas: Secondary | ICD-10-CM | POA: Insufficient documentation

## 2021-09-16 DIAGNOSIS — C787 Secondary malignant neoplasm of liver and intrahepatic bile duct: Secondary | ICD-10-CM | POA: Insufficient documentation

## 2021-09-16 DIAGNOSIS — E340 Carcinoid syndrome: Secondary | ICD-10-CM | POA: Insufficient documentation

## 2021-09-16 MED ORDER — OCTREOTIDE ACETATE 30 MG IM KIT
30.0000 mg | PACK | Freq: Once | INTRAMUSCULAR | Status: AC
Start: 2021-09-16 — End: 2021-09-16
  Administered 2021-09-16 (×2): 30 mg via INTRAMUSCULAR
  Filled 2021-09-16: qty 1

## 2021-09-16 NOTE — Interdisciplinary (Signed)
Patient arrived ambulatory in stable condition. Denies nausea, vomiting, diarrhea, or constipation.   Denies fevers or recent illness     09/16/21  1034   BP: 146/71   Pulse: 76   Resp: 16   Temp: 97 F (36.1 C)   SpO2: 97%       Ria Comment, RN completed assessment prior to injection.   Gave Sandostatin LAR 30 mg IM Injection to the LEFT Gluteus. Band-aid applied.       Patient tolerated well and discharged in stable condition home    Medications   octreotide (SANDOSTATIN LAR) depot injection 30 mg (30 mg IntraMUSCULAR Given 09/16/21 1028)

## 2021-10-14 ENCOUNTER — Ambulatory Visit
Admission: RE | Admit: 2021-10-14 | Discharge: 2021-10-14 | Disposition: A | Discharge status: Home / Self Care | Payer: TRICARE Prime—HMO | Attending: Hematology & Oncology | Admitting: Hematology & Oncology

## 2021-10-14 VITALS — BP 138/77 | HR 80 | Temp 98.7°F | Resp 17 | Ht 65.16 in | Wt 152.8 lb

## 2021-10-14 DIAGNOSIS — E340 Carcinoid syndrome: Secondary | ICD-10-CM

## 2021-10-14 DIAGNOSIS — C254 Malignant neoplasm of endocrine pancreas: Secondary | ICD-10-CM | POA: Insufficient documentation

## 2021-10-14 DIAGNOSIS — C787 Secondary malignant neoplasm of liver and intrahepatic bile duct: Secondary | ICD-10-CM | POA: Insufficient documentation

## 2021-10-14 MED ORDER — OCTREOTIDE ACETATE 30 MG IM KIT
30.0000 mg | PACK | Freq: Once | INTRAMUSCULAR | Status: AC
Start: 2021-10-14 — End: 2021-10-14
  Administered 2021-10-14: 30 mg via INTRAMUSCULAR
  Filled 2021-10-14: qty 1

## 2021-10-14 NOTE — Interdisciplinary (Signed)
Patient here for SANDOSTATIN injection. A/O x 4, VSS. Denies N/V/D. No c/o pain.  PT ASSESSED BY RN  SANDOSTATIN  injection given to RIGHT GLUT. Bandaid to site. Patient tolerated well.    Ambulated out in stable condition.

## 2021-11-11 ENCOUNTER — Ambulatory Visit
Admission: RE | Admit: 2021-11-11 | Discharge: 2021-11-11 | Disposition: A | Discharge status: Home / Self Care | Payer: TRICARE Prime—HMO | Attending: Hematology & Oncology | Admitting: Hematology & Oncology

## 2021-11-11 VITALS — BP 136/73 | HR 85 | Temp 98.8°F | Resp 17

## 2021-11-11 DIAGNOSIS — E340 Carcinoid syndrome: Secondary | ICD-10-CM

## 2021-11-11 DIAGNOSIS — C254 Malignant neoplasm of endocrine pancreas: Secondary | ICD-10-CM | POA: Insufficient documentation

## 2021-11-11 DIAGNOSIS — C787 Secondary malignant neoplasm of liver and intrahepatic bile duct: Secondary | ICD-10-CM | POA: Insufficient documentation

## 2021-11-11 MED ORDER — OCTREOTIDE ACETATE 30 MG IM KIT
30.0000 mg | PACK | Freq: Once | INTRAMUSCULAR | Status: AC
Start: 2021-11-11 — End: 2021-11-11
  Administered 2021-11-11: 30 mg via INTRAMUSCULAR
  Filled 2021-11-11: qty 1

## 2021-11-11 NOTE — Interdisciplinary (Signed)
Nurses Note: Patient in for Octreotide acetate injection.  Vital signs stable and in no apparent distress.  Pt reports no pain, nausea, vomiting, constipation, or diarrhea.  Pt given ongoing medication teaching.    Sandostatin '30mg'$  given IM in left gluteus.  Patient tolerated injection well and bandaid placed.  Patient discharged to home in no apparent distress.  Return to clinic in 4 week(s) for Sandostatin.    Kathline Magic, RN

## 2021-11-13 ENCOUNTER — Ambulatory Visit (HOSPITAL_BASED_OUTPATIENT_CLINIC_OR_DEPARTMENT_OTHER): Payer: TRICARE Prime—HMO

## 2021-12-09 ENCOUNTER — Ambulatory Visit
Admission: RE | Admit: 2021-12-09 | Discharge: 2021-12-09 | Disposition: A | Discharge status: Home / Self Care | Payer: Medicare Other | Attending: Hematology & Oncology | Admitting: Hematology & Oncology

## 2021-12-09 VITALS — BP 144/85 | HR 87 | Temp 98.1°F | Resp 16 | Ht 65.16 in | Wt 153.9 lb

## 2021-12-09 DIAGNOSIS — C254 Malignant neoplasm of endocrine pancreas: Secondary | ICD-10-CM

## 2021-12-09 DIAGNOSIS — E340 Carcinoid syndrome: Secondary | ICD-10-CM

## 2021-12-09 DIAGNOSIS — C787 Secondary malignant neoplasm of liver and intrahepatic bile duct: Secondary | ICD-10-CM | POA: Insufficient documentation

## 2021-12-09 MED ORDER — OCTREOTIDE ACETATE 30 MG IM KIT
30.0000 mg | PACK | Freq: Once | INTRAMUSCULAR | Status: AC
Start: 2021-12-09 — End: 2021-12-09
  Administered 2021-12-09: 30 mg via INTRAMUSCULAR
  Filled 2021-12-09: qty 1

## 2021-12-09 NOTE — Interdisciplinary (Signed)
Nurses Note: Patient in for Sandostatin injection.  Vital signs stable and in no apparent distress.  Pt reports no nausea, vomiting, constipation, or diarrhea.  Pt given ongoing medication teaching and patient agreeable to treatment today.    Sandosatin given by Samm, LVN.  Patient tolerated injection.  Patient discharged to home in no apparent distress.  Return to clinic in 1 month(s) for Sandostatin injection.    Medications   octreotide (SANDOSTATIN LAR) depot injection 30 mg (30 mg IntraMUSCULAR Given 12/09/21 1030)

## 2021-12-09 NOTE — Interdisciplinary (Signed)
Patient in for Octreotide (Sandostatin).    Vital signs stable and Pt in no apparent distress.    Pt reports no nausea, vomiting, constipation, or diarrhea.    Pt given ongoing medication teaching and advised to seek medical attention if side effect occur.   Pt aware of precautions. Adelina Mings., RN assessed patient.     Octreotide given IM in the RIGHT gluteus and bandage placed.  Patient tolerated injection.       Medications   octreotide (SANDOSTATIN LAR) depot injection 30 mg (30 mg IntraMUSCULAR Given 12/09/21 1030)     Sherrie George, [place]  12/09/21

## 2022-01-06 ENCOUNTER — Ambulatory Visit
Admission: RE | Admit: 2022-01-06 | Discharge: 2022-01-06 | Disposition: A | Discharge status: Home / Self Care | Payer: Medicare Other | Attending: Hematology & Oncology | Admitting: Hematology & Oncology

## 2022-01-06 VITALS — BP 132/79 | HR 68 | Temp 96.5°F | Resp 17 | Ht 65.16 in | Wt 153.9 lb

## 2022-01-06 DIAGNOSIS — C787 Secondary malignant neoplasm of liver and intrahepatic bile duct: Secondary | ICD-10-CM | POA: Insufficient documentation

## 2022-01-06 DIAGNOSIS — E340 Carcinoid syndrome: Secondary | ICD-10-CM | POA: Insufficient documentation

## 2022-01-06 DIAGNOSIS — D3A8 Other benign neuroendocrine tumors: Secondary | ICD-10-CM | POA: Insufficient documentation

## 2022-01-06 DIAGNOSIS — Z09 Encounter for follow-up examination after completed treatment for conditions other than malignant neoplasm: Secondary | ICD-10-CM | POA: Insufficient documentation

## 2022-01-06 DIAGNOSIS — C254 Malignant neoplasm of endocrine pancreas: Secondary | ICD-10-CM | POA: Insufficient documentation

## 2022-01-06 DIAGNOSIS — C7A8 Other malignant neuroendocrine tumors: Secondary | ICD-10-CM | POA: Insufficient documentation

## 2022-01-06 LAB — COMPREHENSIVE METABOLIC PANEL, BLOOD
ALT (SGPT): 38 U/L — ABNORMAL HIGH (ref 0–33)
AST (SGOT): 29 U/L (ref 0–32)
Albumin: 4.2 g/dL (ref 3.5–5.2)
Alkaline Phos: 178 U/L — ABNORMAL HIGH (ref 40–130)
Anion Gap: 9 mmol/L (ref 7–15)
BUN: 13 mg/dL (ref 8–23)
Bicarbonate: 28 mmol/L (ref 22–29)
Bilirubin, Tot: 0.34 mg/dL (ref <1.2)
Calcium: 9 mg/dL (ref 8.5–10.6)
Chloride: 104 mmol/L (ref 98–107)
Creatinine: 0.62 mg/dL (ref 0.51–0.95)
Glucose: 80 mg/dL (ref 70–99)
Potassium: 4.1 mmol/L (ref 3.5–5.1)
Sodium: 141 mmol/L (ref 136–145)
Total Protein: 7.5 g/dL (ref 6.0–8.0)
eGFR Based on CKD-EPI 2021 Equation: >60 mL/min/{1.73_m2}

## 2022-01-06 LAB — CEA TUMOR, BLOOD: CEA Tumor: 10.8 ng/mL

## 2022-01-06 MED ORDER — OCTREOTIDE ACETATE 30 MG IM KIT
30.0000 mg | PACK | Freq: Once | INTRAMUSCULAR | Status: AC
Start: 2022-01-06 — End: 2022-01-06
  Administered 2022-01-06: 30 mg via INTRAMUSCULAR
  Filled 2022-01-06: qty 1

## 2022-01-06 NOTE — Interdisciplinary (Addendum)
Patient arrived ambulatory in stable condition. Denies nausea, vomiting, diarrhea, or constipation.   Denies fevers or recent illness    Butterfly 23Gx3/4" started to: LEFT [facility] for lab draw.                     Positive blood return. #1 attempt.   Gauze/Coban wrap    Labs drawn: 1030    Stephanie, RN completed assissment prior to injection  Gave Sandostatin LAR 30 mg IM Iinjection LEFT gluteus.  Band-aid applied.    Patient tolerated well and discharged in stable condition to lobby awaiting lab results.   Infusion RN to review labs    Pt has appts for next two injections.     Medications   octreotide (SANDOSTATIN LAR) depot injection 30 mg (has no administration in time range)     Hunt Oris, LVN  01/06/22

## 2022-01-09 ENCOUNTER — Telehealth (HOSPITAL_BASED_OUTPATIENT_CLINIC_OR_DEPARTMENT_OTHER): Payer: Self-pay

## 2022-01-09 NOTE — Telephone Encounter (Addendum)
Message from scheduler to call patient about scans.    Chart reviewed. Patient to see Dr. Ane Payment end of August with repeat of scans per progress note. Orders in chart from visit in February.     TC to patient to schedule scans 5-7 days prior to MD visit.    Patient appreciative of call.      Ovid Curd, RN  Nurse Case Manager covering for Baldo Daub, RN  Hester Mates, NP/Gregory Ane Payment, MD  [facility]

## 2022-01-29 ENCOUNTER — Telehealth (HOSPITAL_BASED_OUTPATIENT_CLINIC_OR_DEPARTMENT_OTHER): Payer: Self-pay

## 2022-01-29 NOTE — Telephone Encounter (Signed)
Dr. Wendee Beavers, & Nasra, Counce is scheduled for Sandostatin on 08/15.     Orders are currently unsigned; signed orders are required to avoid cancellation of appointment.   If orders remain unsigned, the patient will be contacted 24 hours prior to their scheduled appointment notifying them of cancellation. The infusion appointment will then be rescheduled after orders are signed.     Patient has a scheduled clinic visit on 08/31.    Thank you,   Delsa Sale, RN Lewanda Rife

## 2022-01-30 ENCOUNTER — Telehealth (HOSPITAL_BASED_OUTPATIENT_CLINIC_OR_DEPARTMENT_OTHER): Payer: Self-pay

## 2022-01-30 NOTE — Telephone Encounter (Signed)
Dr. Ane Payment,     Jenna Bridges is scheduled for Sandostatin on 8/15.    Orders are currently unsigned; signed orders are required to avoid cancellation of appointment.   If orders remain unsigned, the patient will be contacted 24 hours prior to their scheduled appointment notifying them of cancellation. The infusion appointment will then be rescheduled after orders are signed.       Thank you,     Mosie Lukes, RN

## 2022-02-02 ENCOUNTER — Telehealth (HOSPITAL_BASED_OUTPATIENT_CLINIC_OR_DEPARTMENT_OTHER): Payer: Self-pay | Admitting: Hematology & Oncology

## 2022-02-02 NOTE — Telephone Encounter (Signed)
Octreotide

## 2022-02-03 ENCOUNTER — Ambulatory Visit
Admission: RE | Admit: 2022-02-03 | Discharge: 2022-02-03 | Disposition: A | Discharge status: Home / Self Care | Payer: Medicare Other | Attending: Hematology & Oncology | Admitting: Hematology & Oncology

## 2022-02-03 VITALS — BP 143/79 | HR 83 | Temp 98.6°F | Resp 18 | Ht 65.16 in | Wt 153.9 lb

## 2022-02-03 DIAGNOSIS — C787 Secondary malignant neoplasm of liver and intrahepatic bile duct: Secondary | ICD-10-CM | POA: Insufficient documentation

## 2022-02-03 DIAGNOSIS — E340 Carcinoid syndrome: Secondary | ICD-10-CM | POA: Insufficient documentation

## 2022-02-03 DIAGNOSIS — C254 Malignant neoplasm of endocrine pancreas: Secondary | ICD-10-CM | POA: Insufficient documentation

## 2022-02-03 MED ORDER — OCTREOTIDE ACETATE 30 MG IM KIT
30.0000 mg | PACK | Freq: Once | INTRAMUSCULAR | Status: AC
Start: 2022-02-03 — End: 2022-02-03
  Administered 2022-02-03: 30 mg via INTRAMUSCULAR
  Filled 2022-02-03: qty 1

## 2022-02-03 NOTE — Interdisciplinary (Signed)
Jenna Bridges presents for Sandostatin      Pre-treatment nursing assessment:   Patient arrived by ambulating  accompanied by Self   Vital signs are stable, and the patient is in no apparent distress.     02/03/22  1013   BP: 143/79   Pulse: 83   Temp: 98.6 F (37 C)   Resp: 18   SpO2: 99%        Patient denies nausea, vomiting, diarrhea, constipation, or fatigue.      Endorsed care to [place], [place] to administer medication and schedule next appointment. LVN to draw labs per treatment plan if indicated and complete ongoing medication education using approved educational handouts.

## 2022-02-03 NOTE — Interdisciplinary (Signed)
Pre-administration assessment of patient completed by Judson Roch, RN.       30 mg SANDOSTATIN given SQ to right upper quad. gluteus using aseptic technique.        Patient tolerated injection without adverse effects. Site benign. Band-aid placed.      Patient discharged to home in no apparent distress.   Discharge mode: Ambulatory   Return to clinic in 4 week(s) for SANDOSTATIN.   date given and reviewed with treatment plan.      Medications   octreotide (SANDOSTATIN LAR) depot injection 30 mg (30 mg IntraMUSCULAR Given 02/03/22 1044)     Rosine Abe, LVN

## 2022-02-10 ENCOUNTER — Ambulatory Visit
Admission: RE | Admit: 2022-02-10 | Discharge: 2022-02-10 | Disposition: A | Discharge status: Home / Self Care | Payer: Medicare Other | Attending: Hematology & Oncology | Admitting: Hematology & Oncology

## 2022-02-10 DIAGNOSIS — K5000 Crohn's disease of small intestine without complications: Secondary | ICD-10-CM

## 2022-02-10 DIAGNOSIS — K8689 Other specified diseases of pancreas: Secondary | ICD-10-CM

## 2022-02-10 DIAGNOSIS — R918 Other nonspecific abnormal finding of lung field: Secondary | ICD-10-CM

## 2022-02-10 DIAGNOSIS — D3A8 Other benign neuroendocrine tumors: Secondary | ICD-10-CM | POA: Insufficient documentation

## 2022-02-10 MED ORDER — IOHEXOL 350 MG/ML IV SOLN
100.0000 mL | Freq: Once | INTRAVENOUS | Status: AC
Start: 2022-02-10 — End: 2022-02-10
  Administered 2022-02-10: 100 mL via INTRAVENOUS

## 2022-02-18 NOTE — Patient Instructions (Incomplete)
INSTRUCTIONS:    Dr. Art Buff has ordered a PET scan. Please schedule your PET scan by calling [phone]   We will reschedule your injection for 4 weeks after your PET scan  Revisit after PET complete    _____________________________________________________________________    Your Team:      PHYSICIAN: Tressie Ellis, MD PhD      NURSE PRACTITIONER: Davy Pique, NP  Phone: [phone]  Available Monday-Thursday       NURSE CASE MANAGER: Windell Hummingbird RN    Phone: [phone]      I may be unable to answer phone calls/voicemails immediately during clinic days, so if you have an urgent question please call our call center [phone]) and they can page me.  You can also choose to speak to the triage nurse for urgent medical questions by selecting that option when you call me.     ADMINISTRATIVE ASSISTANT: Birdie Hopes (Paperwork, obtaining imaging, scheduling)  Phone: [phone]  Fax: [phone]    PATIENT NAVIGATOR: Alessandra Bevels   Phone: [phone]     *IF you are receiving infusions, please check with the infusion center for a code to enable you to receive free parking on infusion days. Please call [phone] Option 2, or stop at the front desk on your way out. These codes change monthly*                                                                                                                                                 If you have a medical emergency or life threatening situaton, please call 911 or go to the nearest Emergency Room.    AFTER-HOURS CARE:    If you develop a fever of 100.30F or greater after hours (On the weekends and/or Monday through Friday before 8:00am or after 4:30pm) please call [phone] and ask to speak with the On-Call Oncologist. Please also call the On-Call Oncologist for any urgent symptoms that happen after normal business hours.    AFTER HOURS EMERGENCY NUMBER [phone]                       Ask for the Oncology Physician On-Call     The following  link is a video that will help explain choosing a healthcare proxy and the process of advanced care planning.   [email or web address]/program-preview-[email or web address]-speaks-[email or web address]-[email or web address]    [facility] Phone List for Patients     Clinic Hours of Operation: Monday-Friday 8:00- 4:30 p.m. (closed holidays and weekends)   Infusion Center Hours: Monday-Friday 7:00-9:30 p.m.; Sat-Sun 8:00-4:30pm.     Social Worker:   GI cancers: Channing Mutters, LCSW, [phone]  Pancreas cancer: Verdon Cummins, LCSW, [phone]  For emotional, social, spiritual and practical needs that may arise throughout cancer treatment and recovery.    Information Desk/Call Center: [phone]   ** General  information: directions, telephone numbers, or available services     [facility]: [phone], option 2  ** Call to schedule, cancel, or reschedule chemotherapy appointments   [facility] Scheduling: [phone]  [facility] Scheduling: [phone]    Radiation Oncology: [phone]   ** Call to schedule, cancel, or reschedule radiation appointments     Financial Counselor: De Hollingshead [phone]   [email or web address]   [phone] is the centralized billing and insurance information number with a team of knowledgeable representatives ready to help M-F 9am to 6pm.     Registration: [phone] (to update personal information)    [facility]:   Ph:[phone]  [phone]  Open M-F 9-6pm    Radiology Scheduling: [phone]   For PET scans: [phone]    Medical Records: [phone]  Fax: [phone]    Chemotherapy Online Education:  Crisoforo Oxford to recorded Chemotherapy & Immunotherapy Education Class: [email or web address]     Patient and Family Resource Center:  The Patient and Family Resource Center [facility]) offers: The most up-to-date cancer information for patients,  families, and the community. Informative brochures, videos, and books to help you learn about your cancer diagnosis, treatment options, managing symptoms and side effects, clinical trials, nutrition, coping, caregiving, and self-care. Computers with Internet access to the most comprehensive and credible websites for cancer information and community resources. Various comfort items such as lap blankets, pillows, hats, and wigs.  Open daily; hours vary   Location: [facility], Room 1066, on the first floor just to the left of the lobby elevators  Phone [phone]     *If you get a bill from a molecular testing company [facility], [place], [place], [place], [place], [place]) please let us know before you pay. Our staff can help resolve this.

## 2022-02-19 ENCOUNTER — Encounter (HOSPITAL_BASED_OUTPATIENT_CLINIC_OR_DEPARTMENT_OTHER): Payer: Self-pay

## 2022-02-19 ENCOUNTER — Ambulatory Visit: Payer: Medicare Other | Attending: Hematology & Oncology | Admitting: Hematology & Oncology

## 2022-02-19 VITALS — BP 149/82 | HR 96 | Temp 97.0°F | Resp 16 | Ht 65.16 in | Wt 152.0 lb

## 2022-02-19 DIAGNOSIS — C7A8 Other malignant neuroendocrine tumors: Secondary | ICD-10-CM | POA: Insufficient documentation

## 2022-02-19 DIAGNOSIS — Z09 Encounter for follow-up examination after completed treatment for conditions other than malignant neoplasm: Secondary | ICD-10-CM | POA: Insufficient documentation

## 2022-02-19 DIAGNOSIS — C7951 Secondary malignant neoplasm of bone: Secondary | ICD-10-CM | POA: Insufficient documentation

## 2022-02-19 DIAGNOSIS — E340 Carcinoid syndrome: Secondary | ICD-10-CM | POA: Insufficient documentation

## 2022-02-19 NOTE — Progress Notes (Signed)
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 [place] [place] since 08/2017. Previous to this noted watery diarrhea and had increase from 225mto 3044mith good resolution. Has had no abdominal pain or carcinoid symptoms. Recent CT CAP and MRI of pancreas October 2020 found new hepatic lesions and slight interval growth compared with 1 year prior.    She is here to discuss additional treatment options for her and overall prognosis.     05/22/2020: presents alone. [facility] and [facility] says there is progression on her MRI scans but they continued on octreotide 19m24m she was clinically well. Otherwise notes diarrhea if her injection is delayed. [facility] then will need 06/2020 injection from that point forward.     07/18/2020: presents alone. Recent ED visit (1/24) with abdominal pain on right abdomen subcostal, severe when lying down. Restaged at that time showing minor right pleural thickening above right hepatic disease. Scattered 4mm 58mules.No pain has returned, no carcinoid issues.    10/17/2020:  Presents alone. On monthly Sandostatin LAR, tolerating well. Denies flushing, abdominal pain, or diarrhea.    11/07/2020: presents alone today. Restaging scans completed with SD. No diarrhea, flushing, pain.    03/26/2021: presents alone today. Stress about scans but stable since 10/2020. OK with three times a year scans. No issues with carcinoid syndrome. Has RUQ pain stable, believes from  stress. Discussion on injection and depot with previous scar tissue.    05/29/2021: presents today alone.  Has no particular symptoms of carcinoid syndrome.  Her last scans done September 2022 show stable disease without any progression.    02/19/2022:   Denies flushing, diarrhea for the past several years (though these were presenting sx in Fall 2018). Reports concerns with injection of sandostatin - problems with needle breaking and was concerned that she wasn't getting the whole dose because she didn't have an injection site reaction. Also had an episode of significant bruising after last infusion with some continued discomfort to palpation. Last injection 8/15.  Denies fevers, chills, bony pain, abdominal distension, changes in BM, abdominal pain, n/v.     Review of systems: Some anxiety with scans showing SD again today, all other systems reviewed and are negative except for what is described in the history of present illness.    Oncologic Timeline:  09/09/2017-04/06/2019: Octreotide Qmonthly  04/27/2019: [place] [facility]  03/2020: Per report, progression on MRI, continues on octreotide 19mg 39mhly  05/22/2020: F/U Dr. Botta Ane Payment/2022: ED visit with pain, CTA negative  07/18/2020: RV Dr. Botta Ane Payment/222: MRI AP- hepatic lesions similar in size  10/17/2020: RV Dr Botta Ane Payment/2022: RV Dr. Botta Ane Payment/2022: RV  05/26/2021: RV  07/27/2020: RV with new staging  02/19/2022: RV with new staging  -----------------------------  Past Medical History:   Past Medical History:   Diagnosis Date    Liver metastases (CMS-HCC)  05/05/2019       Medications:   Current Outpatient Medications:     COVID-19 mRNA vaccine (PFIZER) 30 MCG/0.3ML injection, , Disp: , Rfl:     octreotide (SANDOSTATIN LAR) 30 MG injection, 30 mg by IntraMUSCULAR route every 28 days., Disp: , Rfl:     Allergies:   No Known Allergies    Social history:   Alcohol: None  Tobacco: None  Drugs: None  Living Situation: Lives with husband, daughter is a PhD  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:  Father: Parkinson/Lewy Body Dementia  Cousins: breast, brain, colon cancer and lymphoma    -----------------------------  Physical exam:  Vitals:BP 152/65 (BP Location: Left arm, BP Patient Position: Sitting, BP cuff size: Regular)   Pulse 50   Temp 98 F (36.7 C) (Temporal)   Resp 17   Ht 5' 5.16" (1.655 m)   Wt 68 kg (150 lb)   SpO2 98%   BMI 24.84 kg/m   Wt Readings from Last 3 Encounters:   05/29/21 68 kg (150 lb)   05/27/21 68.9 kg (151 lb 14.4 oz)   04/29/21 68.3 kg (150 lb 9.2 oz)     PHYSICAL EXAM   (unchanged)  ECOG: 0  GENERAL APPEARANCE: The patient is an alert, cooperative female in no acute distress.  SKIN: No lesions or rashes. No jaundice  EYES: PERRLA, EOMs are intact. No icterus.   EAR, NOSE, AND THROAT: The oropharynx is clear and moist.   LYMPH NODES: No appreciable observable lymphadenopathy  CHEST: The lungs are clear to auscultation. Normal respiratory effort  HEART: RRR S1 and S2 are normal.  There are no murmurs or extra sounds.  ABDOMEN:  The abdomen is soft and nontender. +BS. There is no hepatosplenomegaly. R buttock with some discomfort with palpation but no bruising.   EXTREMITES: There is no edema or cyanosis. Joints are normal without redness or swelling.  NEURO:  Mental status is normal.  No focal neurologic findings.           -----------------------------  Labs: Reviewed by myself  Lab Results   Component Value Date    WBC 8.6 05/29/2021    RBC 4.81 05/29/2021    HGB 14.3 05/29/2021    HCT 42.2 05/29/2021    MCV 87.7 05/29/2021    MCHC 33.9 05/29/2021    RDW 13.0 05/29/2021    PLT 214 05/29/2021    MPV 10.2 05/29/2021     Lab Results   Component Value Date    BUN 13 05/29/2021    CREAT 0.55 05/29/2021    CL 100 05/29/2021    NA 138 05/29/2021    K 4.2 05/29/2021    Hammond 9.6 05/29/2021    TBILI 0.38 05/29/2021    ALB 4.7 05/29/2021    TP 8.1 (H) 05/29/2021    AST 29 05/29/2021    ALK 153 (H) 05/29/2021    BICARB 28 05/29/2021     ALT 41 (H) 05/29/2021    GLU 121 (H) 05/29/2021     Pancreatic Polypeptide 07/2017: 837  Chromogranin A 07/2017: 1  CA19-9 07/2017: 8  Alk Phos 11/2018: 176    Colonoscopy    Radiology: Reviewed by myself in conjunction with radiology report  CT Chest With Contrast    Result Date: 02/10/2022  IMPRESSION: Gradually increasing size of a sclerotic lesion in the T8 vertebral body since 07/15/2020, which is indeterminate, but concerning for osseous metastasis. Stable sub 5 mm pulmonary nodules and  prominent, subcentimeter thoracic lymph nodes, unchanged from 10/30/2020.     CT Abdomen And Pelvis With Contrast    Result Date: 02/10/2022  IMPRESSION: CT scan of the abdomen and pelvis with IV contrast. Findings concerning for worsening disease with enlargement of liver lesions and lymphadenopathy. Similar appearance of pancreatic tail mass. Unchanged findings of terminal ileitis.      03/2020 [facility] Report      04/04/2019 MRI Pancreas    CT Chest 03/27/2019  - No evidence of thoracic metastatic disease    CT A/P 03/24/2019    PET Gallium 68  - Primary pancreatic mass with intense radiotracer uptake and multiple hepatic metastatic lesions    Pathology:    Ki67 3%    Molecular:    PDL1 0%      -----------------------------  Assessment & Plan:  Heydi Swango is a 66 year old female presenting with metastatic PNET with progression on 61m Octreotide Monthly.    We discussed with the patient and their family the natural course of NET. We discussed how many of these come from GI sources including the pancreas, colon, and stomach. Occasionally they are seen from the lung as well. We discussed that they can be indolent and slow growing or quickly capable of rapid growth and spread. Treatment options for symptom relief include somatostatin analogues. Although fewer than 10% of patients see tumor shrinkage with these drugs, prolonged stable disease is seen in 66% which delays progression.     We discussed that if there was interval growth on  the next imaging further options include everolimus, CAPTEM, TKIs (Sunitinib, sorafenib, pazopanib, and cabozantinib) and Lutathera. We will also evaluate the ability to send tissue for NGS today with [employer]    Ultimately we would continue octreotide at this time and add everolimus. We discussed evaluating in 2 months to see if growth stopped or continued. After this we would then recommend either CAPTEM or Lutathera. We discussed the side effects dermatologically of everolimus and instructed the patient to pre-empt the rash with a good face hygiene regimen.     Would have an RV in January to discuss restaging scans.    05/22/2020: We will port [facility] now given 3 month interval. She previously [facility] GA[facility] discussed increasing octreotide or adding regorafenib/everolimus in addition next. We will schedule her for her 06/2020 injection as well to reduce her diarrheal burden. RV monthly until stability. NGS completed as above and added to chart. 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 We discussed MEN can be used as a screening tool for future cancers and she will talk to them but doesn't want to make decision now.    07/18/2020: pain may be referred from hepatic disease, no obvious right effusion needing thoracentesis. Pain has resolved and no diarrheal symptoms. Would recommend quick interval scan in 2 months time, mid March. Continue injections of octreotide.    10/17/2020: No symptoms of carcinoid. Tolerating Sandostatin LAR without issues. Labs today (CBC CMP TSH Vit B12). Can use these labs for CT AP scheduled for 5/11. Continue Sandostatin injections and follow up after imaging.     11/07/2020: doing very well, scans show stability. No nutritional deficiencies seen on labs. Continue with therapy regularly  each month. Restage in 3-6  months.    03/26/2021: continues to do very well. RUQ pain likely related to either liver capsule stretching or possible anxiety. Stable disease on scans without evidence of carcinoid syndrome. Again discussion on MEN1 and screening for pituitary and thyroid disorders and declines. Also discussed genetics counseling and family but declines as well. Will see her in 2 months with scans in 4 months.     05/29/2021:  Continuing to do very well on current dosage and schedule.  We will restage in 6 months' time.    02/19/2022: CT of chest, abdomen, and pelvis significant for interval increase in liver lesions as well as increased lymphadenopathy and increased sclerotic lesion on T8 vertebral body, but similar size of pancreatic tail mass. Discussed next steps including possible treatments such as everolimus, IR ablation, chemotherapy, and lutathera. Plan to continue octreotide and obtain a PET-copper scan to better characterize size and location of lesions. Will determine additional treatment based on results of PET.         ICD-10-CM ICD-9-CM   1. Primary pancreatic neuroendocrine tumor  D3A.8 209.69   2. Post chemo evaluation  [id number]     Therapeutic consent is obtained with all patients prior to the first infusion visit or dose. We discuss alternative treatments, expected side effects, and rare occurrences. Further the patient and I discuss the expected outcomes as well as contact number for anyone on our team.      I spent a total of 30 minutes on the day of the visit.    This patient was seen and discussed with Dr. Ane Payment.    Lourdes Sledge, MD MPH  Internal Medicine PGY-3      -----------------------------  Lab   Orders Placed This Encounter   Procedures    CBC w/ Diff Lavender    CEA Tumor - See Instructions    Comprehensive Metabolic Panel     Imaging   Orders Placed This Encounter   Procedures    CT Chest With Contrast    CT Abdomen And Pelvis With Contrast     Procedures   No orders of  the defined types were placed in this encounter.    Other No orders of the defined types were placed in this encounter.

## 2022-02-19 NOTE — Interdisciplinary (Signed)
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 Ane Payment     Fall Risk  1 or more of the following as reported by patient/caregiver = patient risk for falls: No fall risk identified     Interventions implemented during clinic visit: Oriented to room    Fall Risk Interventions:   No fall risk identified; no interventions at this time     Education provided to patient/caregiver on fall risk factors and precautions.      Nutrition  Malnutrition Screening Tool (MST): 0     Nutrition Interventions:  MST score < 2, referral not indicated, no additional questions or concerns     Education provided on importance of early nutrition intervention for improved symptom management and outcomes, with in-person or telehealth visit and family involvement.     Wellbeing Screening    Screening      No questionnaires available.                        Wellbeing Screening Interventions: No issues identified; no interventions at this time    Patient not on active oral cancer-directed therapy at this time    Pain   Pain Score  Pain Score: 0    Pain interventions:    No intervention needed at this time    Language: [place]           Nursing Education: Educated on plan of care, office contact, and AVS using teach-back method.     Teaching outcomes:      Patient/caregiver verbalized understanding     Patient/caregiver expressed understanding of plan of care and questions answered to patient satisfaction     Education Materials Provided: Additional written education materials not provided for this visit    Plan of care:   PET scan now  Octreotide injection should be 4 weeks from PET scan

## 2022-02-20 NOTE — Interdisciplinary (Signed)
Oncology Social Work Psychosocial Assessment    Date of Service: 02/19/2022    Contacts:   Husband: Gordon Carlson, [phone]    Advance Health Care Directive/Surrogate Decision Maker: Not assessed today.     Service/Provider: GI    Treatment Phase:  Survivorship    Patient Information/Diagnosis: Per chart review, pt is a 66 year old English-speaking female pt with pancreatic neuroendocrine tumor. Please see notes for further details.     Reason for Referral: Assessment of coping and psychosocial needs.     Family Constellation: Married x36 years, two adult children, son and daughter who live local.     Living Situation: Pt lives locally with husband. Husband has some cardiac issues.     Sources of Income/Financial Concerns:  Retired, previously worked as a Radio broadcast assistant. Denies insurance concerns, states this was sorted out. Not concerened re: copays or out-of-pockets. Denies other financial concerns.     Relevant Mental Health History: Pt denies mental health history. She denies significant symptoms of anxiety or depression. She reports stable mood and coping. Pt does spend time processing various stressors, including diagnosis, husband's recent heart attack, deaths in the family, and independent young adult children who do not come around much. Of note, pt reports some sadness around her family/friends no longer inviting her places due to diagnosis and their perception of her functional ability. Despite stressors, pt feels she is coping well and declines additional mental health support today.     Current Ability to Cope/Interests/What Gives Life Meaning: Pt reports getting strength from husband, children, gardening. She reports strong faith base as well, which gives her peace.     Understanding of Illness/Treatment Plan: Pt shares understanding that she will have further work-up scans, may switch to oral medication for management. She is amendable to this plan.     Hopes/Goals: Pt shares concerns re: increased  symptoms, she is hopeful for symptoms management, does not want to experience significant pain. She additionally shares hope that she can live longer to support her husband, she expresses concern that her husband will not have anyone to help care for him if she passes away first.     Current QOL/Functional Ability/ADLs/DME: Currently active, independent, no DME.    Barriers to Treatment: None identified today.     Assessment: 66 year old English-speaking female pt seen in clinic with Dr. Ane Payment today. Pt is amendable to visit with LCSW today and easily engaged. She appears to have good understanding of her health status and is in agreement with plan. She appears to have limited support in place and seems to be coping WNL. She is receptive to additional support as needed and agrees to reach out for this.     Intervention: LCSW allowed space for pt to share her thoughts. Provided validation and supportive counseling. Reviewed available support available. Invited her to reach out for support as needed.     Referrals: None today.     Plan: Social work team remains available.     [place], [place]

## 2022-02-24 ENCOUNTER — Ambulatory Visit
Admission: RE | Admit: 2022-02-24 | Discharge: 2022-02-24 | Disposition: A | Discharge status: Home / Self Care | Payer: Medicare Other | Attending: Nuclear Medicine | Admitting: Nuclear Medicine

## 2022-02-24 ENCOUNTER — Ambulatory Visit (HOSPITAL_BASED_OUTPATIENT_CLINIC_OR_DEPARTMENT_OTHER)
Admit: 2022-02-24 | Discharge: 2022-02-24 | Disposition: A | Discharge status: Home Health | Payer: Medicare Other | Attending: Hematology & Oncology | Admitting: Hematology & Oncology

## 2022-02-24 DIAGNOSIS — C787 Secondary malignant neoplasm of liver and intrahepatic bile duct: Secondary | ICD-10-CM

## 2022-02-24 DIAGNOSIS — C7A8 Other malignant neuroendocrine tumors: Secondary | ICD-10-CM | POA: Insufficient documentation

## 2022-02-24 DIAGNOSIS — C7951 Secondary malignant neoplasm of bone: Secondary | ICD-10-CM

## 2022-02-24 MED ORDER — COPPER CU 64 DOTATATE 1 MCI/ML IV SOLN
3.9400 | Freq: Once | INTRAVENOUS | Status: AC
Start: 2022-02-24 — End: 2022-02-24
  Administered 2022-02-24: 3.94 via INTRAVENOUS
  Filled 2022-02-24: qty 4

## 2022-03-03 ENCOUNTER — Ambulatory Visit
Admission: RE | Admit: 2022-03-03 | Discharge: 2022-03-03 | Disposition: A | Discharge status: Home / Self Care | Payer: Medicare Other | Attending: Hematology & Oncology | Admitting: Hematology & Oncology

## 2022-03-03 VITALS — BP 138/77 | HR 88 | Temp 98.2°F | Resp 17 | Ht 65.16 in | Wt 151.9 lb

## 2022-03-03 DIAGNOSIS — C254 Malignant neoplasm of endocrine pancreas: Secondary | ICD-10-CM | POA: Insufficient documentation

## 2022-03-03 DIAGNOSIS — C7A8 Other malignant neuroendocrine tumors: Secondary | ICD-10-CM | POA: Insufficient documentation

## 2022-03-03 DIAGNOSIS — C787 Secondary malignant neoplasm of liver and intrahepatic bile duct: Secondary | ICD-10-CM | POA: Insufficient documentation

## 2022-03-03 DIAGNOSIS — E340 Carcinoid syndrome: Secondary | ICD-10-CM | POA: Insufficient documentation

## 2022-03-03 DIAGNOSIS — Z09 Encounter for follow-up examination after completed treatment for conditions other than malignant neoplasm: Secondary | ICD-10-CM | POA: Insufficient documentation

## 2022-03-03 LAB — COMPREHENSIVE METABOLIC PANEL, BLOOD
ALT (SGPT): 41 U/L — ABNORMAL HIGH (ref 0–33)
AST (SGOT): 22 U/L (ref 0–32)
Albumin: 4.2 g/dL (ref 3.5–5.2)
Alkaline Phos: 213 U/L — ABNORMAL HIGH (ref 40–130)
Anion Gap: 10 mmol/L (ref 7–15)
BUN: 15 mg/dL (ref 8–23)
Bicarbonate: 29 mmol/L (ref 22–29)
Bilirubin, Tot: 0.29 mg/dL (ref <1.2)
Calcium: 9.3 mg/dL (ref 8.5–10.6)
Chloride: 103 mmol/L (ref 98–107)
Creatinine: 0.62 mg/dL (ref 0.51–0.95)
Glucose: 100 mg/dL — ABNORMAL HIGH (ref 70–99)
Potassium: 4 mmol/L (ref 3.5–5.1)
Sodium: 142 mmol/L (ref 136–145)
Total Protein: 7.9 g/dL (ref 6.0–8.0)
eGFR Based on CKD-EPI 2021 Equation: >60 mL/min/{1.73_m2}

## 2022-03-03 LAB — CBC WITH DIFF, BLOOD
ANC-Automated: 3.8 10*3/uL (ref 1.6–7.0)
ANC-Instrument: 3.8 10*3/uL (ref 1.6–7.0)
Abs Basophils: 0 10*3/uL (ref <0.2)
Abs Eosinophils: 0.1 10*3/uL (ref 0.0–0.5)
Abs Lymphs: 1.5 10*3/uL (ref 0.8–3.1)
Abs Monos: 0.5 10*3/uL (ref 0.2–0.8)
Basophils: 0 %
Eosinophils: 2 %
Hct: 39 % (ref 34.0–45.0)
Hgb: 13 gm/dL (ref 11.2–15.7)
Lymphocytes: 25 %
MCH: 29.8 pg (ref 26.0–32.0)
MCHC: 33.3 g/dL (ref 32.0–36.0)
MCV: 89.4 um3 (ref 79.0–95.0)
MPV: 10 fL (ref 9.4–12.4)
Monocytes: 9 %
Plt Count: 243 10*3/uL (ref 140–370)
RBC: 4.36 10*6/uL (ref 3.90–5.20)
RDW: 12.5 % (ref 12.0–14.0)
Segs: 64 %
WBC: 5.9 10*3/uL (ref 4.0–10.0)

## 2022-03-03 LAB — CEA TUMOR, BLOOD: CEA Tumor: 12.5 ng/mL

## 2022-03-03 MED ORDER — OCTREOTIDE ACETATE 30 MG IM KIT
30.0000 mg | PACK | Freq: Once | INTRAMUSCULAR | Status: AC
Start: 2022-03-03 — End: 2022-03-03
  Administered 2022-03-03: 30 mg via INTRAMUSCULAR
  Filled 2022-03-03: qty 1

## 2022-03-03 NOTE — Interdisciplinary (Signed)
Randal Buba presents for octreotide (Sandostatin LAR).  She arrived ambulatory and unaccompanied.     Pre-treatment nursing assessment:   Vital signs are stable, and the patient is in no apparent distress.     03/03/22  1021   BP: 138/77   Pulse: 88   Temp: 98.2 F (36.8 C)   Resp: 17   SpO2: 98%     Patient denies nausea, vomiting, diarrhea, constipation, or fatigue.  Holy states that she's had "probably 37 of these injections" and only experienced a problem with her last injection.  She developed a large "welt" that "turned purple" at the injection site.  RN encouraged LVN to administer the medication slowly.  Iyana denied questions or concerns regarding today's treatment.     Endorsed care to [place], [place] to administer medication and schedule next appointment. LVN to draw labs per treatment plan if indicated and complete ongoing medication education using approved educational handouts.

## 2022-03-03 NOTE — Interdisciplinary (Signed)
Pre-administration assessment of patient completed by [facility], RN.       81m SANDOSTATIN given IM to left upper quad. gluteus using aseptic technique.        Patient tolerated injection without adverse effects. Site benign. Band-aid placed.      Patient discharged to home in no apparent distress.   Discharge mode: Ambulatory   Return to clinic in 4 week(s) for SANDOSTATIN.   request submitted.    Medications   octreotide (SANDOSTATIN LAR) depot injection 30 mg (30 mg IntraMUSCULAR Given 03/03/22 1036)       JRosine Abe LVN

## 2022-03-19 ENCOUNTER — Ambulatory Visit: Payer: Medicare Other | Attending: Hematology & Oncology | Admitting: Hematology & Oncology

## 2022-03-19 VITALS — BP 141/81 | HR 81 | Temp 98.0°F | Resp 18 | Wt 153.0 lb

## 2022-03-19 DIAGNOSIS — C7951 Secondary malignant neoplasm of bone: Secondary | ICD-10-CM

## 2022-03-19 DIAGNOSIS — Z79899 Other long term (current) drug therapy: Secondary | ICD-10-CM

## 2022-03-19 DIAGNOSIS — C7A8 Other malignant neuroendocrine tumors: Secondary | ICD-10-CM | POA: Insufficient documentation

## 2022-03-19 DIAGNOSIS — E340 Carcinoid syndrome: Secondary | ICD-10-CM | POA: Insufficient documentation

## 2022-03-19 NOTE — Patient Instructions (Signed)
INSTRUCTIONS:        _____________________________________________________________________    Your Team:      PHYSICIAN: Margreta Journey, MD PhD      NURSE PRACTITIONER: Hester Mates, NP       NURSE CASE MANAGER: Baldo Daub RN    Phone: [phone]      I may be unable to answer phone calls/voicemails immediately during clinic days, so if you have an urgent question please call our call center [phone]) and they can page me.  You can also choose to speak to the triage nurse for urgent medical questions by selecting that option when you call me.     ADMINISTRATIVE ASSISTANT: Graylon Good (Paperwork, obtaining imaging, scheduling)  Phone: [phone]  Fax: [phone]    PATIENT NAVIGATOR: Dellie Burns   Phone: [phone]     *IF you are receiving infusions, please check with the infusion center for a code to enable you to receive free parking on infusion days. Please call [phone] Option 2, or stop at the front desk on your way out. These codes change monthly*                                                                                                                                                 If you have a medical emergency or life threatening situaton, please call 911 or go to the nearest Emergency Room.    AFTER-HOURS CARE:    If you develop a fever of 100.66F or greater after hours (On the weekends and/or Monday through Friday before 8:00am or after 4:30pm) please call [phone] and ask to speak with the On-Call Oncologist. Please also call the On-Call Oncologist for any urgent symptoms that happen after normal business hours.    AFTER HOURS EMERGENCY NUMBER [phone]                       Ask for the Oncology Physician On-Call     The following link is a video that will help explain choosing a healthcare proxy and the process of advanced care planning.   [email or web address].com/program-preview-who-speaks-[email or web address]-you/    [facility] Phone List for Patients      Clinic Hours of Operation: Monday-Friday 8:00- 4:30 p.m. (closed holidays and weekends)   Infusion Center Hours: Monday-Friday 7:00-9:30 p.m.; Sat-Sun 8:00-4:30pm.     Social Worker:   GI cancers: Aris [place], [place], [phone]  Pancreas cancer: Lina Sar, LCSW, [phone]  For emotional, social, spiritual and practical needs that may arise throughout cancer treatment and recovery.    Information Desk/Call Center: [phone]   ** General information: directions, telephone numbers, or available services     [place]: [phone], option 2  ** Call to schedule, cancel, or reschedule chemotherapy appointments   [place] Scheduling: [phone]  [facility] Scheduling: [phone]    Radiation Oncology: [phone]   ** Call to schedule, cancel, or reschedule radiation appointments     Financial Counselor: Esperanza Richters [phone]   [email or web address]   [phone] is the centralized billing and insurance information number with a team of knowledgeable representatives ready to help M-F 9am to 6pm.     Registration: [id number] (to update personal information)    [place]:   [phone]  Open M-F 9-6pm    Radiology Scheduling: [phone]   For PET scans: [phone]    Medical Records: [phone]  Fax: [phone]    Chemotherapy Online Education:  Norm Parcel to recorded Chemotherapy & Immunotherapy Education Class: [email or web address].hunt.info/     Patient and [place]:  The Patient and [facility]) offers: The most up-to-date cancer information for patients, families, and the community. Informative brochures, videos, and books to help you learn about your cancer diagnosis, treatment options, managing symptoms and side effects, clinical trials, nutrition, coping, caregiving, and  self-care. Computers with Internet access to the most comprehensive and credible websites for cancer information and community resources. Various comfort items such as lap blankets, pillows, hats, and wigs.  Open daily; hours vary   Location: [facility], Room 1066, on the first floor just to the left of the lobby elevators  Phone [phone]     *If you get a bill from a molecular testing company ([place], [place], [place], [place], [place]) please let us know before you pay. Our staff can help resolve this.

## 2022-03-19 NOTE — Progress Notes (Signed)
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 since 08/2017. Previous to this noted watery diarrhea and had increase from 233mto 3033mith good resolution. Has had no abdominal pain or carcinoid symptoms. Recent CT CAP and MRI of pancreas October 2020 found new hepatic lesions and slight interval growth compared with 1 year prior.    She is here to discuss additional treatment options for her and overall prognosis.     05/22/2020: presents alone. [facility] and [facility] says there is progression on her MRI scans but they continued on octreotide 49m9m she was clinically well. Otherwise notes diarrhea if her injection is delayed. [facility] then will need 06/2020 injection from that point forward.     07/18/2020: presents alone. Recent ED visit (1/24) with abdominal pain on right abdomen subcostal, severe when lying down. Restaged at that time showing minor right pleural thickening above right hepatic disease. Scattered 4mm 9mules.No pain has returned, no carcinoid issues.    10/17/2020:  Presents alone. On monthly Sandostatin LAR, tolerating well. Denies flushing, abdominal pain, or diarrhea.    11/07/2020: presents alone today. Restaging scans completed with SD. No diarrhea, flushing, pain.    03/26/2021: presents alone today. Stress about scans but stable since 10/2020. OK with three times a year scans. No issues with carcinoid syndrome. Has RUQ pain stable, believes from  stress. Discussion on injection and depot with previous scar tissue.    05/29/2021: presents today alone.  Has no particular symptoms of carcinoid syndrome.  Her last scans done September 2022 show stable disease without any progression.    02/19/2022:   Denies flushing, diarrhea for the past several years (though these were presenting sx in Fall 2018). Reports concerns with injection of sandostatin - problems with needle breaking and was concerned that she wasn't getting the whole dose because she didn't have an injection site reaction. Also had an episode of significant bruising after last infusion with some continued discomfort to palpation. Last injection 8/15.  Denies fevers, chills, bony pain, abdominal distension, changes in BM, abdominal pain, n/v.     03/19/2022: presents today alone. She had PET Cu64 complete with disease in liver and pancreatic tail with T8 lesion. She has no symptoms and no back pain. A lot of family issues with husband celiac, daughter crohns, daughter in law with cervical dystonia. Would like to know options for treatment. Without diarrhea, flushing, pain.       Review of systems: Some anxiety with scans showing SD again today and very talkative, all other systems reviewed and are negative except for what is described in the history of present illness.    Oncologic Timeline:  09/09/2017-04/06/2019: Octreotide Qmonthly  [facility]  03/2020: Per report, progression on MRI, continues on octreotide 49mg 66mhly  05/22/2020: F/U Dr. Mostyn Varnell Ane Payment/2022: ED visit with pain, CTA  negative  07/18/2020: RV Dr. Ane Payment  2/20/222: MRI AP- hepatic lesions similar in size  10/17/2020: RV Dr Ane Payment  11/07/2020: RV Dr. Ane Payment  03/26/2021: RV  05/26/2021: RV  07/27/2020: RV with new staging  02/19/2022: RV with new staging  03/19/2022: plan to move to lutathera    -----------------------------  Past Medical History:   Past Medical History:   Diagnosis Date    Liver metastases (CMS-HCC)  05/05/2019       Medications:   Current Outpatient Medications:     COVID-19 mRNA vaccine (PFIZER) 30 MCG/0.3ML injection, , Disp: , Rfl:     octreotide (SANDOSTATIN LAR) 30 MG injection, 30 mg by IntraMUSCULAR route every 28 days., Disp: , Rfl:     Allergies:   No Known Allergies    Social history:   Alcohol: None  Tobacco: None  Drugs: None  Living Situation: Lives with husband, daughter is a PhD Personal assistant at [employer] history:  Father: Parkinson/Lewy Body Dementia  Cousins: breast, brain, colon cancer and lymphoma    -----------------------------  Physical exam:  Vitals:BP 141/81 (BP Location: Right arm, BP Patient Position: Sitting, BP cuff size: Regular)   Pulse 81   Temp 98 F (36.7 C)   Resp 18   Wt 69.4 kg (153 lb)   SpO2 99%   BMI 25.34 kg/m   Wt Readings from Last 3 Encounters:   03/19/22 69.4 kg (153 lb)   03/03/22 68.9 kg (151 lb 14.4 oz)   02/19/22 68.9 kg (152 lb)     PHYSICAL EXAM   (unchanged)  ECOG: 0  GENERAL APPEARANCE: The patient is an alert, cooperative female in no acute distress.  SKIN: No lesions or rashes. No jaundice  EYES: PERRLA, EOMs are intact. No icterus.   EAR, NOSE, AND THROAT: The oropharynx is clear and moist.   LYMPH NODES: No appreciable observable lymphadenopathy  CHEST: The lungs are clear to auscultation. Normal respiratory effort  HEART: RRR S1 and S2 are normal.  There are no murmurs or extra sounds.  ABDOMEN:  The abdomen is soft and nontender. +BS. There is no hepatosplenomegaly. R buttock with some discomfort with palpation but no bruising.   EXTREMITES: There is no edema or cyanosis. Joints are normal without redness or swelling.  NEURO:  Mental status is normal.  No focal neurologic findings.           -----------------------------  Labs: Reviewed by myself  Lab Results   Component Value Date    WBC 5.9 03/03/2022    RBC 4.36 03/03/2022    HGB 13.0 03/03/2022    HCT 39.0 03/03/2022    MCV 89.4 03/03/2022    MCHC 33.3 03/03/2022    RDW 12.5  03/03/2022    PLT 243 03/03/2022    MPV 10.0 03/03/2022     Lab Results   Component Value Date    BUN 15 03/03/2022    CREAT 0.62 03/03/2022    CL 103 03/03/2022    NA 142 03/03/2022    K 4.0 03/03/2022    [place] 9.3 03/03/2022    TBILI 0.29 03/03/2022    ALB 4.2 03/03/2022    TP 7.9 03/03/2022    AST 22 03/03/2022    ALK 213 (H) 03/03/2022    BICARB 29 03/03/2022    ALT 41 (H) 03/03/2022    GLU 100 (H) 03/03/2022     Pancreatic Polypeptide 07/2017: 837  Chromogranin A 07/2017: 1  CA19-9 07/2017: 8  Alk Phos 11/2018: 176    Colonoscopy    Radiology: Reviewed by myself in conjunction with radiology report  PET/CT Cu64 Dotatate Skull To Mid Thigh (Non Diag CT For [facility])    Result Date: 02/24/2022  IMPRESSION: Abnormal copper-64 DOTATATE PET/CT study demonstrating somatostatin receptor positive tumor involving pancreatic tail mass, somatostatin receptor positive peripancreatic and periportal nodal metastases, small soft tissue metastasis adjacent to right 12th rib, extensive hepatic metastases, and osseous metastasis involving T8.     CT Chest With Contrast    Result Date: 02/10/2022  IMPRESSION: Gradually increasing size of a sclerotic lesion in the T8 vertebral body since 07/15/2020, which is indeterminate, but concerning for osseous metastasis. Stable sub 5 mm pulmonary nodules and prominent, subcentimeter thoracic lymph nodes, unchanged from 10/30/2020.     CT Abdomen And Pelvis With Contrast    Result Date: 02/10/2022  IMPRESSION: CT scan of the abdomen and pelvis with IV contrast. Findings concerning for worsening disease with enlargement of liver lesions and lymphadenopathy. Similar appearance of pancreatic tail mass. Unchanged findings of terminal ileitis.      03/2020 [facility] Report      04/04/2019 MRI Pancreas    CT Chest 03/27/2019  - No evidence of thoracic metastatic disease    CT A/P 03/24/2019    PET Gall[place] 68  - Primary pancreatic mass with intense radiotracer uptake and multiple hepatic metastatic  lesions    Pathology:    Ki67 3%    Molecular:    PDL1 0%      -----------------------------  Assessment & Plan:  Jenna Bridges is a 66 year old female presenting with metastatic PNET with progression on 84m Octreotide Monthly.    We discussed with the patient and their family the natural course of NET. We discussed how many of these come from GI sources including the pancreas, colon, and stomach. Occasionally they are seen from the lung as well. We discussed that they can be indolent and slow growing or quickly capable of rapid growth and spread. Treatment options for symptom relief include somatostatin analogues. Although fewer than 10% of patients see tumor shrinkage with these drugs, prolonged stable disease is seen in 66% which delays progression.     We discussed that if there was interval growth on the next imaging further options include everolimus, CAPTEM, TKIs (Sunitinib, sorafenib, pazopanib, and cabozantinib) and Lutathera. We will also evaluate the ability to send tissue for NGS today with [employer]    Ultimately we would continue octreotide at this time and add everolimus. We discussed evaluating in 2 months to see if growth stopped or continued. After this we would then recommend either CAPTEM or Lutathera. We discussed the side effects dermatologically of everolimus and instructed the patient to pre-empt the rash with a good face hygiene regimen.     Would have an RV in January to discuss restaging scans.    05/22/2020: We will port her scans in [facility] [employer] now given 3 month interval. She previously had a PET GA68 to follow as well for [place] We discussed increasing octreotide or adding regorafenib/everolimus in addition next. We will schedule her for her 06/2020 injection as well to reduce her diarrheal burden. RV monthly until stability. NGS completed as above and added to chart. With MEN1 close to 50% we discussed germline testing however the patient wished not to pursue as her children are  grown and per her report not having children or already have had children. We discussed MEN can  be used as a screening tool for future cancers and she will talk to them but doesn't want to make decision now.    07/18/2020: pain may be referred from hepatic disease, no obvious right effusion needing thoracentesis. Pain has resolved and no diarrheal symptoms. Would recommend quick interval scan in 2 months time, mid March. Continue injections of octreotide.    10/17/2020: No symptoms of carcinoid. Tolerating Sandostatin LAR without issues. Labs today (CBC CMP TSH Vit B12). Can use these labs for CT AP scheduled for 5/11. Continue Sandostatin injections and follow up after imaging.     11/07/2020: doing very well, scans show stability. No nutritional deficiencies seen on labs. Continue with therapy regularly each month. Restage in 3-6 months.    03/26/2021: continues to do very well. RUQ pain likely related to either liver capsule stretching or possible anxiety. Stable disease on scans without evidence of carcinoid syndrome. Again discussion on MEN1 and screening for pituitary and thyroid disorders and declines. Also discussed genetics counseling and family but declines as well. Will see her in 2 months with scans in 4 months.     05/29/2021:  Continuing to do very well on current dosage and schedule.  We will restage in 6 months' time.    02/19/2022: CT of chest, abdomen, and pelvis significant for interval increase in liver lesions as well as increased lymphadenopathy and increased sclerotic lesion on T8 vertebral body, but similar size of pancreatic tail mass. Discussed next steps including possible treatments such as everolimus, IR ablation, chemotherapy, and lutathera. Plan to continue octreotide and obtain a PET-copper scan to better characterize size and location of lesions. Will determine additional treatment based on results of PET.     03/19/2022: at this time with PET Cu64 showing diffuse and progressive disease  discussed multiple options for improved control. Discussed biopsy of newly growing area but patient declines at this time. Discussed moving to lanreotide but concern for definitive control, more likely slow growth over time. Does not wish to take a daily pill or oral chemotherapy. As such Lutathera may be a more appropriate route. It does not disrupt her day to day and allows her to continue with a more effective, targeted approach that appeals to her. Will have the Nuclear Medicine team reach out to her to schedule and work with them to align our octreotide schedule with her treatment.       No diagnosis found.    Therapeutic consent is obtained with all patients prior to the first infusion visit or dose. We discuss alternative treatments, expected side effects, and rare occurrences. Further the patient and I discuss the expected outcomes as well as contact number for anyone on our team.      No LOS data to display    Margreta Journey MD/PhD      -----------------------------  Lab   No orders of the defined types were placed in this encounter.    Imaging   No orders of the defined types were placed in this encounter.    Procedures   No orders of the defined types were placed in this encounter.    Other No orders of the defined types were placed in this encounter.

## 2022-03-20 ENCOUNTER — Other Ambulatory Visit (HOSPITAL_BASED_OUTPATIENT_CLINIC_OR_DEPARTMENT_OTHER): Payer: Self-pay | Admitting: Hematology & Oncology

## 2022-03-20 DIAGNOSIS — D3A8 Other benign neuroendocrine tumors: Secondary | ICD-10-CM

## 2022-03-25 ENCOUNTER — Encounter (HOSPITAL_BASED_OUTPATIENT_CLINIC_OR_DEPARTMENT_OTHER): Payer: Self-pay | Admitting: Hematology & Oncology

## 2022-03-25 DIAGNOSIS — D3A8 Other benign neuroendocrine tumors: Secondary | ICD-10-CM

## 2022-03-31 ENCOUNTER — Ambulatory Visit
Admission: RE | Admit: 2022-03-31 | Discharge: 2022-03-31 | Disposition: A | Discharge status: Home / Self Care | Payer: Medicare Other | Attending: Hematology & Oncology | Admitting: Hematology & Oncology

## 2022-03-31 VITALS — BP 140/85 | HR 82 | Temp 98.4°F | Resp 18

## 2022-03-31 DIAGNOSIS — E340 Carcinoid syndrome: Secondary | ICD-10-CM | POA: Insufficient documentation

## 2022-03-31 DIAGNOSIS — C787 Secondary malignant neoplasm of liver and intrahepatic bile duct: Secondary | ICD-10-CM | POA: Insufficient documentation

## 2022-03-31 DIAGNOSIS — C254 Malignant neoplasm of endocrine pancreas: Secondary | ICD-10-CM | POA: Insufficient documentation

## 2022-03-31 MED ORDER — OCTREOTIDE ACETATE 30 MG IM KIT
30.0000 mg | PACK | Freq: Once | INTRAMUSCULAR | Status: AC
Start: 2022-03-31 — End: 2022-03-31
  Administered 2022-03-31: 30 mg via INTRAMUSCULAR
  Filled 2022-03-31: qty 1

## 2022-03-31 NOTE — Interdisciplinary (Signed)
Nurses Note: Patient in for Sandostatin injection.  Vital signs stable and in no apparent distress.  Pt reports no nausea, vomiting, constipation, or diarrhea.  Pt given ongoing medication teaching.    Sandostatin given right buttocks IM.  Patient tolerated injection.  Patient discharged to home in no apparent distress.  Patient will follow-up with MD prior to scheduling next appt.

## 2022-04-02 ENCOUNTER — Ambulatory Visit: Admit: 2022-04-02 | Discharge: 2022-04-02 | Disposition: A | Discharge status: Home Health | Payer: Medicare Other

## 2022-04-02 ENCOUNTER — Ambulatory Visit
Admission: RE | Admit: 2022-04-02 | Discharge: 2022-04-02 | Disposition: A | Discharge status: Home / Self Care | Payer: Medicare Other | Attending: Hematology & Oncology | Admitting: Hematology & Oncology

## 2022-04-02 DIAGNOSIS — D3A8 Other benign neuroendocrine tumors: Secondary | ICD-10-CM | POA: Insufficient documentation

## 2022-04-30 ENCOUNTER — Telehealth (HOSPITAL_BASED_OUTPATIENT_CLINIC_OR_DEPARTMENT_OTHER): Payer: Self-pay

## 2022-04-30 NOTE — Telephone Encounter (Signed)
Dr. Ane Payment, NP Mikle Bosworth, and RN Pappalardo,     Jenna Bridges is scheduled for infusion of New Lutathera on 11/14.     Orders are currently unsigned; signed orders are required to avoid cancellation of appointment.   If orders remain unsigned, the patient will be contacted 24 hours prior to their scheduled appointment notifying them of cancellation. The infusion appointment will then be rescheduled after orders are signed.    Currently there is no consent on file. A fully executed consent is required before proceeding with this appointment. Please provide plan to obtain fully executed consent to avoid cancellation of this infusion appointment.     Patient does not have a scheduled clinic visit.    Thank you,   Delsa Sale, RN Lewanda Rife

## 2022-05-03 ENCOUNTER — Ambulatory Visit
Admission: RE | Admit: 2022-05-03 | Discharge: 2022-05-03 | Disposition: A | Discharge status: Home / Self Care | Payer: Medicare Other | Attending: Hematology & Oncology | Admitting: Hematology & Oncology

## 2022-05-03 DIAGNOSIS — D3A8 Other benign neuroendocrine tumors: Secondary | ICD-10-CM | POA: Insufficient documentation

## 2022-05-03 LAB — CBC WITH DIFF, BLOOD
ANC-Automated: 3.7 10*3/uL (ref 1.6–7.0)
Abs Basophils: 0 10*3/uL (ref <0.2)
Abs Eosinophils: 0.1 10*3/uL (ref 0.0–0.5)
Abs Lymphs: 1.4 10*3/uL (ref 0.8–3.1)
Abs Monos: 0.5 10*3/uL (ref 0.2–0.8)
Basophils: 1 %
Eosinophils: 2 %
Hct: 41.3 % (ref 34.0–45.0)
Hgb: 13.8 gm/dL (ref 11.2–15.7)
Lymphocytes: 24 %
MCH: 29.8 pg (ref 26.0–32.0)
MCHC: 33.4 g/dL (ref 32.0–36.0)
MCV: 89.2 um3 (ref 79.0–95.0)
MPV: 9.7 fL (ref 9.4–12.4)
Monocytes: 9 %
Plt Count: 251 10*3/uL (ref 140–370)
RBC: 4.63 10*6/uL (ref 3.90–5.20)
RDW: 13.1 % (ref 12.0–14.0)
Segs: 65 %
WBC: 5.7 10*3/uL (ref 4.0–10.0)

## 2022-05-03 LAB — COMPREHENSIVE METABOLIC PANEL, BLOOD
ALT (SGPT): 36 U/L — ABNORMAL HIGH (ref 0–33)
AST (SGOT): 24 U/L (ref 0–32)
Albumin: 4.4 g/dL (ref 3.5–5.2)
Alkaline Phos: 212 U/L — ABNORMAL HIGH (ref 40–130)
Anion Gap: 9 mmol/L (ref 7–15)
BUN: 13 mg/dL (ref 8–23)
Bicarbonate: 29 mmol/L (ref 22–29)
Bilirubin, Tot: 0.33 mg/dL (ref <1.2)
Calcium: 9 mg/dL (ref 8.5–10.6)
Chloride: 103 mmol/L (ref 98–107)
Creatinine: 0.64 mg/dL (ref 0.51–0.95)
Glucose: 95 mg/dL (ref 70–99)
Potassium: 4.1 mmol/L (ref 3.5–5.1)
Sodium: 141 mmol/L (ref 136–145)
Total Protein: 7.9 g/dL (ref 6.0–8.0)
eGFR Based on CKD-EPI 2021 Equation: >60 mL/min/{1.73_m2}

## 2022-05-03 NOTE — Interdisciplinary (Signed)
Patient arrived ambulatory in stable condition. Denies nausea, vomiting, diarrhea, or constipation.   Denies fevers or recent illness       Labs drawn: 0910    Patient discharged ambulatory in stable condition to lobby awaiting lab results.   Charge Triage RN to review labs

## 2022-05-04 ENCOUNTER — Telehealth (HOSPITAL_BASED_OUTPATIENT_CLINIC_OR_DEPARTMENT_OTHER): Payer: Self-pay | Admitting: Hematology & Oncology

## 2022-05-04 DIAGNOSIS — C254 Malignant neoplasm of endocrine pancreas: Secondary | ICD-10-CM

## 2022-05-04 DIAGNOSIS — Z5181 Encounter for therapeutic drug level monitoring: Secondary | ICD-10-CM

## 2022-05-04 NOTE — Telephone Encounter (Signed)
Lutathera signed, needs echo STAT

## 2022-05-05 ENCOUNTER — Ambulatory Visit
Admission: RE | Admit: 2022-05-05 | Discharge: 2022-05-05 | Disposition: A | Discharge status: Home / Self Care | Payer: Medicare Other | Attending: Nuclear Medicine | Admitting: Nuclear Medicine

## 2022-05-05 ENCOUNTER — Ambulatory Visit
Admission: RE | Admit: 2022-05-05 | Discharge: 2022-05-05 | Disposition: A | Discharge status: Home / Self Care | Payer: Medicare Other | Attending: Hematology & Oncology | Admitting: Hematology & Oncology

## 2022-05-05 ENCOUNTER — Ambulatory Visit (HOSPITAL_BASED_OUTPATIENT_CLINIC_OR_DEPARTMENT_OTHER)
Admission: RE | Admit: 2022-05-05 | Discharge: 2022-05-05 | Disposition: A | Discharge status: Home Health | Payer: Medicare Other | Attending: Hematology & Oncology | Admitting: Hematology & Oncology

## 2022-05-05 ENCOUNTER — Telehealth (HOSPITAL_BASED_OUTPATIENT_CLINIC_OR_DEPARTMENT_OTHER): Payer: Self-pay | Admitting: Hematology & Oncology

## 2022-05-05 VITALS — BP 101/54 | HR 75 | Temp 97.3°F | Resp 16

## 2022-05-05 DIAGNOSIS — C254 Malignant neoplasm of endocrine pancreas: Secondary | ICD-10-CM | POA: Insufficient documentation

## 2022-05-05 DIAGNOSIS — D3A8 Other benign neuroendocrine tumors: Secondary | ICD-10-CM | POA: Insufficient documentation

## 2022-05-05 DIAGNOSIS — Z5181 Encounter for therapeutic drug level monitoring: Secondary | ICD-10-CM

## 2022-05-05 LAB — POTASSIUM, BLOOD: Potassium: 5 mmol/L (ref 3.5–5.1)

## 2022-05-05 LAB — 2D ECHO WITH IMAGE ENHANCEMENT AGENT IF NECESSARY
IVC Diameter: 2.22 cm
PA Pressure: 26 mmHg

## 2022-05-05 MED ORDER — AMINO ACID 5 % IV SOLN
1000.0000 mL | Freq: Once | INTRAVENOUS | Status: AC
Start: 2022-05-05 — End: 2022-05-05
  Administered 2022-05-05: 1000 mL via INTRAVENOUS
  Filled 2022-05-05: qty 1000

## 2022-05-05 MED ORDER — OCTREOTIDE ACETATE 30 MG IM KIT
30.0000 mg | PACK | Freq: Once | INTRAMUSCULAR | Status: AC
Start: 2022-05-05 — End: 2022-05-05
  Administered 2022-05-05: 30 mg via INTRAMUSCULAR
  Filled 2022-05-05: qty 1

## 2022-05-05 MED ORDER — SODIUM CHLORIDE 0.9 % IV SOLN
Freq: Once | INTRAVENOUS | Status: AC
Start: 2022-05-05 — End: 2022-05-05

## 2022-05-05 MED ORDER — SODIUM CHLORIDE 0.9 % IV SOLN
10.0000 mg | Freq: Once | INTRAVENOUS | Status: AC
Start: 2022-05-05 — End: 2022-05-05
  Administered 2022-05-05: 10 mg via INTRAVENOUS
  Filled 2022-05-05: qty 1

## 2022-05-05 MED ORDER — FAMOTIDINE (PF) 20 MG/2ML IV SOLN
20.0000 mg | Freq: Once | INTRAVENOUS | Status: AC
Start: 2022-05-05 — End: 2022-05-05
  Administered 2022-05-05: 20 mg via INTRAVENOUS
  Filled 2022-05-05: qty 2

## 2022-05-05 MED ORDER — ONDANSETRON HCL 4 MG/2ML IV SOLN
8.0000 mg | Freq: Once | INTRAMUSCULAR | Status: AC
Start: 2022-05-05 — End: 2022-05-05
  Administered 2022-05-05: 8 mg via INTRAVENOUS
  Filled 2022-05-05: qty 4

## 2022-05-05 MED ORDER — LUTETIUM LU 177 DOTATATE 370 MBQ/ML IV SOLN
193.0000 | Freq: Once | INTRAVENOUS | Status: AC
Start: 2022-05-05 — End: 2022-05-05
  Administered 2022-05-05: 193 via INTRAVENOUS
  Filled 2022-05-05: qty 193

## 2022-05-05 MED ORDER — FAMOTIDINE (PF) 20 MG/2ML IV SOLN
20.0000 mg | Freq: Once | INTRAVENOUS | Status: DC | PRN
Start: 2022-05-05 — End: 2022-05-06

## 2022-05-05 MED ORDER — SODIUM CHLORIDE 0.9 % IV BOLUS
500.0000 mL | INJECTION | Freq: Once | INTRAVENOUS | Status: AC
Start: 2022-05-05 — End: 2022-05-05
  Administered 2022-05-05: 500 mL via INTRAVENOUS

## 2022-05-05 MED ORDER — ACETAMINOPHEN 325 MG PO TABS
650.0000 mg | ORAL_TABLET | Freq: Once | ORAL | Status: AC
Start: 2022-05-05 — End: 2022-05-05
  Administered 2022-05-05: 650 mg via ORAL
  Filled 2022-05-05: qty 2

## 2022-05-05 NOTE — Interdisciplinary (Signed)
Chemotherapy Nursing Note - [place]    Jenna Bridges is a 66 year old female who presents for chemotherapy cycle 1, day(s) 1 of lutathera.    Current Chemotherapy Signed Informed Consent verified/obtained yes, signed at chairside at start of lutathera.    Pre-treatment nursing assessment:  No problems identified upon assessment.  Pt arrives alert and oriented.  Ambulatory with steady gait.  Pt denies any nausea, vomiting, and diarrhea or constipation.  Pt denies any recent fevers or illness.  Reports eating and drinking appropriately.  #24 PIV placed in right hand and one in left hand.  No labs drawn, already resulted.   Pt pre medicated with tylenol, zofran, pepcid, and decadron, as per mar.    Patient met treatment parameters.    Lab Results   Component Value Date    ABSNEUTRO 3.7 05/03/2022    IANC 3.8 03/03/2022    WBC 5.7 05/03/2022    RBC 4.63 05/03/2022    HGB 13.8 05/03/2022    HCT 41.3 05/03/2022    MCV 89.2 05/03/2022    MCHC 33.4 05/03/2022    RDW 13.1 05/03/2022    PLT 251 05/03/2022       Lab Results   Component Value Date    NA 141 05/03/2022    K 5.0 05/05/2022    CL 103 05/03/2022    BICARB 29 05/03/2022    BUN 13 05/03/2022    CREAT 0.64 05/03/2022    GLU 95 05/03/2022    [place] 9.0 05/03/2022           Lab Results   Component Value Date    AST 24 05/03/2022    ALT 36 (H) 05/03/2022    ALK 212 (H) 05/03/2022    TP 7.9 05/03/2022    ALB 4.4 05/03/2022    TBILI 0.33 05/03/2022        Vitals:    05/05/22 0927 05/05/22 1300   BP: 125/73 (!) 101/54   BP Location: Left arm Left arm   BP Patient Position: Sitting Sitting   Pulse: 81 75   Resp: 16 16   Temp: 97.7 F (36.5 C) 97.3 F (36.3 C)   TempSrc: Oral Oral   SpO2: 96%      Pain Score: NA (pre med, non-pain or scheduled)  There is no height or weight on file to calculate BSA.  There is no height or weight on file to calculate BMI.    ECOG  ECOG: 0       Chemotherapy:  Jenna Bridges tolerated treatment well.    Post blood return: Brisk  IV access  post infusion: NS  Bilateral PIV/s dc'd with cath intact and bleeding controlled.   Pt cleared to leave by Gerald Stabs from neuc/med prior to leaving.     Medications   famotidine (PEPCID) injection 20 mg (has no administration in time range)   sodium chloride 0.9% infusion (0 mL IntraVENOUS Stopped 05/05/22 1431)   acetaminophen (TYLENOL) tablet 650 mg (650 mg Oral Given 05/05/22 0927)   ondansetron (ZOFRAN) injection 8 mg (8 mg IntraVENOUS Given 05/05/22 0929)   dexAMETHasone (DECADRON) 10 mg in sodium chloride 0.9 % 50 mL IVPB (0 mg IntraVENOUS Completed 05/05/22 0954)   famotidine (PEPCID) injection 20 mg (20 mg IntraVENOUS Given 05/05/22 0934)   amino acid 5 % IV bolus 1,000 mL (0 mL IntraVENOUS IV Stop 05/05/22 1420)   sodium chloride 0.9 % bolus 500 mL (0 mL IntraVENOUS Completed 05/05/22 1335)  octreotide (SANDOSTATIN LAR) depot injection 30 mg (30 mg IntraMUSCULAR Given 05/05/22 1514)         Patient Education  Learner: Patient  Barriers to learning: No Barriers  Readiness to learn: Acceptance  Method: Explanation    Return information reviewed with pt.   Pt understands she needs a appointment for sandostatin in 3 weeks.  Pt prefers to call [facility] to make that appointment.     Treatment Education:       Signs and symptoms of infection, bleeding, adverse reaction(s), symptom control, and when to notify MD.    Patient instructed on post-treatment care and precautions specific to drug(s) administered.   If applicable, education given on vesicant administration risk and signs and symptoms of extravasation (redness, pain, swelling, pressure at IV site) to report immediately to the chemotherapy RN.    Fall Prevention Education: Instructed patient to call for assistance, call light within reach.   Pain Education: Patient instructed to contact nurse if pain should develop or if their current pain therapy becomes ineffective.       Treatment education provided to patient: yes    Response: Verbalizes  understanding    Discharge Plan  Discharge instructions given to patient.  Future appointments:date given and reviewed with treatment plan.  Discharge Mode: Ambulatory  Discharge Time: 1545    Accompanied by: Self, husband giving pt a ride home.  Is meeting him downstairs.  Discharged To: Home

## 2022-05-05 NOTE — Telephone Encounter (Signed)
[facility] Call [place] Message for MD/RN:    Incoming call received from: Patient     Provider: Dr. Ane Payment    Reason for call: Pt's states she got a call from [place], I dont see no notes on the missed call , Pt was calling in today to return the call , I told her I would send a message in to see if anybody was trying to reach her from our  office , she will be calling in tomorrow morning to schedule her follow up     Return call requested: No     Best callback number: [phone]    [facility] to leave a detailed message: Yes     Inform Caller:    The timeframe for return calls is typically within 24 hours or before the end of the business day.

## 2022-05-06 ENCOUNTER — Telehealth (HOSPITAL_BASED_OUTPATIENT_CLINIC_OR_DEPARTMENT_OTHER): Payer: Self-pay

## 2022-05-06 NOTE — Telephone Encounter (Signed)
Received VM from pt for Jenna Bridges, [facility] stating that she was advised "by the [place] people" to schedule next Sandostatin appt for 12/12, but was informed by [facility] schedulers that no orders are in for this.    Chart reviewed. Sandostatin treatment plan ended on 03/31/2022.    Routing to MD and NP to please enter orders, if appropriate.

## 2022-05-11 ENCOUNTER — Encounter (HOSPITAL_BASED_OUTPATIENT_CLINIC_OR_DEPARTMENT_OTHER): Payer: Self-pay | Admitting: Hematology & Oncology

## 2022-05-11 DIAGNOSIS — D3A8 Other benign neuroendocrine tumors: Secondary | ICD-10-CM

## 2022-05-26 NOTE — Patient Instructions (Addendum)
INSTRUCTIONS:    Complete sandostatin injection tomorrow, 05/28/22.  Revisit with Dr. Ane Payment in 4 weeks.    _____________________________________________________________________    Your Team:      PHYSICIAN: Margreta Journey, MD PhD      NURSE PRACTITIONER: Hester Mates, NP       NURSE CASE MANAGER: Baldo Daub RN    Phone: [phone]      I may be unable to answer phone calls/voicemails immediately during clinic days, so if you have an urgent question please call our call center [phone]) and they can page me.  You can also choose to speak to the triage nurse for urgent medical questions by selecting that option when you call me.     ADMINISTRATIVE ASSISTANT: Graylon Good (Paperwork, obtaining imaging, scheduling)  Phone: [phone]  Fax: [phone]    PATIENT NAVIGATOR: Dellie Burns   Phone: [phone]     *IF you are receiving infusions, please check with the infusion center for a code to enable you to receive free parking on infusion days. Please call [phone] Option 2, or stop at the front desk on your way out. These codes change monthly*                                                                                                                                                 If you have a medical emergency or life threatening situaton, please call 911 or go to the nearest Emergency Room.    AFTER-HOURS CARE:    If you develop a fever of 100.18F or greater after hours (On the weekends and/or Monday through Friday before 8:00am or after 4:30pm) please call [phone] and ask to speak with the On-Call Oncologist. Please also call the On-Call Oncologist for any urgent symptoms that happen after normal business hours.    AFTER HOURS EMERGENCY NUMBER [phone]                       Ask for the Oncology Physician On-Call     The following link is a video that will help explain choosing a healthcare proxy and the process of advanced care planning.    [email or web address].com/program-preview-who-speaks-[email or web address]-you/    [facility] Phone List for Patients     Clinic Hours of Operation: Monday-Friday 8:00- 4:30 p.m. (closed holidays and weekends)   Infusion Center Hours: Monday-Friday 7:00-9:30 p.m.; Sat-Sun 8:00-4:30pm.     Social Worker:   GI cancers: Aris Georgia, [place], [phone]  Pancreas cancer: Lina Sar, LCSW, [phone]  For emotional, social, spiritual and practical needs that may arise throughout cancer treatment and recovery.    Information Desk/Call Center: [phone]   ** General information: directions, telephone numbers, or available services     [place]: [phone], option 2  ** Call to schedule, cancel, or reschedule chemotherapy appointments  [place] Scheduling: [phone] - [place] Scheduling: [phone]    Radiation Oncology: [phone]   ** Call to schedule, cancel, or reschedule radiation appointments     Financial Counselor: Esperanza Richters [phone]   [email or web address]   [phone] is the centralized billing and insurance information number with a team of knowledgeable representatives ready to help M-F 9am to 6pm.     Registration: [phone] (to update personal information)    [place]:   [phone]  Open M-F 9-6pm    Radiology Scheduling: [phone]   For PET scans: [phone]    Medical Records: [phone]  Fax: [phone]    Chemotherapy Online Education:  Norm Parcel to recorded Chemotherapy & Immunotherapy Education Class: [email or web address]     Patient and [place]:  The Patient and [facility]) offers: The most up-to-date cancer information for patients, families, and the community. Informative brochures, videos, and books to help you learn about your cancer  diagnosis, treatment options, managing symptoms and side effects, clinical trials, nutrition, coping, caregiving, and self-care. Computers with Internet access to the most comprehensive and credible websites for cancer information and community resources. Various comfort items such as lap blankets, pillows, hats, and wigs.  Open daily; hours vary   Location: [facility], [place], Room 1066, on the first floor just to the left of the lobby elevators  Phone [phone]     *If you get a bill from a molecular testing company ([place], [place], [place], [place], [place]) please let us know before you pay. Our staff can help resolve this.

## 2022-05-27 ENCOUNTER — Ambulatory Visit: Payer: Medicare Other | Attending: Hematology & Oncology | Admitting: Hematology & Oncology

## 2022-05-27 VITALS — BP 147/74 | HR 92 | Temp 98.3°F | Resp 16 | Ht 65.16 in | Wt 152.0 lb

## 2022-05-27 DIAGNOSIS — K8681 Exocrine pancreatic insufficiency: Secondary | ICD-10-CM | POA: Insufficient documentation

## 2022-05-27 DIAGNOSIS — C254 Malignant neoplasm of endocrine pancreas: Secondary | ICD-10-CM | POA: Insufficient documentation

## 2022-05-27 DIAGNOSIS — T451X5A Adverse effect of antineoplastic and immunosuppressive drugs, initial encounter: Secondary | ICD-10-CM | POA: Insufficient documentation

## 2022-05-27 DIAGNOSIS — R5383 Other fatigue: Secondary | ICD-10-CM | POA: Insufficient documentation

## 2022-05-27 DIAGNOSIS — Z09 Encounter for follow-up examination after completed treatment for conditions other than malignant neoplasm: Secondary | ICD-10-CM | POA: Insufficient documentation

## 2022-05-27 DIAGNOSIS — C7A8 Other malignant neuroendocrine tumors: Secondary | ICD-10-CM | POA: Insufficient documentation

## 2022-05-27 DIAGNOSIS — Z79899 Other long term (current) drug therapy: Secondary | ICD-10-CM | POA: Insufficient documentation

## 2022-05-27 NOTE — Interdisciplinary (Signed)
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 Ane Payment     Fall Risk  1 or more of the following as reported by patient/caregiver = patient risk for falls: No fall risk identified     Interventions implemented during clinic visit: Oriented to room;Door left open w/ curtain used    Fall Risk Interventions:   No fall risk identified; no interventions at this time     Education provided to patient/caregiver on fall risk factors and precautions.      Nutrition  Malnutrition Screening Tool (MST): 0     Nutrition Interventions:  MST score < 2, referral not indicated, no additional questions or concerns     Education provided on importance of early nutrition intervention for improved symptom management and outcomes, with in-person or telehealth visit and family involvement.     Wellbeing Screening    Screening      No questionnaires available.                        Wellbeing Screening Interventions: No issues identified; no interventions at this time    Patient not on active oral cancer-directed therapy at this time    Pain   Pain Score  Pain Score: 0    Pain interventions:    No intervention needed at this time    Language: [place]           Nursing Education: Educated on plan of care, office contact, and AVS using teach-back method.     Teaching outcomes:      Patient/caregiver verbalized understanding     Patient/caregiver expressed understanding of plan of care and questions answered to patient satisfaction     Education Materials Provided: Additional written education materials not provided for this visit    Plan of care:   Sandostatin injection tomorrow, 12/7  Lutathera 06/30/21   Revisit with Dr. Ane Payment in 4 weeks

## 2022-05-27 NOTE — Progress Notes (Signed)
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 NVeverly Fellsat BCro[place]onon octreotide since 08/2017. Previous to this noted watery diarrhea and had increase from 274mto 3022mith good resolution. Has had no abdominal pain or carcinoid symptoms. Recent CT CAP and MRI of pancreas October 2020 found new hepatic lesions and slight interval growth compared with 1 year prior.    She is here to discuss additional treatment options for her and overall prognosis.     05/22/2020: presents alone. [facility] and that [facility] that says there is progression on her MRI scans but they continued on octreotide 49m37m she was clinically well. Otherwise notes diarrhea if her injection is delayed. [facility] then will need 06/2020 injection from that point forward.     07/18/2020: presents alone. Recent ED visit (1/24) with abdominal pain on right abdomen subcostal, severe when lying down. Restaged at that time showing minor right pleural thickening above right hepatic disease. Scattered 4mm 8mules.No pain has returned, no carcinoid issues.    10/17/2020:  Presents alone. On monthly Sandostatin LAR, tolerating well. Denies flushing, abdominal pain, or diarrhea.    11/07/2020: presents alone today. Restaging scans completed with SD. No diarrhea, flushing, pain.    03/26/2021: presents alone today. Stress about scans but stable since 10/2020. OK with three times a year scans. No issues with carcinoid syndrome. Has RUQ pain stable, believes from  stress. Discussion on injection and depot with previous scar tissue.    05/29/2021: presents today alone.  Has no particular symptoms of carcinoid syndrome.  Her last scans done September 2022 show stable disease without any progression.    02/19/2022:   Denies flushing, diarrhea for the past several years (though these were presenting sx in Fall 2018). Reports concerns with injection of sandostatin - problems with needle breaking and was concerned that she wasn't getting the whole dose because she didn't have an injection site reaction. Also had an episode of significant bruising after last infusion with some continued discomfort to palpation. Last injection 8/15.  Denies fevers, chills, bony pain, abdominal distension, changes in BM, abdominal pain, n/v.     03/19/2022: presents today alone. She had PET Cu64 complete with disease in liver and pancreatic tail with T8 lesion. She has no symptoms and no back pain. A lot of family issues with husband celiac, daughter crohns, daughter in law with cervical dystonia. Would like to know options for treatment. Without diarrhea, flushing, pain.     05/27/2022: tolerated treatment well without issues. No vomiting but had nausea. Went through about 3 bottles of water. Had a syncopal episode at 10pm after getting off the couch and going to the restroom. Husband found her on the floor, no injuries but rib pain on the left. Noted she was next to neuropsych unit and could hear yelling next door. Otherwise tolerated therapy well and husband took her home without issues.  Review of systems: pain from left rib s/p fall, less anxiety with treatment completion and very talkative, all other systems reviewed and are negative except for what is described in the history of present illness.    Oncologic Timeline:  09/09/2017-04/06/2019: Octreotide Qmonthly  04/27/2019: [place] [facility] Consult  03/2020: Per report, progression on MRI, continues on octreotide 70m monthly  05/22/2020:  F/U Dr. BAne Payment 07/15/2020: ED visit with pain, CTA negative  07/18/2020: RV Dr. BAne Payment 2/20/222: MRI AP- hepatic lesions similar in size  10/17/2020: RV Dr BAne Payment 11/07/2020: RV Dr. BAne Payment 03/26/2021: RV  05/26/2021: RV  07/27/2020: RV with new staging  02/19/2022: RV with new staging  03/19/2022: plan to move to lutathera  05/05/2022: Lutathera D1  05/28/2022: Octreotide  06/30/2022: Lutathera D2    -----------------------------  Past Medical History:   Past Medical History:   Diagnosis Date    Liver metastases (CMS-HCC) 05/05/2019       Medications:   Current Outpatient Medications:     COVID-19 mRNA vaccine (PFIZER) 30 MCG/0.3ML injection, , Disp: , Rfl:     octreotide (SANDOSTATIN LAR) 30 MG injection, 30 mg by IntraMUSCULAR route every 28 days., Disp: , Rfl:     Allergies:   No Known Allergies    Social history:   Alcohol: None  Tobacco: None  Drugs: None  Living Situation: Lives with husband, daughter is a PhD sPersonal assistantat [employer] [place]history:  Father: Parkinson/Lewy Body Dementia  Cousins: breast, brain, colon cancer and lymphoma    -----------------------------  Physical exam:  Vitals:BP 147/74 (BP Location: Left arm, BP Patient Position: Sitting, BP cuff size: Regular)   Pulse 92   Temp 98.3 F (36.8 C) (Temporal)   Resp 16   Ht 5' 5.16" (1.655 m)   Wt 68.9 kg (152 lb)   SpO2 96%   BMI 25.17 kg/m   Wt Readings from Last 3 Encounters:   05/27/22 68.9 kg (152 lb)   05/03/22 69.4 kg (153 lb)   03/19/22 69.4 kg (153 lb)     PHYSICAL EXAM   (unchanged)  ECOG: 0  GENERAL APPEARANCE: The patient is an alert, cooperative female in no acute distress.  SKIN: No lesions or rashes. No jaundice  EYES: PERRLA, EOMs are intact. No icterus.   EAR, NOSE, AND THROAT: The oropharynx is clear and moist.   LYMPH NODES: No appreciable observable lymphadenopathy  CHEST: The lungs are clear to auscultation. Normal respiratory effort  HEART: RRR S1 and S2 are normal.  There are no murmurs or extra sounds.  ABDOMEN:  The  abdomen is soft and nontender. +BS. There is no hepatosplenomegaly. R buttock with some discomfort with palpation but no bruising.   EXTREMITES: There is no edema or cyanosis. Joints are normal without redness or swelling.  NEURO:  Mental status is normal.  No focal neurologic findings.           -----------------------------  Labs: Reviewed by myself  Lab Results   Component Value Date    WBC 5.7 05/03/2022    RBC 4.63 05/03/2022    HGB 13.8 05/03/2022    HCT 41.3 05/03/2022    MCV 89.2 05/03/2022    MCHC 33.4 05/03/2022    RDW 13.1 05/03/2022    PLT 251 05/03/2022    MPV 9.7 05/03/2022     Lab Results   Component Value Date    BUN 13 05/03/2022    CREAT 0.64 05/03/2022  CL 103 05/03/2022    NA 141 05/03/2022    K 5.0 05/05/2022    [place] 9.0 05/03/2022    TBILI 0.33 05/03/2022    ALB 4.4 05/03/2022    TP 7.9 05/03/2022    AST 24 05/03/2022    ALK 212 (H) 05/03/2022    BICARB 29 05/03/2022    ALT 36 (H) 05/03/2022    GLU 95 05/03/2022     Pancreatic Polypeptide 07/2017: 837  Chromogranin A 07/2017: 1  CA19-9 07/2017: 8  Alk Phos 11/2018: 176    Colonoscopy    Radiology: Reviewed by myself in conjunction with radiology report  Nuc Med Lutathera LU-177 Injection    Result Date: 05/05/2022  IMPRESSION: 193 mCi of Lutathera was administered.     Nuc Med Consult 52 Min    Result Date: 04/02/2022  IMPRESSION: 1. Discontinue long-acting somatostatin analogs for at least 4 week prior to each Lutathera infusion. 2. Discontinue short-acting somatostatin analogs for at least 24 hours prior to each Lutathera infusion. 3. Verify pregnancy status in female patients prior to initiating Lutathera infusion. 4. Standard dose of Lutathera ordered 7.4 GBq (200 mCi) every 8 week for total of 4 doses. 5. On day of treatment, patient to report to Nuclear Medicine Department, Basement, Room [address], [address], [place] [address]. [phone] 6. Blood tests every 2 weeks for 34 weeks after first Lutathera infusion. *NUREG-1556 Vol 9,  Rev. 2 Appendix U      03/2020 [facility] Report      04/04/2019 MRI Pancreas    CT Chest 03/27/2019  - No evidence of thoracic metastatic disease    CT A/P 03/24/2019    PET Gallium 68  - Primary pancreatic mass with intense radiotracer uptake and multiple hepatic metastatic lesions    Pathology:    Ki67 3%    Molecular:    PDL1 0%      -----------------------------  Assessment & Plan:  Dalary Hollar is a 66 year old female presenting with metastatic PNET with progression on 43m Octreotide Monthly + Lutathera    We discussed with the patient and their family the natural course of NET. We discussed how many of these come from GI sources including the pancreas, colon, and stomach. Occasionally they are seen from the lung as well. We discussed that they can be indolent and slow growing or quickly capable of rapid growth and spread. Treatment options for symptom relief include somatostatin analogues. Although fewer than 10% of patients see tumor shrinkage with these drugs, prolonged stable disease is seen in 66% which delays progression.     We discussed that if there was interval growth on the next imaging further options include everolimus, CAPTEM, TKIs (Sunitinib, sorafenib, pazopanib, and cabozantinib) and Lutathera. We will also evaluate the ability to send tissue for NGS today with [facility]    Ultimately we would continue octreotide at this time and add everolimus. We discussed evaluating in 2 months to see if growth stopped or continued. After this we would then recommend either CAPTEM or Lutathera. We discussed the side effects dermatologically of everolimus and instructed the patient to pre-empt the rash with a good face hygiene regimen.     Would have an RV in January to discuss restaging scans.    05/22/2020: We will port her scans in [facility] now given 3 month interval. She previously had a PET GA68 to follow as well for LTurin We discussed increasing octreotide or adding  regorafenib/everolimus in  addition next. We will schedule her for her 06/2020 injection as well to reduce her diarrheal burden. RV monthly until stability. NGS completed as above and added to chart. 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 We discussed MEN can be used as a screening tool for future cancers and she will talk to them but doesn't want to make decision now.    07/18/2020: pain may be referred from hepatic disease, no obvious right effusion needing thoracentesis. Pain has resolved and no diarrheal symptoms. Would recommend quick interval scan in 2 months time, mid March. Continue injections of octreotide.    10/17/2020: No symptoms of carcinoid. Tolerating Sandostatin LAR without issues. Labs today (CBC CMP TSH Vit B12). Can use these labs for CT AP scheduled for 5/11. Continue Sandostatin injections and follow up after imaging.     11/07/2020: doing very well, scans show stability. No nutritional deficiencies seen on labs. Continue with therapy regularly each month. Restage in 3-6 months.    03/26/2021: continues to do very well. RUQ pain likely related to either liver capsule stretching or possible anxiety. Stable disease on scans without evidence of carcinoid syndrome. Again discussion on MEN1 and screening for pituitary and thyroid disorders and declines. Also discussed genetics counseling and family but declines as well. Will see her in 2 months with scans in 4 months.     05/29/2021:  Continuing to do very well on current dosage and schedule.  We will restage in 6 months' time.    02/19/2022: CT of chest, abdomen, and pelvis significant for interval increase in liver lesions as well as increased lymphadenopathy and increased sclerotic lesion on T8 vertebral body, but similar size of pancreatic tail mass. Discussed next steps including possible treatments such as everolimus, IR  ablation, chemotherapy, and lutathera. Plan to continue octreotide and obtain a PET-copper scan to better characterize size and location of lesions. Will determine additional treatment based on results of PET.     03/19/2022: at this time with PET Cu64 showing diffuse and progressive disease discussed multiple options for improved control. Discussed biopsy of newly growing area but patient declines at this time. Discussed moving to lanreotide but concern for definitive control, more likely slow growth over time. Does not wish to take a daily pill or oral chemotherapy. As such Lutathera may be a more appropriate route. It does not disrupt her day to day and allows her to continue with a more effective, targeted approach that appeals to her. Will have the Nuclear Medicine team reach out to her to schedule and work with them to align our octreotide schedule with her treatment.     05/27/2022: will continue with Lutathera every 8 weeks. Will have octreotide tomorrow here at [facility]. Has weight gain and energy. Left rib pain likely from fall and rib pain and MSK bruising but explained it will take months to heal. Discussed at length her chronic treatment regimen is tiresome and its ok to feel stressed or tired or take care of herself over others.           ICD-10-CM ICD-9-CM   1. Malignant neoplasm of endocrine pancreas (CMS-HCC)  C25.4 157.4   2. Neuroendocrine carcinoma (CMS-HCC)  C7A.8 209.20   3. Post chemo evaluation  Z09 V67.2   4. Long term current use of therapeutic drug   Z79.899  V58.69   5. Chemotherapy-induced fatigue  R53.83 780.79    T45.1X5A E933.1   6. 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 We discuss alternative treatments, expected side effects, and rare occurrences. Further the patient and I discuss the expected outcomes as well as contact number for anyone on our team.      I spent a total of 69  minutes on the day of the visit.    Margreta Journey MD/PhD      -----------------------------  Lab   Orders Placed This Encounter   Procedures    CBC w/ Diff [place]    Comprehensive Metabolic Panel    HCG Quantitative, Blood Green Plasma Separator Tube    CBC w/ Diff Lavender    Comprehensive Metabolic Panel    CBC w/ Diff Lavender    Comprehensive Metabolic Panel    HCG Quantitative, Blood Green Plasma Separator Tube    CBC w/ Diff Lavender    Comprehensive Metabolic Panel    CBC w/ Diff Lavender    Comprehensive Metabolic Panel    HCG Quantitative, Blood Green Plasma Separator Tube    CBC w/ Diff Lavender    Comprehensive Metabolic Panel     Imaging   No orders of the defined types were placed in this encounter.    Procedures   Orders Placed This Encounter   Procedures    [place] Service Request     Other No orders of the defined types were placed in this encounter.

## 2022-05-28 ENCOUNTER — Telehealth (HOSPITAL_BASED_OUTPATIENT_CLINIC_OR_DEPARTMENT_OTHER): Payer: Self-pay | Admitting: Hematology & Oncology

## 2022-05-28 ENCOUNTER — Ambulatory Visit
Admission: RE | Admit: 2022-05-28 | Discharge: 2022-05-28 | Disposition: A | Discharge status: Home / Self Care | Payer: Medicare Other | Attending: Hematology & Oncology | Admitting: Hematology & Oncology

## 2022-05-28 VITALS — BP 134/73 | HR 82 | Temp 97.8°F | Resp 17 | Ht 65.16 in | Wt 151.9 lb

## 2022-05-28 DIAGNOSIS — C787 Secondary malignant neoplasm of liver and intrahepatic bile duct: Secondary | ICD-10-CM | POA: Insufficient documentation

## 2022-05-28 DIAGNOSIS — C254 Malignant neoplasm of endocrine pancreas: Secondary | ICD-10-CM | POA: Insufficient documentation

## 2022-05-28 DIAGNOSIS — E340 Carcinoid syndrome: Secondary | ICD-10-CM | POA: Insufficient documentation

## 2022-05-28 MED ORDER — OCTREOTIDE ACETATE 30 MG IM KIT
30.0000 mg | PACK | Freq: Once | INTRAMUSCULAR | Status: AC
Start: 2022-05-28 — End: 2022-05-28
  Administered 2022-05-28: 30 mg via INTRAMUSCULAR
  Filled 2022-05-28: qty 1

## 2022-05-28 NOTE — Interdisciplinary (Signed)
Nurses Note: Patient in for Octreotide injection.  Vital signs stable and in no apparent distress.  Pt reports no nausea, vomiting, constipation, or diarrhea.  Pt given ongoing medication teaching.    Octreotide 46m given IM in R buttocks.  Patient tolerated injection.  Patient discharged to home in no apparent distress.  Return to clinic in 4 week(s) for Octreotide. [facility].     Medications   octreotide (SANDOSTATIN LAR) depot injection 30 mg (30 mg IntraMUSCULAR Given 05/28/22 0915)      05/28/22  0857   BP: 134/73   Pulse: 82   Temp: 97.8 F (36.6 C)   Resp: 17   SpO2: 99%

## 2022-05-28 NOTE — Telephone Encounter (Signed)
[place] Pancreas Clinical Trial Screening     Stage: Mestatic PNET (Ki67 of 3%)  Location: Malignant neoplasm of endocrine pancreas      If Metastatic 2nd Line (experimental/Targeted), patient is a candidate for:        1. Albina Billet  KrasG12C Targeting Adagrasib  PIMelodye Ped  CRC: Lung Team         2. UIVHOY-431  HER2 Targeting Tucatinib + Trastuzumab  PI: [place]  CRC: GU Team         3. TAK-676   TAK-676+/- Pembrolizumab  PI: S. Posey Pronto  CRC: Lorna Few - [email or web address]            Jona Plevin - [email or web address]          4. BASECAMP  Observational: Tempusr xT-Onco to confirm HLA-A*02 LOH  PI: S. Patel  CRC: Jona Plevin - [email or web address]            [place] - [email or web address]           5. TMPS-101    Niraparib in Tumors with PALB2 Mutations  PI: [place]  CRC: Jesus Velazco Guerrero - [email or web address]           In [place]: [place]  [email or web address]

## 2022-06-26 ENCOUNTER — Other Ambulatory Visit (HOSPITAL_BASED_OUTPATIENT_CLINIC_OR_DEPARTMENT_OTHER): Payer: Self-pay | Admitting: Hematology & Oncology

## 2022-06-26 ENCOUNTER — Telehealth (HOSPITAL_BASED_OUTPATIENT_CLINIC_OR_DEPARTMENT_OTHER): Payer: Self-pay | Admitting: Hematology & Oncology

## 2022-06-26 DIAGNOSIS — D3A8 Other benign neuroendocrine tumors: Secondary | ICD-10-CM

## 2022-06-26 NOTE — Telephone Encounter (Signed)
Left reminder message at [place] must be done Monday 06/29/22 for the [place] treatment on 06/30/22.Any questions, I can be reached at [phone]

## 2022-06-29 ENCOUNTER — Other Ambulatory Visit: Payer: Medicare Other | Attending: Hematology & Oncology

## 2022-06-29 DIAGNOSIS — R5383 Other fatigue: Secondary | ICD-10-CM | POA: Insufficient documentation

## 2022-06-29 DIAGNOSIS — Z79899 Other long term (current) drug therapy: Secondary | ICD-10-CM | POA: Insufficient documentation

## 2022-06-29 DIAGNOSIS — Z09 Encounter for follow-up examination after completed treatment for conditions other than malignant neoplasm: Secondary | ICD-10-CM | POA: Insufficient documentation

## 2022-06-29 DIAGNOSIS — T451X5A Adverse effect of antineoplastic and immunosuppressive drugs, initial encounter: Secondary | ICD-10-CM | POA: Insufficient documentation

## 2022-06-29 DIAGNOSIS — C254 Malignant neoplasm of endocrine pancreas: Secondary | ICD-10-CM | POA: Insufficient documentation

## 2022-06-29 DIAGNOSIS — D3A8 Other benign neuroendocrine tumors: Secondary | ICD-10-CM | POA: Insufficient documentation

## 2022-06-29 DIAGNOSIS — K8681 Exocrine pancreatic insufficiency: Secondary | ICD-10-CM | POA: Insufficient documentation

## 2022-06-29 DIAGNOSIS — C7A8 Other malignant neuroendocrine tumors: Secondary | ICD-10-CM | POA: Insufficient documentation

## 2022-06-29 LAB — CBC WITH DIFF, BLOOD
ANC-Automated: 3.2 10*3/uL (ref 1.6–7.0)
ANC-Instrument: 3.2 10*3/uL (ref 1.6–7.0)
Abs Basophils: 0 10*3/uL (ref <0.2)
Abs Eosinophils: 0.1 10*3/uL (ref 0.0–0.5)
Abs Lymphs: 1.2 10*3/uL (ref 0.8–3.1)
Abs Monos: 0.6 10*3/uL (ref 0.2–0.8)
Basophils: 1 %
Eosinophils: 1 %
Hct: 41.8 % (ref 34.0–45.0)
Hgb: 14.3 gm/dL (ref 11.2–15.7)
Lymphocytes: 23 %
MCH: 30.3 pg (ref 26.0–32.0)
MCHC: 34.2 g/dL (ref 32.0–36.0)
MCV: 88.6 um3 (ref 79.0–95.0)
MPV: 9.5 fL (ref 9.4–12.4)
Monocytes: 12 %
Plt Count: 205 10*3/uL (ref 140–370)
RBC: 4.72 10*6/uL (ref 3.90–5.20)
RDW: 13.3 % (ref 12.0–14.0)
Segs: 63 %
WBC: 5.1 10*3/uL (ref 4.0–10.0)

## 2022-06-29 LAB — COMPREHENSIVE METABOLIC PANEL, BLOOD
ALT (SGPT): 41 U/L — ABNORMAL HIGH (ref 0–33)
AST (SGOT): 28 U/L (ref 0–32)
Albumin: 4.7 g/dL (ref 3.5–5.2)
Alkaline Phos: 212 U/L — ABNORMAL HIGH (ref 40–130)
Anion Gap: 8 mmol/L (ref 7–15)
BUN: 10 mg/dL (ref 8–23)
Bicarbonate: 32 mmol/L — ABNORMAL HIGH (ref 22–29)
Bilirubin, Tot: 0.48 mg/dL (ref <1.2)
Calcium: 9.8 mg/dL (ref 8.5–10.6)
Chloride: 101 mmol/L (ref 98–107)
Creatinine: 0.54 mg/dL (ref 0.51–0.95)
Glucose: 94 mg/dL (ref 70–99)
Potassium: 4.3 mmol/L (ref 3.5–5.1)
Sodium: 141 mmol/L (ref 136–145)
Total Protein: 8.1 g/dL — ABNORMAL HIGH (ref 6.0–8.0)
eGFR Based on CKD-EPI 2021 Equation: >60 mL/min/{1.73_m2}

## 2022-06-29 LAB — HCG QUANTITATIVE, BLOOD: HCG Qt: 4 m[IU]/mL

## 2022-06-30 ENCOUNTER — Ambulatory Visit
Admission: RE | Admit: 2022-06-30 | Discharge: 2022-06-30 | Disposition: A | Discharge status: Home / Self Care | Payer: Medicare Other | Attending: Hematology & Oncology | Admitting: Hematology & Oncology

## 2022-06-30 ENCOUNTER — Ambulatory Visit (HOSPITAL_BASED_OUTPATIENT_CLINIC_OR_DEPARTMENT_OTHER): Admit: 2022-06-30 | Discharge: 2022-06-30 | Disposition: A | Discharge status: Home Health | Payer: Medicare Other

## 2022-06-30 VITALS — BP 122/78 | HR 88 | Temp 97.8°F | Resp 16 | Ht 65.0 in | Wt 151.9 lb

## 2022-06-30 DIAGNOSIS — C254 Malignant neoplasm of endocrine pancreas: Secondary | ICD-10-CM

## 2022-06-30 DIAGNOSIS — D3A8 Other benign neuroendocrine tumors: Secondary | ICD-10-CM | POA: Insufficient documentation

## 2022-06-30 LAB — POTASSIUM, BLOOD: Potassium: 5.2 mmol/L — ABNORMAL HIGH (ref 3.5–5.1)

## 2022-06-30 MED ORDER — ONDANSETRON HCL 4 MG/2ML IV SOLN
8.00 mg | Freq: Once | INTRAMUSCULAR | Status: AC
Start: 2022-06-30 — End: 2022-06-30
  Administered 2022-06-30: 8 mg via INTRAVENOUS
  Filled 2022-06-30: qty 4

## 2022-06-30 MED ORDER — ACETAMINOPHEN 325 MG PO TABS
650.00 mg | ORAL_TABLET | Freq: Once | ORAL | Status: AC
Start: 2022-06-30 — End: 2022-06-30
  Administered 2022-06-30: 650 mg via ORAL
  Filled 2022-06-30: qty 2

## 2022-06-30 MED ORDER — SODIUM CHLORIDE 0.9 % IV BOLUS
500.00 mL | INJECTION | Freq: Once | INTRAVENOUS | Status: AC
Start: 2022-06-30 — End: 2022-06-30
  Administered 2022-06-30: 500 mL via INTRAVENOUS

## 2022-06-30 MED ORDER — SODIUM CHLORIDE 0.9 % IV SOLN
Freq: Once | INTRAVENOUS | Status: AC
Start: 2022-06-30 — End: 2022-06-30

## 2022-06-30 MED ORDER — OCTREOTIDE ACETATE 30 MG IM KIT
30.00 mg | PACK | Freq: Once | INTRAMUSCULAR | Status: AC
Start: 2022-06-30 — End: 2022-06-30
  Administered 2022-06-30: 30 mg via INTRAMUSCULAR
  Filled 2022-06-30: qty 1

## 2022-06-30 MED ORDER — FAMOTIDINE (PF) 20 MG/2ML IV SOLN
20.00 mg | Freq: Once | INTRAVENOUS | Status: AC
Start: 2022-06-30 — End: 2022-06-30
  Administered 2022-06-30: 20 mg via INTRAVENOUS
  Filled 2022-06-30: qty 2

## 2022-06-30 MED ORDER — SODIUM CHLORIDE 0.9 % IV SOLN
10.00 mg | Freq: Once | INTRAVENOUS | Status: AC
Start: 2022-06-30 — End: 2022-06-30
  Administered 2022-06-30: 10 mg via INTRAVENOUS
  Filled 2022-06-30: qty 1

## 2022-06-30 MED ORDER — AMINO ACID 5 % IV SOLN
1000.00 mL | Freq: Once | INTRAVENOUS | Status: AC
Start: 2022-06-30 — End: 2022-06-30
  Administered 2022-06-30: 1000 mL via INTRAVENOUS
  Filled 2022-06-30: qty 1000

## 2022-06-30 MED ORDER — FAMOTIDINE (PF) 20 MG/2ML IV SOLN
20.00 mg | Freq: Once | INTRAVENOUS | Status: DC | PRN
Start: 2022-06-30 — End: 2022-07-01

## 2022-06-30 MED ORDER — LUTETIUM LU 177 DOTATATE 370 MBQ/ML IV SOLN
193.0000 | Freq: Once | INTRAVENOUS | Status: AC
Start: 2022-06-30 — End: 2022-06-30
  Administered 2022-06-30: 193 via INTRAVENOUS
  Filled 2022-06-30: qty 193

## 2022-06-30 NOTE — Interdisciplinary (Signed)
Chemotherapy Nursing Note - [facility]  67 year old  @ [place] for [facility] ICD-9-CM   1. Malignant neoplasm of endocrine pancreas (CMS-HCC)  C25.4 157.4     Chief Complaint:  Line Care, Hydration, and Infusion Non Chemotherapy    Current Chemotherapy Signed Informed Consent: yes -- Consent was signed at chairside prior to [place] infusion and MD at chairside as well.    Vital Signs:  BP 122/78 (BP Location: Left arm, BP Patient Position: Sitting)   Pulse 88   Temp 97.8 F (36.6 C) (Temporal)   Resp 16   Ht '5\' 5"'$  (1.651 m)   Wt 68.9 kg (151 lb 14.4 oz)   SpO2 98%   BMI 25.28 kg/m   Lab Results   Component Value Date    WBC 5.1 06/29/2022    RBC 4.72 06/29/2022    HGB 14.3 06/29/2022    HCT 41.8 06/29/2022    MCV 88.6 06/29/2022    MCHC 34.2 06/29/2022    RDW 13.3 06/29/2022    PLT 205 06/29/2022    MPV 9.5 06/29/2022      Lab Results   Component Value Date    AST 28 06/29/2022    ALT 41 (H) 06/29/2022    ALK 212 (H) 06/29/2022    TP 8.1 (H) 06/29/2022    ALB 4.7 06/29/2022    TBILI 0.48 06/29/2022     Pre-treatment Nursing Assessment:   Presents to Infusion Center alert, oriented, and ambulatory, accompanied by self  Denies fever, chills, or night sweats  Denies chest pain or palpitations  Denies respiratory symptoms including cough, shortness of breath, dyspnea on exertion  Denies nausea, vomiting, diarrhea, constipation   Denies hematuria, dysuria, frequency, urgency  Denies numbness/tingling in hands or feet  Access:  Right and left hand PIV  Chemotherapy:  Lutathera  Frequency:  Q8 weeks x 4  Treatment Notes:  Patient tolerated infusion well and without complication.  Post Potassium level 5.2. No interventions required per treatment plan.  Sandostatin administered to left buttock per patient request.  Bilateral PIV flushed, removed, and pressure dressing applied  Treatment Education:   Information/teaching given to patient including: signs and symptoms of infection,  bleeding, adverse reaction(s), symptom control, and when to notify MD.  Follow-up Appointments:  Return in 29 days (on 07/29/2022) for FAST TRACK, INJECTION.  Discharge instructions given to patient.  Future appointments:date given and reviewed with treatment plan and requested appointment for 2/7.  Discharge Mode: Ambulatory  Discharge Time: 1435  Accompanied by: Self  Discharged To: Home     An independent Double Check of chemotherapy/biotherapy/immunotherapy of all medications in the treatment plan for this date of service was performed to include: verification of provider order against drug label (drug, dose, route, patient name, MRN), height, weight and recalculation of body surface area (BSA). Dose, route and schedule of administration with chemotherapy reference text has also been verified. Dose being given is confirmed to be within recommended dose range and flush solution is compatible. See medication administration record for documentation of two competent verifiers.  Treatment Administration:  Medications   famotidine (PEPCID) injection 20 mg (has no administration in time range)   sodium chloride 0.9% infusion (0 mL IntraVENOUS Stopped 06/30/22 1415)   acetaminophen (TYLENOL) tablet 650 mg (650 mg Oral Given 06/30/22 0819)   ondansetron (ZOFRAN) injection 8 mg (8 mg IntraVENOUS Given 06/30/22 0819)   dexAMETHasone (DECADRON) 10 mg in sodium chloride 0.9 %  50 mL IVPB (0 mg IntraVENOUS Completed 06/30/22 0908)   famotidine (PEPCID) injection 20 mg (20 mg IntraVENOUS Given 06/30/22 0819)   amino acid 5 % IV bolus 1,000 mL (0 mL IntraVENOUS IV Stop 06/30/22 1333)   sodium chloride 0.9 % bolus 500 mL (0 mL IntraVENOUS Completed 06/30/22 1225)   octreotide (SANDOSTATIN LAR) depot injection 30 mg (30 mg IntraMUSCULAR Given 06/30/22 1410)

## 2022-07-01 ENCOUNTER — Ambulatory Visit (HOSPITAL_BASED_OUTPATIENT_CLINIC_OR_DEPARTMENT_OTHER): Payer: Medicare Other | Admitting: Hematology & Oncology

## 2022-07-28 NOTE — Patient Instructions (Signed)
INSTRUCTIONS:        _____________________________________________________________________    Your Team:      PHYSICIAN: Gregory Botta, MD PhD      NURSE PRACTITIONER: Sarah McGhee, NP       NURSE CASE MANAGER: Amari Zagal RN    Phone: [phone]      I may be unable to answer phone calls/voicemails immediately during clinic days, so if you have an urgent question please call our call center ([phone]) and they can page me.  You can also choose to speak to the triage nurse for urgent medical questions by selecting that option when you call me.     ADMINISTRATIVE ASSISTANT: Ana Nava-Lozano (Paperwork, obtaining imaging, scheduling)  Phone: [phone]  Fax: [phone]    PATIENT NAVIGATOR: Hamza Ahmed   Phone: [phone]     *IF you are receiving infusions, please check with the infusion center for a code to enable you to receive free parking on infusion days. Please call [phone] Option 2, or stop at the front desk on your way out. These codes change monthly*                                                                                                                                                 If you have a medical emergency or life threatening situaton, please call 911 or go to the nearest Emergency Room.    AFTER-HOURS CARE:    If you develop a fever of 100.4F or greater after hours (On the weekends and/or Monday through Friday before 8:00am or after 4:30pm) please call [phone] and ask to speak with the On-Call Oncologist. Please also call the On-Call Oncologist for any urgent symptoms that happen after normal business hours.    AFTER HOURS EMERGENCY NUMBER [phone]                       Ask for the Oncology Physician On-Call     The following link is a video that will help explain choosing a healthcare proxy and the process of advanced care planning.   https://www.emmisolutions.com/program-preview-who-speaks-for-you/    [facility] Phone List for Patients      Clinic Hours of Operation: Monday-Friday 8:00- 4:30 p.m. (closed holidays and weekends)   Infusion Center Hours: Monday-Friday 7:00-9:30 p.m.; Sat-Sun 8:00-4:30pm.     Social Worker:   GI cancers: Leah Kahn-Plavnick, LCSW, [phone]  Pancreas cancer: Holly Anguiano, LCSW, [phone]  For emotional, social, spiritual and practical needs that may arise throughout cancer treatment and recovery.    Information Desk/Call Center: [phone]   ** General information: directions, telephone numbers, or available services     [facility]: [phone], option 2  ** Call to schedule, cancel, or reschedule chemotherapy appointments   [facility] Scheduling: [phone]  [facility] Scheduling: [phone]    Radiation Oncology: [phone]   ** Call to schedule, cancel, or reschedule radiation appointments     Financial Counselor: Marisela Villanueva [phone]   [email or web address]   [phone] is the centralized billing and insurance information number with a team of knowledgeable representatives ready to help M-F 9am to 6pm.     Registration: [id number] (to update personal information)    [facility]:   Ph:[phone]  Fax:[phone]  Open M-F 9-6pm    Radiology Scheduling: [phone]   For PET scans: [phone]    Medical Records: [phone]  Fax: [phone]    Chemotherapy Online Education:  Link to recorded Chemotherapy & Immunotherapy Education Class: https://www.youtube.com/watch?v=q3hO1-[email or web address]&feature=emb_title     Patient and Family Resource Center:  The Patient and Family Resource Center (PFRC) offers: The most up-to-date cancer information for patients, families, and the community. Informative brochures, videos, and books to help you learn about your cancer diagnosis, treatment options, managing symptoms and side effects, clinical trials, nutrition, coping, caregiving, and  self-care. Computers with Internet access to the most comprehensive and credible websites for cancer information and community resources. Various comfort items such as lap blankets, pillows, hats, and wigs.  Open daily; hours vary   Location: [facility], Room 1066, on the first floor just to the left of the lobby elevators  Phone [phone]     *If you get a bill from a molecular testing company ([employer], [employer], [employer], [employer], [employer], [employer]) please let us know before you pay. Our staff can help resolve this.

## 2022-07-29 ENCOUNTER — Ambulatory Visit
Admission: RE | Admit: 2022-07-29 | Discharge: 2022-07-29 | Disposition: A | Discharge status: Home / Self Care | Payer: Medicare Other | Attending: Hematology & Oncology | Admitting: Hematology & Oncology

## 2022-07-29 ENCOUNTER — Ambulatory Visit: Payer: Medicare Other | Admitting: Hematology & Oncology

## 2022-07-29 VITALS — BP 128/85 | HR 92 | Temp 97.4°F | Resp 16 | Ht 65.0 in | Wt 153.8 lb

## 2022-07-29 DIAGNOSIS — D3A8 Other benign neuroendocrine tumors: Secondary | ICD-10-CM | POA: Insufficient documentation

## 2022-07-29 DIAGNOSIS — C7A8 Other malignant neuroendocrine tumors: Secondary | ICD-10-CM

## 2022-07-29 DIAGNOSIS — E340 Carcinoid syndrome: Secondary | ICD-10-CM | POA: Insufficient documentation

## 2022-07-29 DIAGNOSIS — C254 Malignant neoplasm of endocrine pancreas: Secondary | ICD-10-CM

## 2022-07-29 DIAGNOSIS — Z79899 Other long term (current) drug therapy: Secondary | ICD-10-CM

## 2022-07-29 DIAGNOSIS — C7B8 Other secondary neuroendocrine tumors: Secondary | ICD-10-CM | POA: Insufficient documentation

## 2022-07-29 DIAGNOSIS — Z5181 Encounter for therapeutic drug level monitoring: Secondary | ICD-10-CM

## 2022-07-29 DIAGNOSIS — E875 Hyperkalemia: Secondary | ICD-10-CM

## 2022-07-29 DIAGNOSIS — Z09 Encounter for follow-up examination after completed treatment for conditions other than malignant neoplasm: Secondary | ICD-10-CM

## 2022-07-29 DIAGNOSIS — K8681 Exocrine pancreatic insufficiency: Secondary | ICD-10-CM

## 2022-07-29 DIAGNOSIS — C787 Secondary malignant neoplasm of liver and intrahepatic bile duct: Secondary | ICD-10-CM | POA: Insufficient documentation

## 2022-07-29 LAB — CBC WITH DIFF, BLOOD
ANC-Automated: 4.6 10*3/uL (ref 1.6–7.0)
ANC-Instrument: 4.6 10*3/uL (ref 1.6–7.0)
Abs Basophils: 0 10*3/uL (ref <0.2)
Abs Eosinophils: 0.1 10*3/uL (ref 0.0–0.5)
Abs Lymphs: 0.8 10*3/uL (ref 0.8–3.1)
Abs Monos: 0.7 10*3/uL (ref 0.2–0.8)
Basophils: 0 %
Eosinophils: 2 %
Hct: 37.8 % (ref 34.0–45.0)
Hgb: 13.4 gm/dL (ref 11.2–15.7)
Lymphocytes: 13 %
MCH: 30.9 pg (ref 26.0–32.0)
MCHC: 35.4 g/dL (ref 32.0–36.0)
MCV: 87.1 um3 (ref 79.0–95.0)
MPV: 9.6 fL (ref 9.4–12.4)
Monocytes: 11 %
Plt Count: 163 10*3/uL (ref 140–370)
RBC: 4.34 10*6/uL (ref 3.90–5.20)
RDW: 13.2 % (ref 12.0–14.0)
Segs: 74 %
WBC: 6.2 10*3/uL (ref 4.0–10.0)

## 2022-07-29 LAB — COMPREHENSIVE METABOLIC PANEL, BLOOD
ALT (SGPT): 42 U/L — ABNORMAL HIGH (ref 0–33)
AST (SGOT): 33 U/L — ABNORMAL HIGH (ref 0–32)
Albumin: 4.6 g/dL (ref 3.5–5.2)
Alkaline Phos: 199 U/L — ABNORMAL HIGH (ref 40–130)
Anion Gap: 12 mmol/L (ref 7–15)
BUN: 12 mg/dL (ref 8–23)
Bicarbonate: 26 mmol/L (ref 22–29)
Bilirubin, Tot: 0.45 mg/dL (ref <1.2)
Calcium: 9.6 mg/dL (ref 8.5–10.6)
Chloride: 102 mmol/L (ref 98–107)
Creatinine: 0.49 mg/dL — ABNORMAL LOW (ref 0.51–0.95)
Glucose: 103 mg/dL — ABNORMAL HIGH (ref 70–99)
Potassium: 4 mmol/L (ref 3.5–5.1)
Sodium: 140 mmol/L (ref 136–145)
Total Protein: 7.8 g/dL (ref 6.0–8.0)
eGFR Based on CKD-EPI 2021 Equation: >60 mL/min/{1.73_m2}

## 2022-07-29 LAB — CEA TUMOR, BLOOD: CEA Tumor: 22.5 ng/mL

## 2022-07-29 MED ORDER — OCTREOTIDE ACETATE 30 MG IM KIT
30.00 mg | PACK | Freq: Once | INTRAMUSCULAR | Status: AC
Start: 2022-07-29 — End: 2022-07-29
  Administered 2022-07-29: 30 mg via INTRAMUSCULAR
  Filled 2022-07-29: qty 1

## 2022-07-29 NOTE — Interdisciplinary (Signed)
Nurses Note: Patient in for Octreotide injection and labs.  Vital signs stable and in no apparent distress.  Pt reports no nausea, vomiting, constipation, or diarrhea.  Pt given ongoing medication teaching.    Octreotide '30mg'$  given IM to RIGHT GLUTEUS.  Patient tolerated injection.      Peripheral stick to: LEFT HAND On 1st attempt using 23g kit and aseptic technique.    Labs drawn.    Needle removed intact. Site benign. Gauze and pressure dressing placed.    Patient tolerated well and discharged ambulatory in stable condition to lobby awaiting lab results.    Paperwork given to charge RN to review lab results and assign patient to infusion.     Medications   octreotide (SANDOSTATIN LAR) depot injection 30 mg (30 mg IntraMUSCULAR Given 07/29/22 1216)                        .

## 2022-07-29 NOTE — Progress Notes (Signed)
GI Oncology Progress    Name: Jenna Bridges  DOB: October 04, 1955  Age: 67 year old  Sex: female  MRN: [id number]  Date of Service: 07/29/2022    Chief Complaint: PNET    HPI: Jenna Bridges is a 67 year old female presenting with metastatic PNET (Ki67 of 3%) on Octreotide '30mg'$  monthly until 04/06/2019 when interval scans showed progression per last scan at [facility] transferring all care to [place] with progression on PRRT.     History of PNET following with Dr. Veverly Fells at [place] on octreotide since 08/2017. Previous to this noted watery diarrhea and had increase from '20mg'$  to '30mg'$  with good resolution. Has had no abdominal pain or carcinoid symptoms. Recent CT CAP and MRI of pancreas October 2020 found new hepatic lesions and slight interval growth compared with 1 year prior.    She is here to discuss additional treatment options for her and overall prognosis.     05/22/2020: presents alone. States that the [facility] oncology practice is 'closing' and that she wants to transfer care to [place]. Has report from October that says there is progression on her MRI scans but they continued on octreotide '30mg'$  as she was clinically well. Otherwise notes diarrhea if her injection is delayed. Is set to get last injection at [facility] on 05/28/2020 then will need 06/2020 injection from that point forward.     07/18/2020: presents alone. Recent ED visit (1/24) with abdominal pain on right abdomen subcostal, severe when lying down. Restaged at that time showing minor right pleural thickening above right hepatic disease. Scattered 51m nodules.No pain has returned, no carcinoid issues.    10/17/2020:  Presents alone. On monthly Sandostatin LAR, tolerating well. Denies flushing, abdominal pain, or diarrhea.    11/07/2020: presents alone today. Restaging scans completed with SD. No diarrhea, flushing, pain.    03/26/2021: presents alone today. Stress about scans but stable since 10/2020. OK with three times a year scans. No issues with carcinoid syndrome. Has RUQ pain  stable, believes from stress. Discussion on injection and depot with previous scar tissue.    05/29/2021: presents today alone.  Has no particular symptoms of carcinoid syndrome.  Her last scans done September 2022 show stable disease without any progression.    02/19/2022:   Denies flushing, diarrhea for the past several years (though these were presenting sx in Fall 2018). Reports concerns with injection of sandostatin - problems with needle breaking and was concerned that she wasn't getting the whole dose because she didn't have an injection site reaction. Also had an episode of significant bruising after last infusion with some continued discomfort to palpation. Last injection 8/15.  Denies fevers, chills, bony pain, abdominal distension, changes in BM, abdominal pain, n/v.     03/19/2022: presents today alone. She had PET Cu64 complete with disease in liver and pancreatic tail with T8 lesion. She has no symptoms and no back pain. A lot of family issues with husband celiac, daughter crohns, daughter in law with cervical dystonia. Would like to know options for treatment. Without diarrhea, flushing, pain.     05/27/2022: tolerated treatment well without issues. No vomiting but had nausea. Went through about 3 bottles of water. Had a syncopal episode at 10pm after getting off the couch and going to the restroom. Husband found her on the floor, no injuries but rib pain on the left. Noted she was next to neuropsych unit and could hear yelling next door. Otherwise tolerated therapy well and husband took her home without  issues.     07/29/2022: presents alone today. Feeling very well with PRRT and no issues with next round. Was prepared with hydration as well as food. Potassium was normal on recheck of labs and LFTs are stable, blood counts stable. Pending follow up PET Cu64     Review of systems: pain from left rib s/p fall improved, less anxiety with treatment completion and very talkative, all other systems reviewed and  are negative except for what is described in the history of present illness.    Oncologic Timeline:  09/09/2017-04/06/2019: Octreotide Qmonthly  04/27/2019: [facility]  03/2020: Per report, progression on MRI, continues on octreotide '30mg'$  monthly  05/22/2020: F/U Dr. Ane Payment  07/15/2020: ED visit with pain, CTA negative  07/18/2020: RV Dr. Ane Payment  2/20/222: MRI AP- hepatic lesions similar in size  10/17/2020: RV Dr Ane Payment  11/07/2020: RV Dr. Ane Payment  03/26/2021: RV  05/26/2021: RV  07/27/2020: RV with new staging  02/19/2022: RV with new staging  03/19/2022: plan to move to lutathera  05/05/2022: Lutathera D1  05/28/2022: Octreotide  06/30/2022: Lutathera D2    -----------------------------  Past Medical History:   Past Medical History:   Diagnosis Date    Liver metastases (CMS-HCC) 05/05/2019       Medications:   Current Outpatient Medications:     COVID-19 mRNA vaccine (PFIZER) 30 MCG/0.3ML injection, , Disp: , Rfl:     octreotide (SANDOSTATIN LAR) 30 MG injection, 30 mg by IntraMUSCULAR route every 28 days., Disp: , Rfl:   No current facility-administered medications for this visit.    Facility-Administered Medications Ordered in Other Visits:     octreotide (SANDOSTATIN LAR) depot injection 30 mg, 30 mg, IntraMUSCULAR, Once, Cilicia Borden, Toney Reil, MD, PhD    Allergies:   Allergies   Allergen Reactions    Valproic Acid Unspecified     Reaction(s): Unknown       Social history:   Alcohol: None  Tobacco: None  Drugs: None  Living Situation: Lives with husband, daughter is a PhD Personal assistant at [employer] history:  Father: Parkinson/Lewy Body Dementia  Cousins: breast, brain, colon cancer and lymphoma    -----------------------------  Physical exam:  Vitals:There were no vitals taken for this visit.  Wt Readings from Last 3 Encounters:   06/30/22 68.9 kg (151 lb 14.4 oz)   05/28/22 68.9 kg (151 lb 14.4 oz)   05/27/22 68.9 kg (152 lb)     PHYSICAL EXAM   (unchanged)  ECOG: 0  GENERAL APPEARANCE: The  patient is an alert, cooperative female in no acute distress.  SKIN: No lesions or rashes. No jaundice  EYES: PERRLA, EOMs are intact. No icterus.   EAR, NOSE, AND THROAT: The oropharynx is clear and moist.   LYMPH NODES: No appreciable observable lymphadenopathy  CHEST: The lungs are clear to auscultation. Normal respiratory effort  HEART: RRR S1 and S2 are normal.  There are no murmurs or extra sounds.  ABDOMEN:  The abdomen is soft and nontender. +BS. There is no hepatosplenomegaly. R buttock with some discomfort with palpation but no bruising.   EXTREMITES: There is no edema or cyanosis. Joints are normal without redness or swelling.  NEURO:  Mental status is normal.  No focal neurologic findings.           -----------------------------  Labs: Reviewed by myself  Lab Results   Component Value Date    WBC 5.1 06/29/2022    RBC 4.72 06/29/2022  HGB 14.3 06/29/2022    HCT 41.8 06/29/2022    MCV 88.6 06/29/2022    MCHC 34.2 06/29/2022    RDW 13.3 06/29/2022    PLT 205 06/29/2022    MPV 9.5 06/29/2022     Lab Results   Component Value Date    BUN 10 06/29/2022    CREAT 0.54 06/29/2022    CL 101 06/29/2022    NA 141 06/29/2022    K 5.2 (H) 06/30/2022    [place] 9.8 06/29/2022    TBILI 0.48 06/29/2022    ALB 4.7 06/29/2022    TP 8.1 (H) 06/29/2022    AST 28 06/29/2022    ALK 212 (H) 06/29/2022    BICARB 32 (H) 06/29/2022    ALT 41 (H) 06/29/2022    GLU 94 06/29/2022     Pancreatic Polypeptide 07/2017: 837  Chromogranin A 07/2017: 1  CA19-9 07/2017: 8  Alk Phos 11/2018: 176    Colonoscopy    Radiology: Reviewed by myself in conjunction with radiology report  Nuc Med Lutathera LU-177 Injection    Result Date: 06/30/2022  IMPRESSION: 192.2 (mCi) of Lutathera was administered     Nuc Med Lutathera LU-177 Injection    Result Date: 05/05/2022  IMPRESSION: 193 mCi of Lutathera was administered.      03/2020 [facility] Report      04/04/2019 MRI Pancreas    CT Chest 03/27/2019  - No evidence of thoracic metastatic disease    CT A/P  03/24/2019    PET Gallium 68  - Primary pancreatic mass with intense radiotracer uptake and multiple hepatic metastatic lesions    Pathology:    Ki67 3%    Molecular:    PDL1 0%      -----------------------------  Assessment & Plan:  Jenna Bridges is a 67 year old female presenting with metastatic PNET with progression on '30mg'$  Octreotide Monthly + Lutathera    We discussed with the patient and their family the natural course of NET. We discussed how many of these come from GI sources including the pancreas, colon, and stomach. Occasionally they are seen from the lung as well. We discussed that they can be indolent and slow growing or quickly capable of rapid growth and spread. Treatment options for symptom relief include somatostatin analogues. Although fewer than 10% of patients see tumor shrinkage with these drugs, prolonged stable disease is seen in 66% which delays progression.     We discussed that if there was interval growth on the next imaging further options include everolimus, CAPTEM, TKIs (Sunitinib, sorafenib, pazopanib, and cabozantinib) and Lutathera. We will also evaluate the ability to send tissue for NGS today with [facility]    Ultimately we would continue octreotide at this time and add everolimus. We discussed evaluating in 2 months to see if growth stopped or continued. After this we would then recommend either CAPTEM or Lutathera. We discussed the side effects dermatologically of everolimus and instructed the patient to pre-empt the rash with a good face hygiene regimen.     Would have an RV in January to discuss restaging scans.    05/22/2020: We will port her scans in from [employer] and restage now given 3 month interval. She previously had a PET GA68 to follow as well for [place]. We discussed increasing octreotide or adding regorafenib/everolimus in addition next. We will schedule her for her 06/2020 injection as well to reduce her diarrheal burden. RV monthly until stability. NGS completed as above and  added to  chart. 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 We discussed MEN can be used as a screening tool for future cancers and she will talk to them but doesn't want to make decision now.    07/18/2020: pain may be referred from hepatic disease, no obvious right effusion needing thoracentesis. Pain has resolved and no diarrheal symptoms. Would recommend quick interval scan in 2 months time, mid March. Continue injections of octreotide.    10/17/2020: No symptoms of carcinoid. Tolerating Sandostatin LAR without issues. Labs today (CBC CMP TSH Vit B12). Can use these labs for CT AP scheduled for 5/11. Continue Sandostatin injections and follow up after imaging.     11/07/2020: doing very well, scans show stability. No nutritional deficiencies seen on labs. Continue with therapy regularly each month. Restage in 3-6 months.    03/26/2021: continues to do very well. RUQ pain likely related to either liver capsule stretching or possible anxiety. Stable disease on scans without evidence of carcinoid syndrome. Again discussion on MEN1 and screening for pituitary and thyroid disorders and declines. Also discussed genetics counseling and family but declines as well. Will see her in 2 months with scans in 4 months.     05/29/2021:  Continuing to do very well on current dosage and schedule.  We will restage in 6 months' time.    02/19/2022: CT of chest, abdomen, and pelvis significant for interval increase in liver lesions as well as increased lymphadenopathy and increased sclerotic lesion on T8 vertebral body, but similar size of pancreatic tail mass. Discussed next steps including possible treatments such as everolimus, IR ablation, chemotherapy, and lutathera. Plan to continue octreotide and obtain a PET-copper scan to better characterize size and location of lesions. Will determine  additional treatment based on results of PET.     03/19/2022: at this time with PET Cu64 showing diffuse and progressive disease discussed multiple options for improved control. Discussed biopsy of newly growing area but patient declines at this time. Discussed moving to lanreotide but concern for definitive control, more likely slow growth over time. Does not wish to take a daily pill or oral chemotherapy. As such Lutathera may be a more appropriate route. It does not disrupt her day to day and allows her to continue with a more effective, targeted approach that appeals to her. Will have the Nuclear Medicine team reach out to her to schedule and work with them to align our octreotide schedule with her treatment.     05/27/2022: will continue with Lutathera every 8 weeks. Will have octreotide tomorrow [facility] at [facility]. Has weight gain and energy. Left rib pain likely from fall and rib pain and MSK bruising but explained it will take months to heal. Discussed at length her chronic treatment regimen is tiresome and its ok to feel stressed or tired or take care of herself over others.     07/29/2022: next PRRT on 08/25/2022 and again 10/20/2022. We will order her PET Cu64 thereafter. Continuing monitoring renal function and hyperkalemia fro mPRRT as well as LFTs. Carcinoid well controlled on interval octreotide. EKGs appropriate s/p PRRT.           ICD-10-CM ICD-9-CM   1. Primary pancreatic neuroendocrine tumor  D3A.8 209.69   2. Neuroendocrine carcinoma (CMS-HCC)  C7A.8 209.20   3. Post chemo evaluation  Z09 V67.2   4. Exocrine pancreatic  insufficiency  K86.81 577.8   5. Encounter for monitoring cardiotoxic drug therapy  Z51.81 V58.83    Z79.899 V58.69         Therapeutic consent is obtained with all patients prior to the first infusion visit or dose. We discuss alternative treatments, expected side effects, and rare occurrences. Further the patient and I discuss the expected outcomes as well as contact number for anyone on our  team.      I spent a total of 40 minutes on the day of the visit.    Margreta Journey MD/PhD      -----------------------------  Lab   No orders of the defined types were placed in this encounter.    Imaging   No orders of the defined types were placed in this encounter.    Procedures   No orders of the defined types were placed in this encounter.    Other No orders of the defined types were placed in this encounter.

## 2022-08-24 ENCOUNTER — Other Ambulatory Visit: Payer: Medicare Other | Attending: Hematology & Oncology

## 2022-08-24 ENCOUNTER — Telehealth (HOSPITAL_BASED_OUTPATIENT_CLINIC_OR_DEPARTMENT_OTHER): Payer: Self-pay | Admitting: Hematology & Oncology

## 2022-08-24 DIAGNOSIS — C7B8 Other secondary neuroendocrine tumors: Secondary | ICD-10-CM

## 2022-08-24 DIAGNOSIS — D3A8 Other benign neuroendocrine tumors: Secondary | ICD-10-CM

## 2022-08-24 DIAGNOSIS — C7A8 Other malignant neuroendocrine tumors: Secondary | ICD-10-CM | POA: Insufficient documentation

## 2022-08-24 DIAGNOSIS — T451X5A Adverse effect of antineoplastic and immunosuppressive drugs, initial encounter: Secondary | ICD-10-CM | POA: Insufficient documentation

## 2022-08-24 DIAGNOSIS — C254 Malignant neoplasm of endocrine pancreas: Secondary | ICD-10-CM | POA: Insufficient documentation

## 2022-08-24 DIAGNOSIS — R5383 Other fatigue: Secondary | ICD-10-CM | POA: Insufficient documentation

## 2022-08-24 DIAGNOSIS — Z79899 Other long term (current) drug therapy: Secondary | ICD-10-CM | POA: Insufficient documentation

## 2022-08-24 LAB — COMPREHENSIVE METABOLIC PANEL, BLOOD
ALT (SGPT): 37 U/L — ABNORMAL HIGH (ref 0–33)
AST (SGOT): 27 U/L (ref 0–32)
Albumin: 4.5 g/dL (ref 3.5–5.2)
Alkaline Phos: 192 U/L — ABNORMAL HIGH (ref 40–130)
Anion Gap: 8 mmol/L (ref 7–15)
BUN: 15 mg/dL (ref 8–23)
Bicarbonate: 30 mmol/L — ABNORMAL HIGH (ref 22–29)
Bilirubin, Tot: 0.43 mg/dL (ref <1.2)
Calcium: 9.5 mg/dL (ref 8.5–10.6)
Chloride: 103 mmol/L (ref 98–107)
Creatinine: 0.54 mg/dL (ref 0.51–0.95)
Glucose: 110 mg/dL — ABNORMAL HIGH (ref 70–99)
Potassium: 4.5 mmol/L (ref 3.5–5.1)
Sodium: 141 mmol/L (ref 136–145)
Total Protein: 7.5 g/dL (ref 6.0–8.0)
eGFR Based on CKD-EPI 2021 Equation: >60 mL/min/{1.73_m2}

## 2022-08-24 LAB — CBC WITH DIFF, BLOOD
ANC-Automated: 3.3 10*3/uL (ref 1.6–7.0)
ANC-Instrument: 3.3 10*3/uL (ref 1.6–7.0)
Abs Basophils: 0 10*3/uL (ref <0.2)
Abs Eosinophils: 0.1 10*3/uL (ref 0.0–0.5)
Abs Lymphs: 1 10*3/uL (ref 0.8–3.1)
Abs Monos: 0.6 10*3/uL (ref 0.2–0.8)
Basophils: 0 %
Eosinophils: 1 %
Hct: 38.2 % (ref 34.0–45.0)
Hgb: 13.3 gm/dL (ref 11.2–15.7)
Lymphocytes: 21 %
MCH: 31 pg (ref 26.0–32.0)
MCHC: 34.8 g/dL (ref 32.0–36.0)
MCV: 89 um3 (ref 79.0–95.0)
MPV: 9.6 fL (ref 9.4–12.4)
Monocytes: 12 %
Plt Count: 163 10*3/uL (ref 140–370)
RBC: 4.29 10*6/uL (ref 3.90–5.20)
RDW: 13.3 % (ref 12.0–14.0)
Segs: 66 %
WBC: 5 10*3/uL (ref 4.0–10.0)

## 2022-08-24 LAB — HCG QUANTITATIVE, BLOOD: HCG Qt: 4 m[IU]/mL

## 2022-08-24 NOTE — Telephone Encounter (Signed)
Left msg at [phone] to remind patient to get CBC & CMP blood panels done today for treatment date 08/25/22. Any questions, I can be reached at [phone].

## 2022-08-25 ENCOUNTER — Ambulatory Visit
Admission: RE | Admit: 2022-08-25 | Discharge: 2022-08-25 | Disposition: A | Discharge status: Home / Self Care | Payer: Medicare Other | Attending: Hematology & Oncology | Admitting: Hematology & Oncology

## 2022-08-25 ENCOUNTER — Ambulatory Visit
Admission: RE | Admit: 2022-08-25 | Discharge: 2022-08-25 | Disposition: A | Discharge status: Home / Self Care | Payer: Medicare Other | Attending: Nuclear Medicine | Admitting: Nuclear Medicine

## 2022-08-25 VITALS — BP 135/75 | HR 76 | Temp 97.1°F | Resp 18 | Ht 65.0 in

## 2022-08-25 DIAGNOSIS — C254 Malignant neoplasm of endocrine pancreas: Secondary | ICD-10-CM | POA: Insufficient documentation

## 2022-08-25 DIAGNOSIS — D3A8 Other benign neuroendocrine tumors: Secondary | ICD-10-CM | POA: Insufficient documentation

## 2022-08-25 LAB — POTASSIUM, BLOOD: Potassium: 4.7 mmol/L (ref 3.5–5.1)

## 2022-08-25 MED ORDER — ACETAMINOPHEN 325 MG PO TABS
650.0000 mg | ORAL_TABLET | Freq: Once | ORAL | Status: AC
Start: 2022-08-25 — End: 2022-08-25
  Administered 2022-08-25: 650 mg via ORAL
  Filled 2022-08-25: qty 2

## 2022-08-25 MED ORDER — ONDANSETRON HCL 4 MG/2ML IV SOLN
8.0000 mg | Freq: Once | INTRAMUSCULAR | Status: AC
Start: 2022-08-25 — End: 2022-08-25
  Administered 2022-08-25: 8 mg via INTRAVENOUS
  Filled 2022-08-25: qty 4

## 2022-08-25 MED ORDER — OCTREOTIDE ACETATE 30 MG IM KIT
30.0000 mg | PACK | Freq: Once | INTRAMUSCULAR | Status: AC
Start: 2022-08-25 — End: 2022-08-25
  Administered 2022-08-25: 30 mg via INTRAMUSCULAR
  Filled 2022-08-25: qty 1

## 2022-08-25 MED ORDER — AMINO ACID 5 % IV SOLN
1000.0000 mL | Freq: Once | INTRAVENOUS | Status: AC
Start: 2022-08-25 — End: 2022-08-25
  Administered 2022-08-25: 1000 mL via INTRAVENOUS
  Filled 2022-08-25: qty 1000

## 2022-08-25 MED ORDER — LUTETIUM LU 177 DOTATATE 370 MBQ/ML IV SOLN
193.0600 | Freq: Once | INTRAVENOUS | Status: AC
Start: 2022-08-25 — End: 2022-08-25
  Administered 2022-08-25: 193.06 via INTRAVENOUS
  Filled 2022-08-25: qty 194

## 2022-08-25 MED ORDER — FAMOTIDINE (PF) 20 MG/2ML IV SOLN
20.0000 mg | Freq: Once | INTRAVENOUS | Status: AC
Start: 2022-08-25 — End: 2022-08-25
  Administered 2022-08-25: 20 mg via INTRAVENOUS
  Filled 2022-08-25: qty 2

## 2022-08-25 MED ORDER — SODIUM CHLORIDE 0.9 % IV SOLN
10.0000 mg | Freq: Once | INTRAVENOUS | Status: AC
Start: 2022-08-25 — End: 2022-08-25
  Administered 2022-08-25: 10 mg via INTRAVENOUS
  Filled 2022-08-25: qty 1

## 2022-08-25 MED ORDER — SODIUM CHLORIDE 0.9 % IV SOLN
Freq: Once | INTRAVENOUS | Status: AC
Start: 2022-08-25 — End: 2022-08-25

## 2022-08-25 NOTE — Interdisciplinary (Signed)
Chemotherapy Nursing Note - [place]    Jenna Bridges is a 67 year old female who presents for chemotherapy cycle 3, day(s) 1 of LUTATHERA #3.    Current Chemotherapy Signed Informed Consent verified/obtained yes    Pre-treatment nursing assessment:  No problems identified upon assessment. Patient has no new complaints, came accompanied by NM, Gerald Stabs. Two PIVs placed to bilateral hands, 24G, +BR. Labs reviewed and patient meets parameters.     ECOG    Not Applicable    PHQ2  PHQ2 Total: 0  Not Applicable    Chemotherapy:  Medications   sodium chloride 0.9% infusion (0 mL IntraVENOUS Stopped 08/25/22 1420)   acetaminophen (TYLENOL) tablet 650 mg (650 mg Oral Given 08/25/22 0903)   ondansetron (ZOFRAN) injection 8 mg (8 mg IntraVENOUS Given 08/25/22 0903)   dexAMETHasone (DECADRON) 10 mg in sodium chloride 0.9 % 50 mL IVPB (0 mg IntraVENOUS Completed 08/25/22 0925)   famotidine (PEPCID) injection 20 mg (20 mg IntraVENOUS Given 08/25/22 0903)   amino acid 5 % IV bolus 1,000 mL (0 mL IntraVENOUS IV Stop 08/25/22 1351)   octreotide (SANDOSTATIN LAR) depot injection 30 mg (30 mg IntraMUSCULAR Given 08/25/22 1446)     Potassium redraw 4.7, no intervention needed at this time.   Sandostatin administered in left glute, no issues noted.   Next sandostatin injection appt requested for 4/2, patient aware.   Bilateral hand PIVs removed, dressings secured.  NM cleared patient to dc home.     Patient Education  Learner: Patient  Barriers to learning: No Barriers  Readiness to learn: Acceptance  Method: Explanation    Treatment Education:       Signs and symptoms of infection, bleeding, adverse reaction(s), symptom control, and when to notify MD.    Patient instructed on post-treatment care and precautions specific to drug(s) administered.   If applicable, education given on vesicant administration risk and signs and symptoms of extravasation (redness, pain, swelling, pressure at IV site) to report immediately to the chemotherapy RN.     Fall Prevention Education: Instructed patient to call for assistance, call light within reach.   Pain Education: Patient instructed to contact nurse if pain should develop or if their current pain therapy becomes ineffective.       Treatment education provided to patient: yes    Response: Verbalizes understanding    Discharge Plan  Discharge instructions given to patient.  Future appointments:date given and reviewed with treatment plan, 4/30 - pt aware.  Discharge Mode: Ambulatory   Accompanied by: Self  Discharged To: Home

## 2022-08-31 NOTE — Addendum Note (Signed)
Encounter addended by: Molly Maduro on: 08/31/2022 4:10 PM   Actions taken: Flowsheet accepted, Charge Capture section accepted

## 2022-09-16 ENCOUNTER — Telehealth (HOSPITAL_BASED_OUTPATIENT_CLINIC_OR_DEPARTMENT_OTHER): Payer: Self-pay

## 2022-09-16 ENCOUNTER — Encounter (HOSPITAL_BASED_OUTPATIENT_CLINIC_OR_DEPARTMENT_OTHER): Payer: Self-pay | Admitting: Hematology & Oncology

## 2022-09-16 DIAGNOSIS — D3A8 Other benign neuroendocrine tumors: Secondary | ICD-10-CM

## 2022-09-16 NOTE — Telephone Encounter (Signed)
TC to patient following up from voicemail left today.    Concerns: schedule change and amoxicillin    Patient cancelled 3/28 appointment with Dr. Ane Payment and re-scheduled to 4/3. Dr. Ane Payment aware.  Patient having dental procedure done 3/28. Dentist prescribed prophylactic amoxicillin. Instructed patient to go ahead and take amoxicillin as prescribed from Dentist and explained there are no contraindications with current treatment plan.    Patient expressed understanding of plan and will follow-up in clinic.

## 2022-09-17 ENCOUNTER — Telehealth (HOSPITAL_BASED_OUTPATIENT_CLINIC_OR_DEPARTMENT_OTHER): Payer: Self-pay

## 2022-09-17 ENCOUNTER — Ambulatory Visit (HOSPITAL_BASED_OUTPATIENT_CLINIC_OR_DEPARTMENT_OTHER): Payer: Medicare Other | Admitting: Hematology & Oncology

## 2022-09-17 NOTE — Telephone Encounter (Signed)
Dr. Ane Payment,     Randal Buba is scheduled for Sandostatin LAR on 4/2.     Please enter more orders; signed orders are required to avoid cancellation of appointment.   If orders remain unsigned, the patient will be contacted 24 hours prior to their scheduled appointment notifying them of cancellation. The infusion appointment will then be rescheduled after orders are signed.     Thank you,   Sherlyn Lick

## 2022-09-22 ENCOUNTER — Ambulatory Visit (HOSPITAL_BASED_OUTPATIENT_CLINIC_OR_DEPARTMENT_OTHER): Payer: Medicare Other

## 2022-09-22 ENCOUNTER — Ambulatory Visit
Admission: RE | Admit: 2022-09-22 | Discharge: 2022-09-22 | Disposition: A | Discharge status: Home / Self Care | Payer: Medicare Other | Attending: Nurse Practitioner | Admitting: Nurse Practitioner

## 2022-09-22 VITALS — BP 156/76 | HR 84 | Temp 97.5°F | Resp 17 | Ht 65.16 in | Wt 153.7 lb

## 2022-09-22 DIAGNOSIS — C787 Secondary malignant neoplasm of liver and intrahepatic bile duct: Secondary | ICD-10-CM | POA: Insufficient documentation

## 2022-09-22 DIAGNOSIS — C254 Malignant neoplasm of endocrine pancreas: Secondary | ICD-10-CM | POA: Insufficient documentation

## 2022-09-22 DIAGNOSIS — E340 Carcinoid syndrome: Secondary | ICD-10-CM | POA: Insufficient documentation

## 2022-09-22 MED ORDER — OCTREOTIDE ACETATE 30 MG IM KIT
30.00 mg | PACK | Freq: Once | INTRAMUSCULAR | Status: AC
Start: 2022-09-22 — End: 2022-09-22
  Administered 2022-09-22: 30 mg via INTRAMUSCULAR
  Filled 2022-09-22: qty 1

## 2022-09-22 NOTE — Interdisciplinary (Signed)
Pre-administration assessment of patient completed by Minette Brine, RN.    BP 156/76 (BP Location: Left arm, BP Patient Position: Sitting)   Pulse 84   Temp 97.5 F (36.4 C) (Temporal)   Resp 17   Ht 5' 5.16" (1.655 m)   Wt 69.7 kg (153 lb 10.6 oz)   SpO2 99%   BMI 25.45 kg/m        30 mg SANDOSTATIN given IM to right upper quad. gluteus using aseptic technique.        Patient tolerated injection without adverse effects. Site benign. Band-aid placed.      Patient discharged to home in no apparent distress.   Discharge mode: Ambulatory   Return to clinic in 4 week(s) for SANDOSTATIN.   date given and reviewed with treatment plan.    Medications   octreotide (SANDOSTATIN LAR) depot injection 30 mg (30 mg IntraMUSCULAR Given 09/22/22 1252)     Jenna Bridges, LVN  09/22/22

## 2022-09-23 ENCOUNTER — Ambulatory Visit: Payer: Medicare Other | Attending: Hematology & Oncology | Admitting: Hematology & Oncology

## 2022-09-23 VITALS — BP 138/81 | HR 79 | Temp 98.8°F | Resp 16 | Ht 65.16 in | Wt 153.0 lb

## 2022-09-23 DIAGNOSIS — C7A8 Other malignant neuroendocrine tumors: Secondary | ICD-10-CM | POA: Insufficient documentation

## 2022-09-23 DIAGNOSIS — K8681 Exocrine pancreatic insufficiency: Secondary | ICD-10-CM | POA: Insufficient documentation

## 2022-09-23 DIAGNOSIS — E875 Hyperkalemia: Secondary | ICD-10-CM | POA: Insufficient documentation

## 2022-09-23 DIAGNOSIS — C254 Malignant neoplasm of endocrine pancreas: Secondary | ICD-10-CM | POA: Insufficient documentation

## 2022-09-23 DIAGNOSIS — C7B8 Other secondary neuroendocrine tumors: Secondary | ICD-10-CM | POA: Insufficient documentation

## 2022-09-23 DIAGNOSIS — E340 Carcinoid syndrome: Secondary | ICD-10-CM | POA: Insufficient documentation

## 2022-09-23 DIAGNOSIS — Z09 Encounter for follow-up examination after completed treatment for conditions other than malignant neoplasm: Secondary | ICD-10-CM | POA: Insufficient documentation

## 2022-09-23 DIAGNOSIS — D3A8 Other benign neuroendocrine tumors: Secondary | ICD-10-CM | POA: Insufficient documentation

## 2022-09-23 NOTE — Patient Instructions (Signed)
INSTRUCTIONS:        _____________________________________________________________________    Your Team:      PHYSICIAN: Gregory Botta, MD PhD      NURSE PRACTITIONER: Sarah McGhee, NP       NURSE CASE MANAGER: Laban Orourke RN    Phone: [phone]      I may be unable to answer phone calls/voicemails immediately during clinic days, so if you have an urgent question please call our call center ([phone]) and they can page me.  You can also choose to speak to the triage nurse for urgent medical questions by selecting that option when you call me.     ADMINISTRATIVE ASSISTANT: Ana Nava-Lozano (Paperwork, obtaining imaging, scheduling)  Phone: [phone]  Fax: [phone]    PATIENT NAVIGATOR: Hamza Ahmed   Phone: [phone]     *IF you are receiving infusions, please check with the infusion center for a code to enable you to receive free parking on infusion days. Please call [phone] Option 2, or stop at the front desk on your way out. These codes change monthly*                                                                                                                                                 If you have a medical emergency or life threatening situaton, please call 911 or go to the nearest Emergency Room.    AFTER-HOURS CARE:    If you develop a fever of 100.4F or greater after hours (On the weekends and/or Monday through Friday before 8:00am or after 4:30pm) please call [phone] and ask to speak with the On-Call Oncologist. Please also call the On-Call Oncologist for any urgent symptoms that happen after normal business hours.    AFTER HOURS EMERGENCY NUMBER [phone]                       Ask for the Oncology Physician On-Call     The following link is a video that will help explain choosing a healthcare proxy and the process of advanced care planning.   https://www.emmisolutions.com/program-preview-who-speaks-for-you/    [facility] Phone List for Patients      Clinic Hours of Operation: Monday-Friday 8:00- 4:30 p.m. (closed holidays and weekends)   Infusion Center Hours: Monday-Friday 7:00-9:30 p.m.; Sat-Sun 8:00-4:30pm.     Social Worker:   GI cancers: Leah Kahn-Plavnick, LCSW, [phone]  Pancreas cancer: Holly Anguiano, LCSW, [phone]  For emotional, social, spiritual and practical needs that may arise throughout cancer treatment and recovery.    Information Desk/Call Center: [phone]   ** General information: directions, telephone numbers, or available services     [facility]: [phone], option 2  ** Call to schedule, cancel, or reschedule chemotherapy appointments   [facility] Scheduling: [phone]  [facility] Scheduling: [phone]    Radiation Oncology: [phone]   ** Call to schedule, cancel, or reschedule radiation appointments     Financial Counselor: Marisela Villanueva [phone]   [email or web address]   [phone] is the centralized billing and insurance information number with a team of knowledgeable representatives ready to help M-F 9am to 6pm.     Registration: [id number] (to update personal information)    [facility]:   Ph:[phone]  Fax:[phone]  Open M-F 9-6pm    Radiology Scheduling: [phone]   For PET scans: [phone]    Medical Records: [phone]  Fax: [phone]    Chemotherapy Online Education:  Link to recorded Chemotherapy & Immunotherapy Education Class: https://www.youtube.com/watch?v=q3hO1-[email or web address]&feature=emb_title     [facility]:  The [facility] ([facility]) offers: The most up-to-date cancer information for patients, families, and the community. Informative brochures, videos, and books to help you learn about your cancer diagnosis, treatment options, managing symptoms and side effects, clinical trials, nutrition, coping, caregiving, and  self-care. Computers with Internet access to the most comprehensive and credible websites for cancer information and community resources. Various comfort items such as lap blankets, pillows, hats, and wigs.  Open daily; hours vary   Location: [facility], Room 1066, on the first floor just to the left of the lobby elevators  Phone [phone]     *If you get a bill from a molecular testing company ([employer], [employer], [employer], [employer], [employer], [employer]) please let us know before you pay. Our staff can help resolve this.

## 2022-09-28 NOTE — Addendum Note (Signed)
Encounter addended by: Wannetta Sender on: 09/28/2022 1:47 PM   Actions taken: Flowsheet accepted, Charge Capture section accepted

## 2022-10-12 NOTE — Progress Notes (Signed)
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     History of PNET following with Dr. Ranell Patrick at [place] on octreotide since 08/2017. Previous to this noted watery diarrhea and had increase from 20mg  to 30mg  with good resolution. Has had no abdominal pain or carcinoid symptoms. Recent CT CAP and MRI of pancreas October 2020 found new hepatic lesions and slight interval growth compared with 1 year prior.    She is here to discuss additional treatment options for her and overall prognosis.     05/22/2020: presents alone. States that the [facility] oncology practice is 'closing' and that she wants to transfer care to [place]. Has report from October that says there is progression on her MRI scans but they continued on octreotide 30mg  as she was clinically well. Otherwise notes diarrhea if her injection is delayed. Is set to get last injection at [facility] on 05/28/2020 then will need 06/2020 injection from that point forward.     07/18/2020: presents alone. Recent ED visit (1/24) with abdominal pain on right abdomen subcostal, severe when lying down. Restaged at that time showing minor right pleural thickening above right hepatic disease. Scattered 4mm nodules.No pain has returned, no carcinoid issues.    10/17/2020:  Presents alone. On monthly Sandostatin LAR, tolerating well. Denies flushing, abdominal pain, or diarrhea.    11/07/2020: presents alone today. Restaging scans completed with SD. No diarrhea, flushing, pain.    03/26/2021: presents alone today. Stress about scans but stable since 10/2020. OK with three times a year scans. No issues with carcinoid syndrome. Has RUQ pain  stable, believes from stress. Discussion on injection and depot with previous scar tissue.    05/29/2021: presents today alone.  Has no particular symptoms of carcinoid syndrome.  Her last scans done September 2022 show stable disease without any progression.    02/19/2022:   Denies flushing, diarrhea for the past several years (though these were presenting sx in Fall 2018). Reports concerns with injection of sandostatin - problems with needle breaking and was concerned that she wasn't getting the whole dose because she didn't have an injection site reaction. Also had an episode of significant bruising after last infusion with some continued discomfort to palpation. Last injection 8/15.  Denies fevers, chills, bony pain, abdominal distension, changes in BM, abdominal pain, n/v.     03/19/2022: presents today alone. She had PET Cu64 complete with disease in liver and pancreatic tail with T8 lesion. She has no symptoms and no back pain. A lot of family issues with husband celiac, daughter crohns, daughter in law with cervical dystonia. Would like to know options for treatment. Without diarrhea, flushing, pain.     05/27/2022: tolerated treatment well without issues. No vomiting but had nausea. Went through about 3 bottles of water. Had a syncopal episode at 10pm after getting off the couch and going to the restroom. Husband found her on the floor, no injuries but rib pain on the left. Noted she was next to neuropsych unit and could hear yelling next door. Otherwise tolerated therapy well and husband took her home without  issues.     07/29/2022: presents alone today. Feeling very well with PRRT and no issues with next round. Was prepared with hydration as well as food. Potassium was normal on recheck of labs and LFTs are stable, blood counts stable. Pending follow up PET Cu64     09/23/2022:  Patient presents today in-person.  She is tolerating the PRRT exceptionally well with no particular fatigue right upper quadrant pain or  hyperkalemia.  She is questions regarding restaging scans in steps.    Review of systems: pain from left rib s/p fall improved, less anxiety with treatment ongoing and very talkative, all other systems reviewed and are negative except for what is described in the history of present illness.    Oncologic Timeline:  09/09/2017-04/06/2019: Octreotide Qmonthly  04/27/2019: [facility] Consult  03/2020: Per report, progression on MRI, continues on octreotide  monthly  05/22/2020: F/U Dr. Art Buff  07/15/2020: ED visit with pain, CTA negative  07/18/2020: RV Dr. Art Buff  2/20/222: MRI AP- hepatic lesions similar in size  10/17/2020: RV Dr Art Buff  11/07/2020: RV Dr. Art Buff  03/26/2021: RV  05/26/2021: RV  07/27/2020: RV with new staging  02/19/2022: RV with new staging  03/19/2022: plan to move to lutathera  05/05/2022: Lutathera D1  05/28/2022: Octreotide  06/30/2022: Lutathera D2  10/20/2022: Lutathera C4    -----------------------------  Past Medical History:   Past Medical History:   Diagnosis Date    Liver metastases (CMS-HCC) 05/05/2019       Medications:   Current Outpatient Medications:     COVID-19 mRNA vaccine (PFIZER) 30 MCG/0.3ML injection, , Disp: , Rfl:     octreotide (SANDOSTATIN LAR) 30 MG injection, 30 mg by IntraMUSCULAR route every 28 days., Disp: , Rfl:     Allergies:   Allergies   Allergen Reactions    Valproic Acid Unspecified     Reaction(s): Unknown       Social history:   Alcohol: None  Tobacco: None  Drugs: None  Living Situation: Lives with husband, daughter is a PhD Chiropodist at [employer]    Family history:  Father: Parkinson/Lewy Body Dementia  Cousins: breast, brain, colon cancer and lymphoma    -----------------------------  Physical exam:  Vitals:BP 138/81 (BP Location: Left arm, BP Patient Position: Sitting, BP cuff size: Regular)   Pulse 79   Temp 98.8 F (37.1 C) (Temporal)   Resp 16   Ht 5' 5.16" (1.655 m)   Wt 69.4 kg (153 lb)   SpO2 99%   BMI 25.34 kg/m   Wt Readings from Last  3 Encounters:   09/23/22 69.4 kg (153 lb)   09/22/22 69.7 kg (153 lb 10.6 oz)   07/29/22 69.8 kg (153 lb 12.8 oz)     PHYSICAL EXAM   (unchanged)  ECOG: 0  GENERAL APPEARANCE: The patient is an alert, cooperative female in no acute distress.  SKIN: No lesions or rashes. No jaundice  EYES: PERRLA, EOMs are intact. No icterus.   EAR, NOSE, AND THROAT: The oropharynx is clear and moist.   LYMPH NODES: No appreciable observable lymphadenopathy  CHEST: The lungs are clear to auscultation. Normal respiratory effort  HEART: RRR S1 and S2 are normal.  There are no murmurs or extra sounds.  ABDOMEN:  The abdomen is soft and nontender. +BS. There is no hepatosplenomegaly. R buttock with some discomfort with palpation but no bruising.   EXTREMITES: There is no edema or cyanosis. Joints are normal without redness or swelling.  NEURO:  Mental status is normal.  No focal neurologic findings.           -----------------------------  Labs: Reviewed by myself  Lab Results   Component Value Date    WBC 5.0 08/24/2022    RBC 4.29 08/24/2022    HGB 13.3 08/24/2022    HCT 38.2 08/24/2022    MCV 89.0 08/24/2022    MCHC 34.8 08/24/2022    RDW 13.3 08/24/2022    PLT 163 08/24/2022    MPV 9.6 08/24/2022     Lab Results   Component Value Date    BUN 15 08/24/2022    CREAT 0.54 08/24/2022    CL 103 08/24/2022    NA 141 08/24/2022    K 4.7 08/25/2022    [place] 9.5 08/24/2022    TBILI 0.43 08/24/2022    ALB 4.5 08/24/2022    TP 7.5 08/24/2022    AST 27 08/24/2022    ALK 192 (H) 08/24/2022    BICARB 30 (H) 08/24/2022    ALT 37 (H) 08/24/2022    GLU 110 (H) 08/24/2022     Pancreatic Polypeptide 07/2017: 837  Chromogranin A 07/2017: 1  CA19-9 07/2017: 8  Alk Phos 11/2018: 176    Colonoscopy    Radiology: Reviewed by myself in conjunction with radiology report  Nuc Med Lutathera LU-177 Injection    Result Date: 08/25/2022  IMPRESSION: 193.06 (mCi) of Lutathera was administered.      03/2020 [facility] MRI Report      04/04/2019 MRI Pancreas    CT Chest  03/27/2019  - No evidence of thoracic metastatic disease    CT A/P 03/24/2019    PET Gallium 68  - Primary pancreatic mass with intense radiotracer uptake and multiple hepatic metastatic lesions    Pathology:    Ki67 3%    Molecular:    PDL1 0%      -----------------------------  Assessment & Plan:  Jenna Bridges is a 67 year old female presenting with metastatic PNET with progression on 30mg  Octreotide Monthly + Lutathera    We discussed with the patient and their family the natural course of NET. We discussed how many of these come from GI sources including the pancreas, colon, and stomach. Occasionally they are seen from the lung as well. We discussed that they can be indolent and slow growing or quickly capable of rapid growth and spread. Treatment options for symptom relief include somatostatin analogues. Although fewer than 10% of patients see tumor shrinkage with these drugs, prolonged stable disease is seen in 66% which delays progression.     We discussed that if there was interval growth on the next imaging further options include everolimus, CAPTEM, TKIs (Sunitinib, sorafenib, pazopanib, and cabozantinib) and Lutathera. We will also evaluate the ability to send tissue for NGS today with [facility]    Ultimately we would continue octreotide at this time and add everolimus. We discussed evaluating in 2 months to see if growth stopped or continued. After this we would then recommend either CAPTEM or Lutathera. We discussed the side effects dermatologically of everolimus and instructed the patient to pre-empt the rash with a good face hygiene regimen.     Would have an RV in January to discuss restaging scans.    05/22/2020: We will port her scans in from [facility] and restage now given 3 month interval. She previously had a PET GA68 to follow as well for Lutathera. We discussed increasing octreotide or adding regorafenib/everolimus in addition next. We  will schedule her for her 06/2020 injection as well to reduce her  diarrheal burden. RV monthly until stability. NGS completed as above and added to chart. 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 We discussed MEN can be used as a screening tool for future cancers and she will talk to them but doesn't want to make decision now.    07/18/2020: pain may be referred from hepatic disease, no obvious right effusion needing thoracentesis. Pain has resolved and no diarrheal symptoms. Would recommend quick interval scan in 2 months time, mid March. Continue injections of octreotide.    10/17/2020: No symptoms of carcinoid. Tolerating Sandostatin LAR without issues. Labs today (CBC CMP TSH Vit B12). Can use these labs for CT AP scheduled for 5/11. Continue Sandostatin injections and follow up after imaging.     11/07/2020: doing very well, scans show stability. No nutritional deficiencies seen on labs. Continue with therapy regularly each month. Restage in 3-6 months.    03/26/2021: continues to do very well. RUQ pain likely related to either liver capsule stretching or possible anxiety. Stable disease on scans without evidence of carcinoid syndrome. Again discussion on MEN1 and screening for pituitary and thyroid disorders and declines. Also discussed genetics counseling and family but declines as well. Will see her in 2 months with scans in 4 months.     05/29/2021:  Continuing to do very well on current dosage and schedule.  We will restage in 6 months' time.    02/19/2022: CT of chest, abdomen, and pelvis significant for interval increase in liver lesions as well as increased lymphadenopathy and increased sclerotic lesion on T8 vertebral body, but similar size of pancreatic tail mass. Discussed next steps including possible treatments such as everolimus, IR ablation, chemotherapy, and lutathera. Plan to continue octreotide and obtain a PET-copper  scan to better characterize size and location of lesions. Will determine additional treatment based on results of PET.     03/19/2022: at this time with PET Cu64 showing diffuse and progressive disease discussed multiple options for improved control. Discussed biopsy of newly growing area but patient declines at this time. Discussed moving to lanreotide but concern for definitive control, more likely slow growth over time. Does not wish to take a daily pill or oral chemotherapy. As such Lutathera may be a more appropriate route. It does not disrupt her day to day and allows her to continue with a more effective, targeted approach that appeals to her. Will have the Nuclear Medicine team reach out to her to schedule and work with them to align our octreotide schedule with her treatment.     05/27/2022: will continue with Lutathera every 8 weeks. Will have octreotide tomorrow here at [facility]. Has weight gain and energy. Left rib pain likely from fall and rib pain and MSK bruising but explained it will take months to heal. Discussed at length her chronic treatment regimen is tiresome and its ok to feel stressed or tired or take care of herself over others.     07/29/2022: next PRRT on 08/25/2022 and again 10/20/2022. We will order her PET Cu64 thereafter. Continuing monitoring renal function and hyperkalemia fro mPRRT as well as LFTs. Carcinoid well controlled on interval octreotide. EKGs appropriate s/p PRRT.     09/23/2022:  At this time we will sign off on her last PRRT and order  a PET copper to be completed 4 weeks after to permit octreotide washout.  She should not get octreotide prior to this injection as it will block the receptors needed for the imaging.  We will then have her return to discuss the next steps and necessity of additional PRRT, continue with octreotide alone, adding additional oral tablets, or any liver directed therapies.          ICD-10-CM ICD-9-CM   1. Malignant neoplasm of endocrine pancreas (CMS-HCC)   C25.4 157.4   2. Primary pancreatic neuroendocrine tumor  D3A.8 209.69   3. Neuroendocrine carcinoma (CMS-HCC)  C7A.8 209.20   4. Post chemo evaluation  Z09 V67.2   5. 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 Hyperkalemia  E87.5 276.7   7. 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 Carcinoid syndrome (CMS-HCC)  E34.0 259.2         Therapeutic consent is obtained with all patients prior to the first infusion visit or dose. We discuss alternative treatments, expected side effects, and rare occurrences. Further the patient and I discuss the expected outcomes as well as contact number for anyone on our team.      I spent a total of 30 minutes on the day of the visit.    Tressie Ellis MD/PhD      -----------------------------  Lab   Orders Placed This Encounter   Procedures    CEA Tumor - See Instructions     Imaging   No orders of the defined types were placed in this encounter.    Procedures   No orders of the defined types were placed in this encounter.    Other No orders of the defined types were placed in this encounter.

## 2022-10-19 ENCOUNTER — Other Ambulatory Visit: Payer: Medicare Other | Attending: Hematology & Oncology

## 2022-10-19 DIAGNOSIS — C7B8 Other secondary neuroendocrine tumors: Secondary | ICD-10-CM | POA: Insufficient documentation

## 2022-10-19 DIAGNOSIS — Z79899 Other long term (current) drug therapy: Secondary | ICD-10-CM | POA: Insufficient documentation

## 2022-10-19 DIAGNOSIS — E875 Hyperkalemia: Secondary | ICD-10-CM | POA: Insufficient documentation

## 2022-10-19 DIAGNOSIS — D3A8 Other benign neuroendocrine tumors: Secondary | ICD-10-CM | POA: Insufficient documentation

## 2022-10-19 DIAGNOSIS — C254 Malignant neoplasm of endocrine pancreas: Secondary | ICD-10-CM

## 2022-10-19 LAB — COMPREHENSIVE METABOLIC PANEL, BLOOD
ALT (SGPT): 31 U/L (ref 0–33)
AST (SGOT): 25 U/L (ref 0–32)
Albumin: 4.5 g/dL (ref 3.5–5.2)
Alkaline Phos: 161 U/L — ABNORMAL HIGH (ref 40–130)
Anion Gap: 10 mmol/L (ref 7–15)
BUN: 13 mg/dL (ref 8–23)
Bicarbonate: 28 mmol/L (ref 22–29)
Bilirubin, Tot: 0.39 mg/dL (ref <1.2)
Calcium: 9.1 mg/dL (ref 8.5–10.6)
Chloride: 104 mmol/L (ref 98–107)
Creatinine: 0.52 mg/dL (ref 0.51–0.95)
Glucose: 115 mg/dL — ABNORMAL HIGH (ref 70–99)
Potassium: 4.3 mmol/L (ref 3.5–5.1)
Sodium: 142 mmol/L (ref 136–145)
Total Protein: 7.3 g/dL (ref 6.0–8.0)
eGFR Based on CKD-EPI 2021 Equation: >60 mL/min/{1.73_m2}

## 2022-10-19 LAB — CBC WITH DIFF, BLOOD
ANC-Automated: 3.2 10*3/uL (ref 1.6–7.0)
ANC-Instrument: 3.2 10*3/uL (ref 1.6–7.0)
Abs Basophils: 0 10*3/uL (ref <0.2)
Abs Eosinophils: 0.1 10*3/uL (ref 0.0–0.5)
Abs Lymphs: 0.8 10*3/uL (ref 0.8–3.1)
Abs Monos: 0.6 10*3/uL (ref 0.2–0.8)
Basophils: 0.4 %
Eosinophils: 1.3 %
Hct: 34.4 % (ref 34.0–45.0)
Hgb: 12.2 gm/dL (ref 11.2–15.7)
Imm Gran %: 0.4 % (ref <1)
Lymphocytes: 17.8 %
MCH: 31.7 pg (ref 26.0–32.0)
MCHC: 35.5 g/dL (ref 32.0–36.0)
MCV: 89.4 um3 (ref 79.0–95.0)
MPV: 9.7 fL (ref 9.4–12.4)
Monocytes: 12.7 %
Plt Count: 154 10*3/uL (ref 140–370)
RBC: 3.85 10*6/uL — ABNORMAL LOW (ref 3.90–5.20)
RDW: 13.4 % (ref 12.0–14.0)
Segs: 67.4 %
WBC: 4.7 10*3/uL (ref 4.0–10.0)

## 2022-10-19 LAB — CEA TUMOR, BLOOD: CEA Tumor: 14.5 ng/mL

## 2022-10-19 LAB — HCG QUANTITATIVE, BLOOD: HCG Qt: 4 m[IU]/mL

## 2022-10-20 ENCOUNTER — Ambulatory Visit
Admission: RE | Admit: 2022-10-20 | Discharge: 2022-10-20 | Disposition: A | Discharge status: Home / Self Care | Payer: Medicare Other | Attending: In Vivo & In Vitro Nuclear Medicine | Admitting: In Vivo & In Vitro Nuclear Medicine

## 2022-10-20 ENCOUNTER — Ambulatory Visit
Admission: RE | Admit: 2022-10-20 | Discharge: 2022-10-20 | Disposition: A | Discharge status: Home / Self Care | Payer: Medicare Other | Attending: Hematology & Oncology | Admitting: Hematology & Oncology

## 2022-10-20 VITALS — BP 151/77 | HR 73 | Temp 97.8°F | Resp 16 | Ht 65.16 in | Wt 155.0 lb

## 2022-10-20 DIAGNOSIS — D3A8 Other benign neuroendocrine tumors: Secondary | ICD-10-CM | POA: Insufficient documentation

## 2022-10-20 DIAGNOSIS — C254 Malignant neoplasm of endocrine pancreas: Secondary | ICD-10-CM

## 2022-10-20 LAB — POTASSIUM, BLOOD: Potassium: 4 mmol/L (ref 3.5–5.1)

## 2022-10-20 MED ORDER — SODIUM CHLORIDE 0.9 % IV SOLN
10.0000 mg | Freq: Once | INTRAVENOUS | Status: AC
Start: 2022-10-20 — End: 2022-10-20
  Administered 2022-10-20: 10 mg via INTRAVENOUS
  Filled 2022-10-20: qty 1

## 2022-10-20 MED ORDER — FAMOTIDINE (PF) 20 MG/2ML IV SOLN
20.0000 mg | Freq: Once | INTRAVENOUS | Status: AC
Start: 2022-10-20 — End: 2022-10-20
  Administered 2022-10-20: 20 mg via INTRAVENOUS
  Filled 2022-10-20: qty 2

## 2022-10-20 MED ORDER — AMINO ACID 5 % IV SOLN
1000.0000 mL | Freq: Once | INTRAVENOUS | Status: AC
Start: 2022-10-20 — End: 2022-10-20
  Administered 2022-10-20: 1000 mL via INTRAVENOUS
  Filled 2022-10-20: qty 1000

## 2022-10-20 MED ORDER — FAMOTIDINE (PF) 20 MG/2ML IV SOLN
20.0000 mg | Freq: Once | INTRAVENOUS | Status: DC | PRN
Start: 2022-10-20 — End: 2022-10-20

## 2022-10-20 MED ORDER — ACETAMINOPHEN 325 MG PO TABS
650.0000 mg | ORAL_TABLET | Freq: Once | ORAL | Status: AC
Start: 2022-10-20 — End: 2022-10-20
  Administered 2022-10-20: 650 mg via ORAL
  Filled 2022-10-20: qty 2

## 2022-10-20 MED ORDER — ONDANSETRON HCL 4 MG/2ML IV SOLN
8.0000 mg | Freq: Once | INTRAMUSCULAR | Status: AC
Start: 2022-10-20 — End: 2022-10-20
  Administered 2022-10-20: 8 mg via INTRAVENOUS
  Filled 2022-10-20: qty 4

## 2022-10-20 MED ORDER — LUTETIUM LU 177 DOTATATE 370 MBQ/ML IV SOLN
194.9000 | Freq: Once | INTRAVENOUS | Status: AC
Start: 2022-10-20 — End: 2022-10-20
  Administered 2022-10-20: 194.9 via INTRAVENOUS
  Filled 2022-10-20: qty 195

## 2022-10-20 MED ORDER — OCTREOTIDE ACETATE 30 MG IM KIT
30.0000 mg | PACK | Freq: Once | INTRAMUSCULAR | Status: AC
Start: 2022-10-20 — End: 2022-10-20
  Administered 2022-10-20: 30 mg via INTRAMUSCULAR
  Filled 2022-10-20: qty 1

## 2022-10-20 MED ORDER — SODIUM CHLORIDE 0.9 % IV SOLN
Freq: Once | INTRAVENOUS | Status: AC
Start: 2022-10-20 — End: 2022-10-20

## 2022-10-20 NOTE — Interdisciplinary (Addendum)
Chemotherapy Nursing Note - [facility]    Jenna Bridges is a 67 year old female who presents for chemotherapy cycle 4, day(s) 1 of LUTATHERA.    Current Chemotherapy Signed Informed Consent verified/obtained yes    Pre-treatment nursing assessment:  Nuc Med RN Luanna Salk escorted patient to infusion chair. No problems identified upon assessment. No issue noted, and patient has no new complaints. 24g PIVs placed to right and left hand, +BR. Labs performed on 4/29 meet parameters for treatment.     ECOG    Not Applicable    PHQ2  PHQ2 Total: 1  Not Applicable    Chemotherapy:  Medications   famotidine (PEPCID) injection 20 mg (20 mg IntraVENOUS Not given 10/20/22 1200)   sodium chloride 0.9% infusion (0 mL IntraVENOUS Stopped 10/20/22 1305)   acetaminophen (TYLENOL) tablet 650 mg (650 mg Oral Given 10/20/22 0821)   ondansetron (ZOFRAN) injection 8 mg (8 mg IntraVENOUS Given 10/20/22 0822)   dexAMETHasone (DECADRON) 10 mg in sodium chloride 0.9 % 50 mL IVPB (0 mg IntraVENOUS Completed 10/20/22 0856)   famotidine (PEPCID) injection 20 mg (20 mg IntraVENOUS Given 10/20/22 0821)   amino acid 5 % IV bolus 1,000 mL (0 mL IntraVENOUS IV Stop 10/20/22 1305)   octreotide (SANDOSTATIN LAR) depot injection 30 mg (30 mg IntraMUSCULAR Given 10/20/22 1417)     Patient tolerated AA infusion without issue. Dc/d PIVs.  Potassium 4.0 today, no intervention needed.   Sandostatin given IM in left glute, no issues reported.   Patient walked out to lobby in no distress, last LUTA infusion as of now until patient f/u with MD.   Waiting on next sandostatin injection to be given until repeat scans can get scheduled and performed, per Dr. Art Buff.     Patient Education  Learner: Patient  Barriers to learning: No Barriers  Readiness to learn: Acceptance  Method: Explanation    Treatment Education:       Signs and symptoms of infection, bleeding, adverse reaction(s), symptom control, and when to notify MD.    Patient instructed on post-treatment care  and precautions specific to drug(s) administered.   If applicable, education given on vesicant administration risk and signs and symptoms of extravasation (redness, pain, swelling, pressure at IV site) to report immediately to the chemotherapy RN.    Fall Prevention Education: Instructed patient to call for assistance, call light within reach.   Pain Education: Patient instructed to contact nurse if pain should develop or if their current pain therapy becomes ineffective.       Treatment education provided to patient: yes    Response: Verbalizes understanding    Discharge Plan  Discharge instructions given to patient.  Future appointments:not needed, last LUTA appt until f/u with MD.  Discharge Mode: Ambulatory   Accompanied by: Self  Discharged To: Home

## 2022-10-26 NOTE — Addendum Note (Signed)
Encounter addended by: Ladoris Gene on: 10/26/2022 8:26 AM   Actions taken: Flowsheet accepted, Charge Capture section accepted

## 2022-10-29 ENCOUNTER — Telehealth (HOSPITAL_BASED_OUTPATIENT_CLINIC_OR_DEPARTMENT_OTHER): Payer: Self-pay

## 2022-10-29 NOTE — Telephone Encounter (Signed)
Outpatient Oncology RN Narrative    - Call received from patient, shares that she has been unable to schedule PET Cu as she has been on hold for too long, per last encounter with Dr. Art Buff 4/3, patient is to have this imaging done 4 weeks after last sandostatin injection, last injection was 4/30, will route to patient navigator to assist

## 2022-10-29 NOTE — Telephone Encounter (Signed)
Outpatient Oncology RN Narrative    - Patient scheduled for PET, message sent to front desk to call and schedule follow up with Dr. Art Buff

## 2022-11-18 ENCOUNTER — Telehealth (HOSPITAL_BASED_OUTPATIENT_CLINIC_OR_DEPARTMENT_OTHER): Payer: Self-pay

## 2022-11-18 NOTE — Telephone Encounter (Signed)
Outpatient Oncology RN Narrative    - Message received from patient inquiring if she should have another octreotide injection scheduled, notes reviewed, patient has a PET with copper scheduled on 5/31 and follow up with Dr. Art Buff on 6/6, advised to hold off on scheduling injection until he sees Dr. Art Buff, patient verbalized understanding

## 2022-11-19 ENCOUNTER — Ambulatory Visit (HOSPITAL_BASED_OUTPATIENT_CLINIC_OR_DEPARTMENT_OTHER): Payer: Medicare Other | Admitting: Hematology & Oncology

## 2022-11-20 ENCOUNTER — Ambulatory Visit
Admission: RE | Admit: 2022-11-20 | Discharge: 2022-11-20 | Disposition: A | Discharge status: Home / Self Care | Payer: Medicare Other | Attending: In Vivo & In Vitro Nuclear Medicine | Admitting: In Vivo & In Vitro Nuclear Medicine

## 2022-11-20 ENCOUNTER — Ambulatory Visit (HOSPITAL_BASED_OUTPATIENT_CLINIC_OR_DEPARTMENT_OTHER)
Admit: 2022-11-20 | Discharge: 2022-11-20 | Disposition: A | Discharge status: Home Health | Payer: Medicare Other | Attending: Hematology & Oncology | Admitting: Hematology & Oncology

## 2022-11-20 DIAGNOSIS — C7B8 Other secondary neuroendocrine tumors: Secondary | ICD-10-CM | POA: Insufficient documentation

## 2022-11-20 DIAGNOSIS — C7989 Secondary malignant neoplasm of other specified sites: Secondary | ICD-10-CM

## 2022-11-20 DIAGNOSIS — C7951 Secondary malignant neoplasm of bone: Secondary | ICD-10-CM

## 2022-11-20 DIAGNOSIS — D3A8 Other benign neuroendocrine tumors: Secondary | ICD-10-CM | POA: Insufficient documentation

## 2022-11-20 DIAGNOSIS — C254 Malignant neoplasm of endocrine pancreas: Secondary | ICD-10-CM

## 2022-11-20 DIAGNOSIS — C787 Secondary malignant neoplasm of liver and intrahepatic bile duct: Secondary | ICD-10-CM

## 2022-11-20 DIAGNOSIS — C7A8 Other malignant neuroendocrine tumors: Secondary | ICD-10-CM | POA: Insufficient documentation

## 2022-11-20 MED ORDER — COPPER CU 64 DOTATATE 1 MCI/ML IV SOLN
4.2300 | Freq: Once | INTRAVENOUS | Status: AC
Start: 2022-11-20 — End: 2022-11-20
  Administered 2022-11-20: 4.23 via INTRAVENOUS
  Filled 2022-11-20: qty 8

## 2022-11-26 ENCOUNTER — Ambulatory Visit (HOSPITAL_BASED_OUTPATIENT_CLINIC_OR_DEPARTMENT_OTHER): Payer: Medicare Other | Admitting: Hematology & Oncology

## 2022-11-26 ENCOUNTER — Encounter (HOSPITAL_BASED_OUTPATIENT_CLINIC_OR_DEPARTMENT_OTHER): Payer: Self-pay | Admitting: Hematology & Oncology

## 2022-11-26 ENCOUNTER — Ambulatory Visit
Admission: RE | Admit: 2022-11-26 | Discharge: 2022-11-26 | Disposition: A | Discharge status: Home / Self Care | Payer: Medicare Other | Attending: Hematology & Oncology | Admitting: Hematology & Oncology

## 2022-11-26 VITALS — BP 155/81 | HR 86 | Temp 98.2°F | Resp 16 | Ht 65.16 in | Wt 150.0 lb

## 2022-11-26 VITALS — BP 135/75 | HR 69 | Temp 97.9°F | Resp 17 | Ht 65.16 in | Wt 154.3 lb

## 2022-11-26 DIAGNOSIS — C254 Malignant neoplasm of endocrine pancreas: Secondary | ICD-10-CM | POA: Insufficient documentation

## 2022-11-26 DIAGNOSIS — C7B02 Secondary carcinoid tumors of liver: Secondary | ICD-10-CM

## 2022-11-26 DIAGNOSIS — E340 Carcinoid syndrome: Secondary | ICD-10-CM | POA: Insufficient documentation

## 2022-11-26 DIAGNOSIS — C787 Secondary malignant neoplasm of liver and intrahepatic bile duct: Secondary | ICD-10-CM | POA: Insufficient documentation

## 2022-11-26 DIAGNOSIS — D3A8 Other benign neuroendocrine tumors: Secondary | ICD-10-CM

## 2022-11-26 LAB — CBC WITH DIFF, BLOOD
ANC-Automated: 3.7 10*3/uL (ref 1.6–7.0)
ANC-Instrument: 3.7 10*3/uL (ref 1.6–7.0)
Abs Basophils: 0 10*3/uL (ref <0.2)
Abs Eosinophils: 0.1 10*3/uL (ref 0.0–0.5)
Abs Lymphs: 0.9 10*3/uL (ref 0.8–3.1)
Abs Monos: 0.5 10*3/uL (ref 0.2–0.8)
Basophils: 0.2 %
Eosinophils: 1 %
Hct: 35 % (ref 34.0–45.0)
Hgb: 12.6 gm/dL (ref 11.2–15.7)
Imm Gran %: 0.4 % (ref <1)
Lymphocytes: 16.7 %
MCH: 32.7 pg — ABNORMAL HIGH (ref 26.0–32.0)
MCHC: 36 g/dL (ref 32.0–36.0)
MCV: 90.9 um3 (ref 79.0–95.0)
MPV: 9.6 fL (ref 9.4–12.4)
Monocytes: 9.3 %
Plt Count: 143 10*3/uL (ref 140–370)
RBC: 3.85 10*6/uL — ABNORMAL LOW (ref 3.90–5.20)
RDW: 13.5 % (ref 12.0–14.0)
Segs: 72.4 %
WBC: 5.2 10*3/uL (ref 4.0–10.0)

## 2022-11-26 LAB — COMPREHENSIVE METABOLIC PANEL, BLOOD
ALT (SGPT): 41 U/L — ABNORMAL HIGH (ref 0–33)
AST (SGOT): 27 U/L (ref 0–32)
Albumin: 4.5 g/dL (ref 3.5–5.2)
Alkaline Phos: 159 U/L — ABNORMAL HIGH (ref 40–130)
Anion Gap: 12 mmol/L (ref 7–15)
BUN: 11 mg/dL (ref 8–23)
Bicarbonate: 27 mmol/L (ref 22–29)
Bilirubin, Tot: 0.54 mg/dL (ref <1.2)
Calcium: 9.3 mg/dL (ref 8.5–10.6)
Chloride: 104 mmol/L (ref 98–107)
Creatinine: 0.46 mg/dL — ABNORMAL LOW (ref 0.51–0.95)
Glucose: 114 mg/dL — ABNORMAL HIGH (ref 70–99)
Potassium: 3.7 mmol/L (ref 3.5–5.1)
Sodium: 143 mmol/L (ref 136–145)
Total Protein: 7.5 g/dL (ref 6.0–8.0)
eGFR Based on CKD-EPI 2021 Equation: >60 mL/min/{1.73_m2}

## 2022-11-26 LAB — CEA TUMOR, BLOOD: CEA Tumor: 14.3 ng/mL

## 2022-11-26 MED ORDER — LANREOTIDE ACETATE 120 MG/0.5ML SC SOLN
120.0000 mg | Freq: Once | SUBCUTANEOUS | Status: AC
Start: 2022-11-26 — End: 2022-11-26
  Administered 2022-11-26: 120 mg via SUBCUTANEOUS
  Filled 2022-11-26: qty 1

## 2022-11-26 NOTE — Interdisciplinary (Signed)
Calton Dach presents for LANREOTIDE      Pre-treatment nursing assessment:   Patient arrived by ambulating  accompanied by Self   Vital signs are stable, and the patient is in no apparent distress.     11/26/22  1219   BP: 135/75   Pulse: 69   Temp: 97.9 F (36.6 C)   Resp: 17   SpO2: 100%        Patient denies nausea, vomiting, diarrhea, constipation, or fatigue.      Endorsed care to [facility], LVN to administer medication and schedule next appointment. LVN to draw labs per treatment plan if indicated and complete ongoing medication education using approved educational handouts.       Medications   lanreotide acetate (SOMATULINE DEPOT) depot injection 120 mg (120 mg Subcutaneous Given 11/26/22 1334)

## 2022-11-26 NOTE — Patient Instructions (Addendum)
- Please return for follow up in 3 months after labs and scans  - Please call [facility] Scheduling at [phone] to schedule your appointment in about two and a half months  - Please go to fast track for a lanreotide injection, you should be having monthly lanreotide injections      _____________________________________________________________________    Your Team:      PHYSICIAN: Tressie Ellis, MD PhD      NURSE PRACTITIONER: Davy Pique, NP       NURSE CASE MANAGER: Freddie Breech RN    Phone: [phone]      I may be unable to answer phone calls/voicemails immediately during clinic days, so if you have an urgent question please call our call center [phone]) and they can page me.  You can also choose to speak to the triage nurse for urgent medical questions by selecting that option when you call me.     ADMINISTRATIVE ASSISTANT: Birdie Hopes (Paperwork, obtaining imaging, scheduling)  Phone: [phone]  Fax: [phone]    PATIENT NAVIGATOR: Alessandra Bevels   Phone: [phone]     *IF you are receiving infusions, please check with the infusion center for a code to enable you to receive free parking on infusion days. Please call [phone] Option 2, or stop at the front desk on your way out. These codes change monthly*                                                                                                                                                 If you have a medical emergency or life threatening situaton, please call 911 or go to the nearest Emergency Room.    AFTER-HOURS CARE:    If you develop a fever of 100.46F or greater after hours (On the weekends, holidays, and/or Monday through Friday before 8:00am or after 4:30pm) please call [phone] and ask to speak with the On-Call Oncologist. Please also call the On-Call Oncologist for any urgent symptoms that happen after normal business hours.    AFTER HOURS EMERGENCY NUMBER [phone]                       Ask for the  Oncology Physician On-Call     The following link is a video that will help explain choosing a healthcare proxy and the process of advanced care planning.   [email or web address].com/program-preview-who-speaks-[email or web address]-you/    [facility] Phone List for Patients     Clinic Hours of Operation: Monday-Friday 8:00- 4:30 p.m. (closed holidays and weekends)   Infusion Center Hours: Monday-Friday 7:00-9:30 p.m.; Sat-Sun 8:00-4:30pm.     Social Worker:   GI cancers: Channing Mutters, LCSW, [phone]  Pancreas cancer: Verdon Cummins, LCSW, [phone]  For emotional, social, spiritual and practical needs that may arise throughout cancer treatment and recovery.  Information Desk/Call Center: [phone]   ** General information: directions, telephone numbers, or available services     [place]: [phone], option 2  ** Call to schedule, cancel, or reschedule chemotherapy appointments   [place] Scheduling: [phone]  [place] Scheduling: [phone]    Radiation Oncology: [phone]   ** Call to schedule, cancel, or reschedule radiation appointments     Financial Counselor: Esperanza Richters [phone]   [email or web address]   [phone] is the centralized billing and insurance information number with a team of knowledgeable representatives ready to help M-F 9am to 6pm.     Registration: [phone] (to update personal information)    [place]:   [phone]  Open M-F 9-6pm    Radiology Scheduling: [phone]   For PET scans: [phone]    Medical Records: [phone]  Fax: [phone]    Chemotherapy Online Education:  Norm Parcel to recorded Chemotherapy & Immunotherapy Education Class: [email or web address].hunt.info/     Patient and [place]:  The Patient and [facility]) offers: The most  up-to-date cancer information for patients, families, and the community. Informative brochures, videos, and books to help you learn about your cancer diagnosis, treatment options, managing symptoms and side effects, clinical trials, nutrition, coping, caregiving, and self-care. Computers with Internet access to the most comprehensive and credible websites for cancer information and community resources. Various comfort items such as lap blankets, pillows, hats, and wigs.  Open daily; hours vary   Location: [facility], Room 1066, on the first floor just to the left of the lobby elevators  Phone [phone]     *If you get a bill from a molecular testing company ([place], [place], [place], [place], [place]) please let us know before you pay. Our staff can help resolve this.

## 2022-11-26 NOTE — Addendum Note (Signed)
Addended by: Tressie Ellis on: 11/26/2022 11:17 AM     Modules accepted: Orders

## 2022-11-26 NOTE — Progress Notes (Signed)
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 Ranell Patrick at [place] on octreotide since 08/2017. Previous to this noted watery diarrhea and had increase from 20mg  to 30mg  with good resolution. Has had no abdominal pain or carcinoid symptoms. Recent CT CAP and MRI of pancreas October 2020 found new hepatic lesions and slight interval growth compared with 1 year prior.    She is here to discuss additional treatment options for her and overall prognosis.     05/22/2020: presents alone. States that the [facility] oncology practice is 'closing' and that she wants to transfer care to [facility]. Has report from October that says there is progression on her MRI scans but they continued on octreotide 30mg  as she was clinically well. Otherwise notes diarrhea if her injection is delayed. Is set to get last injection at [facility] on 05/28/2020 then will need 06/2020 injection from that point forward.     07/18/2020: presents alone. Recent ED visit (1/24) with abdominal pain on right abdomen subcostal, severe when lying down. Restaged at that time showing minor right pleural thickening above right hepatic disease. Scattered 4mm nodules.No pain has returned, no carcinoid issues.    10/17/2020:  Presents alone. On monthly Sandostatin LAR, tolerating well. Denies flushing, abdominal pain, or diarrhea.    11/07/2020: presents alone today. Restaging scans completed with SD. No diarrhea, flushing, pain.    03/26/2021: presents alone today. Stress about scans but stable since 10/2020. OK with three times a year scans. No issues with carcinoid  syndrome. Has RUQ pain stable, believes from stress. Discussion on injection and depot with previous scar tissue.    05/29/2021: presents today alone.  Has no particular symptoms of carcinoid syndrome.  Her last scans done September 2022 show stable disease without any progression.    02/19/2022:   Denies flushing, diarrhea for the past several years (though these were presenting sx in Fall 2018). Reports concerns with injection of sandostatin - problems with needle breaking and was concerned that she wasn't getting the whole dose because she didn't have an injection site reaction. Also had an episode of significant bruising after last infusion with some continued discomfort to palpation. Last injection 8/15.  Denies fevers, chills, bony pain, abdominal distension, changes in BM, abdominal pain, n/v.     03/19/2022: presents today alone. She had PET Cu64 complete with disease in liver and pancreatic tail with T8 lesion. She has no symptoms and no back pain. A lot of family issues with husband celiac, daughter crohns, daughter in law with cervical dystonia. Would like to know options for treatment. Without diarrhea, flushing, pain.     05/27/2022: tolerated treatment well without issues. No vomiting but had nausea. Went through about 3 bottles of water. Had a syncopal episode at 10pm after getting off the couch and going to the restroom. Husband found her on the floor, no injuries but rib pain on the left. Noted she was next to neuropsych unit and could hear yelling next door. Otherwise tolerated therapy well and husband took  her home without issues.     07/29/2022: presents alone today. Feeling very well with PRRT and no issues with next round. Was prepared with hydration as well as food. Potassium was normal on recheck of labs and LFTs are stable, blood counts stable. Pending follow up PET Cu64     09/23/2022:  Patient presents today in-person.  She is tolerating the PRRT exceptionally well with no particular fatigue right  upper quadrant pain or hyperkalemia.  She is questions regarding restaging scans in steps.    11/26/2022: presents alone today in person. Feeling very well and has had great response on PET Cu64 and CEA downtrending. Daughter setting up a lab at [employer] as Geophysicist/field seismologist professor. Eating and drinking very well no abdominal pain.     Review of systems: pain from left rib s/p fall resolved, less anxiety with treatment ongoing and very talkative, all other systems reviewed and are negative except for what is described in the history of present illness.    Oncologic Timeline:  09/09/2017-04/06/2019: Octreotide Qmonthly  04/27/2019: [facility] Consult  03/2020: Per report, progression on MRI, continues on octreotide 30mg  monthly  05/22/2020: F/U Dr. Art Buff  07/15/2020: ED visit with pain, CTA negative  07/18/2020: RV Dr. Art Buff  2/20/222: MRI AP- hepatic lesions similar in size  10/17/2020: RV Dr Art Buff  11/07/2020: RV Dr. Art Buff  03/26/2021: RV  05/26/2021: RV  07/27/2020: RV with new staging  02/19/2022: RV with new staging  03/19/2022: plan to move to lutathera  05/05/2022: Lutathera D1  05/28/2022: Octreotide  06/30/2022: Lutathera D2  10/20/2022: Lutathera C4  11/20/2022: PRRT with response, CEA down  11/26/2022: Lanreotide    -----------------------------  Past Medical History:   Past Medical History:   Diagnosis Date    Liver metastases (CMS-HCC) 05/05/2019       Medications:   Current Outpatient Medications:     octreotide (SANDOSTATIN LAR) 30 MG injection, 30 mg by IntraMUSCULAR route every 28 days., Disp: , Rfl:     Allergies:   Allergies   Allergen Reactions    Valproic Acid Unspecified     Reaction(s): Unknown       Social history:   Alcohol: None  Tobacco: None  Drugs: None  Living Situation: Lives with husband, daughter is a PhD Chiropodist at [employer]    Family history:  Father: Parkinson/Lewy Body Dementia  Cousins: breast, brain, colon cancer and lymphoma    -----------------------------  Physical exam:  Vitals:BP  155/81 (BP Location: Left arm, BP Patient Position: Sitting, BP cuff size: Regular)   Pulse 86   Temp 98.2 F (36.8 C) (Temporal)   Resp 16   Ht 5' 5.16" (1.655 m)   Wt 68 kg (150 lb)   SpO2 100%   BMI 24.84 kg/m   Wt Readings from Last 3 Encounters:   11/26/22 68 kg (150 lb)   10/20/22 70.3 kg (154 lb 15.7 oz)   09/23/22 69.4 kg (153 lb)     PHYSICAL EXAM    ECOG: 0  GENERAL APPEARANCE: The patient is an alert, cooperative female in no acute distress.  SKIN: No lesions or rashes. No jaundice  EYES: PERRLA, EOMs are intact. No icterus.   EAR, NOSE, AND THROAT: The oropharynx is clear and moist.   LYMPH NODES: No appreciable observable lymphadenopathy  CHEST: The lungs are clear to auscultation. Normal respiratory effort  HEART: RRR S1 and S2 are normal.  There are no murmurs or extra sounds.  ABDOMEN:  The  abdomen is soft and nontender. +BS. There is no hepatosplenomegaly. R buttock with some discomfort with palpation but no bruising.   EXTREMITES: There is no edema or cyanosis. Joints are normal without redness or swelling.  NEURO:  Mental status is normal.  No focal neurologic findings.           -----------------------------  Labs: Reviewed by myself  Lab Results   Component Value Date    WBC 4.7 10/19/2022    RBC 3.85 (L) 10/19/2022    HGB 12.2 10/19/2022    HCT 34.4 10/19/2022    MCV 89.4 10/19/2022    MCHC 35.5 10/19/2022    RDW 13.4 10/19/2022    PLT 154 10/19/2022    MPV 9.7 10/19/2022     Lab Results   Component Value Date    BUN 13 10/19/2022    CREAT 0.52 10/19/2022    CL 104 10/19/2022    NA 142 10/19/2022    K 4.0 10/20/2022    [place] 9.1 10/19/2022    TBILI 0.39 10/19/2022    ALB 4.5 10/19/2022    TP 7.3 10/19/2022    AST 25 10/19/2022    ALK 161 (H) 10/19/2022    BICARB 28 10/19/2022    ALT 31 10/19/2022    GLU 115 (H) 10/19/2022     Pancreatic Polypeptide 07/2017: 837  Chromogranin A 07/2017: 1  CA19-9 07/2017: 8  Alk Phos 11/2018: 176    Colonoscopy    Radiology: Reviewed by myself in conjunction  with radiology report  PET/CT Cu64 Dotatate Skull To Mid Thigh (Non Diag CT For [facility])    Result Date: 11/20/2022  IMPRESSION: Interval decrease of the abnormal radiotracer uptake compared to prior copper-64 DOTATATE PET/CT study of 02/24/2022, which suggest partial treatment response. Previously described lesion, still demonstrating somatostatin receptor positive tumor involving pancreatic tail mass, somatostatin receptor positive peripancreatic and periportal nodal metastases, small soft tissue metastasis adjacent to right 12th rib, extensive hepatic metastases, and osseous metastasis involving T8.    Nuc Med Lutathera LU-177 Injection    Result Date: 10/20/2022  IMPRESSION: 194.9 (mCi) of Lutathera was administered. - Dose #4      03/2020 [facility] Report      04/04/2019 MRI Pancreas    CT Chest 03/27/2019  - No evidence of thoracic metastatic disease    CT A/P 03/24/2019    PET Gallium 68  - Primary pancreatic mass with intense radiotracer uptake and multiple hepatic metastatic lesions    Pathology:    Ki67 3%    Molecular:    PDL1 0%      -----------------------------  Assessment & Plan:  Jenna Bridges is a 66 year old female presenting with metastatic PNET with progression on 30mg  Octreotide Monthly + Lutathera > Lanreotide    We discussed with the patient and their family the natural course of NET. We discussed how many of these come from GI sources including the pancreas, colon, and stomach. Occasionally they are seen from the lung as well. We discussed that they can be indolent and slow growing or quickly capable of rapid growth and spread. Treatment options for symptom relief include somatostatin analogues. Although fewer than 10% of patients see tumor shrinkage with these drugs, prolonged stable disease is seen in 66% which delays progression.     We discussed that if there was interval growth on the next imaging further options include everolimus, CAPTEM, TKIs (Sunitinib, sorafenib, pazopanib, and cabozantinib) and  Lutathera. We will also evaluate the  ability to send tissue for NGS today with [facility]    Ultimately we would continue octreotide at this time and add everolimus. We discussed evaluating in 2 months to see if growth stopped or continued. After this we would then recommend either CAPTEM or Lutathera. We discussed the side effects dermatologically of everolimus and instructed the patient to pre-empt the rash with a good face hygiene regimen.     Would have an RV in January to discuss restaging scans.    05/22/2020: We will port her scans in from [facility] and restage now given 3 month interval. She previously had a PET GA68 to follow as well for Lutathera. We discussed increasing octreotide or adding regorafenib/everolimus in addition next. We will schedule her for her 06/2020 injection as well to reduce her diarrheal burden. RV monthly until stability. NGS completed as above and added to chart. 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 We discussed MEN can be used as a screening tool for future cancers and she will talk to them but doesn't want to make decision now.    07/18/2020: pain may be referred from hepatic disease, no obvious right effusion needing thoracentesis. Pain has resolved and no diarrheal symptoms. Would recommend quick interval scan in 2 months time, mid March. Continue injections of octreotide.    10/17/2020: No symptoms of carcinoid. Tolerating Sandostatin LAR without issues. Labs today (CBC CMP TSH Vit B12). Can use these labs for CT AP scheduled for 5/11. Continue Sandostatin injections and follow up after imaging.     11/07/2020: doing very well, scans show stability. No nutritional deficiencies seen on labs. Continue with therapy regularly each month. Restage in 3-6 months.    03/26/2021: continues to do very well. RUQ pain likely related to either liver  capsule stretching or possible anxiety. Stable disease on scans without evidence of carcinoid syndrome. Again discussion on MEN1 and screening for pituitary and thyroid disorders and declines. Also discussed genetics counseling and family but declines as well. Will see her in 2 months with scans in 4 months.     05/29/2021:  Continuing to do very well on current dosage and schedule.  We will restage in 6 months' time.    02/19/2022: CT of chest, abdomen, and pelvis significant for interval increase in liver lesions as well as increased lymphadenopathy and increased sclerotic lesion on T8 vertebral body, but similar size of pancreatic tail mass. Discussed next steps including possible treatments such as everolimus, IR ablation, chemotherapy, and lutathera. Plan to continue octreotide and obtain a PET-copper scan to better characterize size and location of lesions. Will determine additional treatment based on results of PET.     03/19/2022: at this time with PET Cu64 showing diffuse and progressive disease discussed multiple options for improved control. Discussed biopsy of newly growing area but patient declines at this time. Discussed moving to lanreotide but concern for definitive control, more likely slow growth over time. Does not wish to take a daily pill or oral chemotherapy. As such Lutathera may be a more appropriate route. It does not disrupt her day to day and allows her to continue with a more effective, targeted approach that appeals to her. Will have the Nuclear Medicine team reach out to her to schedule and work with them to align our octreotide schedule with her treatment.     05/27/2022:  will continue with Lutathera every 8 weeks. Will have octreotide tomorrow here at [facility]. Has weight gain and energy. Left rib pain likely from fall and rib pain and MSK bruising but explained it will take months to heal. Discussed at length her chronic treatment regimen is tiresome and its ok to feel stressed or tired or  take care of herself over others.     07/29/2022: next PRRT on 08/25/2022 and again 10/20/2022. We will order her PET Cu64 thereafter. Continuing monitoring renal function and hyperkalemia fro mPRRT as well as LFTs. Carcinoid well controlled on interval octreotide. EKGs appropriate s/p PRRT.     09/23/2022:  At this time we will sign off on her last PRRT and order a PET copper to be completed 4 weeks after to permit octreotide washout.  She should not get octreotide prior to this injection as it will block the receptors needed for the imaging.  We will then have her return to discuss the next steps and necessity of additional PRRT, continue with octreotide alone, adding additional oral tablets, or any liver directed therapies.    11/26/2022: will move from PRRT now to Lanreotide monthly. Now will continue with restaging in 3 months to see if improved on scans or not. Improved pain in abdomen. Has weight gain but no intentional increase but acceptable for her.             ICD-10-CM ICD-9-CM   1. 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 Primary pancreatic neuroendocrine tumor  D3A.8 209.69   3. Carcinoid syndrome (CMS-HCC)  E34.0 259.2           Therapeutic consent is obtained with all patients prior to the first infusion visit or dose. We discuss alternative treatments, expected side effects, and rare occurrences. Further the patient and I discuss the expected outcomes as well as contact number for anyone on our team.      I spent a total of 40 minutes on the day of the visit.    Tressie Ellis MD/PhD      -----------------------------  Lab   No orders of the defined types were placed in this encounter.    Imaging   Orders Placed This Encounter   Procedures    CT Chest With Contrast    CT Abdomen And Pelvis With Contrast     Procedures   No orders of the defined types were placed in this encounter.    Other No orders of the defined types were placed in this encounter.

## 2022-11-26 NOTE — Interdisciplinary (Signed)
Blood draw per [place] protocol  Patient is here for routine Blood work. Identified using 2 identifiers    23 G Butterfly started on LEFT HAND   Positive blood return. #1 attempt     Tubes used:    [place] PST -2  Small Lavender - 1      Specimens COLLECTED.      Pre-administration assessment of patient completed by KAT, RN.     11/26/22  1219   BP: 135/75   Pulse: 69   Temp: 97.9 F (36.6 C)   Resp: 17   SpO2: 100%     Medications   lanreotide acetate (SOMATULINE DEPOT) depot injection 120 mg (120 mg Subcutaneous Given 11/26/22 1334)         120 mg LANREOTIDE given SQ to right upper quad. gluteus using aseptic technique.        Patient tolerated injection without adverse effects. Site benign. Band-aid placed.      Patient discharged to home in no apparent distress.   Discharge mode: Ambulatory   Return to clinic in 1 month(s) for LANREOTIDE.   request submitted.         Floreen Comber, [place]  11/26/22  1:40 PM

## 2022-12-23 ENCOUNTER — Telehealth (HOSPITAL_BASED_OUTPATIENT_CLINIC_OR_DEPARTMENT_OTHER): Payer: Self-pay

## 2022-12-23 NOTE — Telephone Encounter (Signed)
Called patient per [facility] schedules with no answer. L/M in regards to scheduling future SOMATULINE DEPOT at [facility] instead of [facility]

## 2022-12-24 ENCOUNTER — Ambulatory Visit
Admission: RE | Admit: 2022-12-24 | Discharge: 2022-12-24 | Disposition: A | Discharge status: Home / Self Care | Payer: Medicare Other | Attending: Hematology & Oncology | Admitting: Hematology & Oncology

## 2022-12-24 VITALS — BP 152/85 | HR 89 | Temp 98.4°F | Resp 16 | Ht 65.16 in | Wt 155.2 lb

## 2022-12-24 DIAGNOSIS — E340 Carcinoid syndrome: Secondary | ICD-10-CM | POA: Insufficient documentation

## 2022-12-24 DIAGNOSIS — C787 Secondary malignant neoplasm of liver and intrahepatic bile duct: Secondary | ICD-10-CM | POA: Insufficient documentation

## 2022-12-24 DIAGNOSIS — C254 Malignant neoplasm of endocrine pancreas: Secondary | ICD-10-CM | POA: Insufficient documentation

## 2022-12-24 MED ORDER — LANREOTIDE ACETATE 120 MG/0.5ML SC SOLN
120.0000 mg | Freq: Once | SUBCUTANEOUS | Status: AC
Start: 2022-12-24 — End: 2022-12-24
  Administered 2022-12-24: 120 mg via SUBCUTANEOUS
  Filled 2022-12-24: qty 1

## 2022-12-24 NOTE — Interdisciplinary (Signed)
Pre-administration assessment of patient completed by TJ, RN.    BP 152/85 (BP Location: Left arm, BP Patient Position: Sitting)   Pulse 89   Temp 98.4 F (36.9 C) (Temporal)   Resp 16   Ht 5' 5.16" (1.655 m)   Wt 70.4 kg (155 lb 3.3 oz)   SpO2 100%   BMI 25.70 kg/m        120 mg LANREOTIDE given SC to left upper quad. gluteus using aseptic technique.        Patient tolerated injection without adverse effects. Site benign. Band-aid placed.      Patient discharged to home in no apparent distress.   Discharge mode: Ambulatory   Return to clinic in 4 week(s) for LANREOTIDE.   request submitted.      Medications   lanreotide acetate (SOMATULINE DEPOT) depot injection 120 mg (120 mg Subcutaneous Given 12/24/22 1000)     Tacy Learn Samanthan Dugo, LVN  12/24/22

## 2022-12-24 NOTE — Interdisciplinary (Signed)
Jenna Bridges presents for LANREOTIDE      Pre-treatment nursing assessment:   Patient arrived by ambulating  accompanied by Self   Vital signs are stable, and the patient is in no apparent distress.     12/24/22  0945   BP: 152/85   Pulse: 89   Temp: 98.4 F (36.9 C)   Resp: 16   SpO2: 100%        Patient denies nausea, vomiting, diarrhea, constipation, or fatigue.      Endorsed care to [place], [place] to administer medication and schedule next appointment. LVN to draw labs per treatment plan if indicated and complete ongoing medication education using approved educational handouts.

## 2022-12-25 NOTE — Telephone Encounter (Signed)
Called patient back and patient declined stating they would like to stay at [facility].

## 2022-12-28 ENCOUNTER — Ambulatory Visit (HOSPITAL_BASED_OUTPATIENT_CLINIC_OR_DEPARTMENT_OTHER): Payer: Medicare Other

## 2023-01-01 NOTE — Addendum Note (Signed)
Encounter addended by: Wannetta Sender on: 01/01/2023 9:32 AM   Actions taken: Flowsheet accepted, Charge Capture section accepted

## 2023-01-21 ENCOUNTER — Ambulatory Visit
Admission: RE | Admit: 2023-01-21 | Discharge: 2023-01-21 | Disposition: A | Discharge status: Home / Self Care | Payer: Medicare Other | Attending: Hematology & Oncology | Admitting: Hematology & Oncology

## 2023-01-21 VITALS — BP 140/78 | HR 78 | Temp 97.5°F | Resp 17 | Ht 65.16 in | Wt 155.2 lb

## 2023-01-21 DIAGNOSIS — E340 Carcinoid syndrome: Secondary | ICD-10-CM | POA: Insufficient documentation

## 2023-01-21 DIAGNOSIS — C787 Secondary malignant neoplasm of liver and intrahepatic bile duct: Secondary | ICD-10-CM

## 2023-01-21 DIAGNOSIS — C254 Malignant neoplasm of endocrine pancreas: Secondary | ICD-10-CM

## 2023-01-21 MED ORDER — LANREOTIDE ACETATE 120 MG/0.5ML SC SOLN
120.0000 mg | Freq: Once | SUBCUTANEOUS | Status: AC
Start: 2023-01-21 — End: 2023-01-21
  Administered 2023-01-21: 120 mg via SUBCUTANEOUS
  Filled 2023-01-21: qty 1

## 2023-01-21 NOTE — Interdisciplinary (Signed)
Pre-administration assessment of patient completed by RJ, RN.     01/21/23  1014   BP: 140/78   Pulse: 78   Temp: 97.5 F (36.4 C)   Resp: 17   SpO2: 99%        120 mg LANREOTIDE given SQ to right upper quad. gluteus using aseptic technique.        Patient tolerated injection without adverse effects. Site benign. Band-aid placed.      Patient discharged to home in no apparent distress.   Discharge mode: Ambulatory   Return to clinic in 1 month(s) for LANREOTIDE.   request submitted.  Medications   lanreotide acetate (SOMATULINE DEPOT) depot injection 120 mg (120 mg Subcutaneous Given 01/21/23 1032)

## 2023-01-25 NOTE — Addendum Note (Signed)
Encounter addended by: Lennie Muckle on: 01/25/2023 7:31 AM   Actions taken: Charge Capture section accepted

## 2023-02-15 ENCOUNTER — Telehealth (HOSPITAL_BASED_OUTPATIENT_CLINIC_OR_DEPARTMENT_OTHER): Payer: Self-pay

## 2023-02-15 NOTE — Telephone Encounter (Signed)
Dr. Art Buff,     Jenna Bridges is scheduled for infusion of Lanreotide on 8/29.     The patient has a scheduled clinic visit on 9/18.    The following items are currently outstanding:    Orders are unsigned    *As a reminder of our infusion center process, if an unsigned order and/or consent are not addressed 1 business day before the infusion center appointment, the patient appointment will be cancelled.      Thank you,   Theora Gianotti, RN  RN Liaison

## 2023-02-18 ENCOUNTER — Ambulatory Visit: Admit: 2023-02-18 | Discharge: 2023-02-18 | Payer: Medicare Other

## 2023-02-18 VITALS — BP 140/75 | HR 80 | Temp 98.1°F | Resp 17 | Ht 65.16 in | Wt 155.0 lb

## 2023-02-18 DIAGNOSIS — C787 Secondary malignant neoplasm of liver and intrahepatic bile duct: Secondary | ICD-10-CM | POA: Insufficient documentation

## 2023-02-18 DIAGNOSIS — C254 Malignant neoplasm of endocrine pancreas: Secondary | ICD-10-CM

## 2023-02-18 DIAGNOSIS — E340 Carcinoid syndrome: Secondary | ICD-10-CM | POA: Insufficient documentation

## 2023-02-18 MED ORDER — LANREOTIDE ACETATE 120 MG/0.5ML SC SOLN
120.0000 mg | Freq: Once | SUBCUTANEOUS | Status: AC
Start: 2023-02-18 — End: 2023-02-18
  Administered 2023-02-18: 120 mg via SUBCUTANEOUS
  Filled 2023-02-18: qty 1

## 2023-02-18 NOTE — Interdisciplinary (Signed)
Pre-administration assessment of patient completed by MARISSA, RN.     02/18/23  0932   BP: 140/75   Pulse: 80   Temp: 98.1 F (36.7 C)   Resp: 17   SpO2: 98%        120 mg LANREOTIDE given IM to left upper quad. gluteus using aseptic technique.        Patient tolerated injection without adverse effects. Site benign. Band-aid placed.      Patient discharged to home in no apparent distress.   Discharge mode: Ambulatory   Return to clinic in 1 month(s) for LANREOTIDE.   request submitted.    Medications   lanreotide acetate (SOMATULINE DEPOT) depot injection 120 mg (120 mg Subcutaneous Given 02/18/23 0940)     Koren Bound, LVN

## 2023-02-19 NOTE — Addendum Note (Signed)
Encounter addended by: Ulis Rias on: 02/19/2023 1:03 PM   Actions taken: Charge Capture section accepted

## 2023-02-23 ENCOUNTER — Telehealth (HOSPITAL_BASED_OUTPATIENT_CLINIC_OR_DEPARTMENT_OTHER): Payer: Self-pay

## 2023-02-23 DIAGNOSIS — C7B02 Secondary carcinoid tumors of liver: Secondary | ICD-10-CM

## 2023-02-23 NOTE — Telephone Encounter (Signed)
Outpatient Oncology RN Narrative    - Message received requesting lab orders prior to CT, lab orders placed per Dr. Art Buff, spoke to patient who will come in tomorrow to complete these

## 2023-02-24 ENCOUNTER — Other Ambulatory Visit: Payer: Medicare Other | Attending: Hematology & Oncology

## 2023-02-24 DIAGNOSIS — C7B02 Secondary carcinoid tumors of liver: Secondary | ICD-10-CM | POA: Insufficient documentation

## 2023-02-24 LAB — COMPREHENSIVE METABOLIC PANEL, BLOOD
ALT (SGPT): 24 U/L (ref 0–33)
AST (SGOT): 21 U/L (ref 0–32)
Albumin: 4.3 g/dL (ref 3.5–5.2)
Alkaline Phos: 134 U/L — ABNORMAL HIGH (ref 40–130)
Anion Gap: 8 mmol/L (ref 7–15)
BUN: 15 mg/dL (ref 8–23)
Bicarbonate: 29 mmol/L (ref 22–29)
Bilirubin, Tot: 0.5 mg/dL (ref <1.2)
Calcium: 9.3 mg/dL (ref 8.5–10.6)
Chloride: 103 mmol/L (ref 98–107)
Creatinine: 0.58 mg/dL (ref 0.51–0.95)
Glucose: 110 mg/dL — ABNORMAL HIGH (ref 70–99)
Potassium: 3.9 mmol/L (ref 3.5–5.1)
Sodium: 140 mmol/L (ref 136–145)
Total Protein: 7.5 g/dL (ref 6.0–8.0)
eGFR Based on CKD-EPI 2021 Equation: >60 mL/min/{1.73_m2}

## 2023-02-24 LAB — CEA TUMOR, BLOOD: CEA Tumor: 13 ng/mL

## 2023-02-24 LAB — CBC WITH DIFF, BLOOD
ANC-Automated: 4.3 10*3/uL (ref 1.6–7.0)
ANC-Instrument: 4.3 10*3/uL (ref 1.6–7.0)
Abs Basophils: 0 10*3/uL (ref <0.2)
Abs Eosinophils: 0.1 10*3/uL (ref 0.0–0.5)
Abs Lymphs: 1 10*3/uL (ref 0.8–3.1)
Abs Monos: 0.6 10*3/uL (ref 0.2–0.8)
Basophils: 0.2 %
Eosinophils: 0.8 %
Hct: 34.5 % (ref 34.0–45.0)
Hgb: 12.3 gm/dL (ref 11.2–15.7)
Imm Gran %: 0.5 % (ref <1)
Imm Gran Abs: 0 10*3/uL (ref <0.1)
Lymphocytes: 17.2 %
MCH: 32.9 pg — ABNORMAL HIGH (ref 26.0–32.0)
MCHC: 35.7 g/dL (ref 32.0–36.0)
MCV: 92.2 um3 (ref 79.0–95.0)
MPV: 9.4 fL (ref 9.4–12.4)
Monocytes: 10.2 %
Plt Count: 153 10*3/uL (ref 140–370)
RBC: 3.74 10*6/uL — ABNORMAL LOW (ref 3.90–5.20)
RDW: 13 % (ref 12.0–14.0)
Segs: 71.1 %
WBC: 6 10*3/uL (ref 4.0–10.0)

## 2023-02-26 ENCOUNTER — Ambulatory Visit
Admission: RE | Admit: 2023-02-26 | Discharge: 2023-02-26 | Disposition: A | Discharge status: Home / Self Care | Payer: Medicare Other | Attending: Hematology & Oncology | Admitting: Hematology & Oncology

## 2023-02-26 DIAGNOSIS — C7B02 Secondary carcinoid tumors of liver: Secondary | ICD-10-CM | POA: Insufficient documentation

## 2023-02-26 DIAGNOSIS — C7951 Secondary malignant neoplasm of bone: Secondary | ICD-10-CM

## 2023-02-26 DIAGNOSIS — C254 Malignant neoplasm of endocrine pancreas: Secondary | ICD-10-CM

## 2023-02-26 MED ORDER — IOHEXOL 350 MG/ML IV SOLN
500.0000 mL | Freq: Once | INTRAVENOUS | Status: AC
Start: 2023-02-26 — End: 2023-02-26
  Administered 2023-02-26: 100 mL via INTRAVENOUS

## 2023-03-08 NOTE — Patient Instructions (Addendum)
Continue Lanreotide injections monthly. Next on 9/25 as scheduled  CT scans to be done in March.  Please call [facility] scheduling at [phone] to schedule your scans. .   Once you know the date of the scans, please call Ana to schedule a follow up with Dr Art Buff after scans      _____________________________________________________________________    Your Team:      PHYSICIAN: Tressie Ellis, MD PhD      NURSE PRACTITIONER: Davy Pique, NP       NURSE CASE MANAGER: Freddie Breech RN       ADMINISTRATIVE ASSISTANT: Birdie Hopes (Paperwork, obtaining imaging, scheduling)  Phone: [phone]  Fax: [phone]    PATIENT NAVIGATOR: Alessandra Bevels   Phone: [phone]     *IF you are receiving infusions, please check with the infusion center for a code to enable you to receive free parking on infusion days. Please call [phone] Option 2, or stop at the front desk on your way out. These codes change monthly*                                                                                                                                                   Contacting your Cancer Care Team:    For general healthcare questions, call your nurse/care team: [phone], 8 a.m. -4:30 p.m., Monday through Friday      If you are unable to reach your nurse AND you have urgent symptoms, call [facility] [phone], 8 a.m. - 9 p.m., Monday through Friday      After hours, for urgent symptoms contact [phone] and ask for the on-call oncologist.      For medical emergencies, please call 911                                                                                                                                                         The following link is a video that will help explain choosing a healthcare proxy and the process of advanced care planning.   https://www.emmisolutions.com/program-preview-who-speaks-for-you/    [facility] Phone List for Patients     Clinic Hours  of  Operation: Monday-Friday 8:00- 4:30 p.m. (closed holidays and weekends)   Infusion Center Hours: Monday-Friday 7:00-9:30 p.m.; Sat-Sun 8:00-4:30pm.     Social Worker:   GI cancers: Channing Mutters, LCSW, [phone]  Pancreas cancer: Verdon Cummins, LCSW, [phone]  For emotional, social, spiritual and practical needs that may arise throughout cancer treatment and recovery.    Information Desk/Call Center: [phone]   ** General information: directions, telephone numbers, or available services     [facility]: [phone], option 2  ** Call to schedule, cancel, or reschedule chemotherapy appointments   [facility] Scheduling: [phone]  [facility] Scheduling: [phone]    Radiation Oncology: [phone]   ** Call to schedule, cancel, or reschedule radiation appointments     Interventional Radiology: [phone]      Financial Counselor: De Hollingshead [phone]   [email or web address]   [phone] is the centralized billing and insurance information number with a team of knowledgeable representatives ready to help M-F 9am to 6pm.     Registration: [phone] (to update personal information)    [facility]:   Ph:[phone]  Fax:[phone]  Open M-F 9-6pm    Radiology Scheduling: [phone]  For PET scans: [phone]    Medical Records: [id number]  Fax: [phone]    Chemotherapy Online Education:  Cris[employer] [place] to recorded Chemotherapy & Immunotherapy Education Class: [email or web address]     Patient and Family Resource Center:  The Patient and Family Resource Center [facility]) offers the most up-to-date cancer information to patients, families, and the community. Various comfort items such as lap blankets, pillows, hats, and referrals for wigs are also available.  Open daily; hours vary--staffed by knowledgeable patient navigators and  trained volunteers  Location: [facility], Room 1066, on the first floor just to the left of the lobby elevators  Phone [phone]      [facility] de [place] para Pacientes y sus Familias:  [facility] para Pacientes y Secondary school teacher (Patient and [employer])  Ofrece a pacientes, familiares, and la comunidad en general, informacin Catering manager.   Tambin ah artculos disponibles como pequeas cobijas, almohadas, sombreros, y referencias para pelucas.   Abierto de Lunes a Domingo; horario varea--personal asignado al centro consiste de Navegadores para Pacientes (Patient Navigators) y voluntarios.  [employer] de Cncer: [place] [facility]), numero de cuarto [phone],  en el primer piso, al lado izquierdo del elevador junto la sala de espera   Nmero de telfono: [phone]     .............................................................................................................................................    Patient Experience Department    [facility]'s Patient Experience Department is here to assist you and your family to ensure that your experience with [place] is a positive one. Please tell us about your experience by contacting us at [phone], or at [email or web address].  Your feedback will be kept confidential.    *If you get a bill from a molecular testing company [facility], [place], [place], [place], [place], [place]) please let us know before you pay. Our staff can help resolve this.

## 2023-03-08 NOTE — Progress Notes (Signed)
GI Oncology Progress    Name: Jenna Bridges  DOB: 1956/05/02  Age: 67 year old  Sex: female  MRN: [id number]  Date of Service: 03/11/2023      Chief Complaint: PNET    HPI: Jenna Bridges is a 67 year old female presenting with metastatic PNET (Ki67 of 3%) on Octreotide 30mg  monthly until 04/06/2019 when interval scans showed progression per last scan at [facility] transferring all care to [place] with progression on PRRT > response > lanreotide    History of PNET following with Dr. Ranell Patrick at [place] on octreotide since 08/2017. Previous to this noted watery diarrhea and had increase from 20mg  to 30mg  with good resolution. Has had no abdominal pain or carcinoid symptoms. Recent CT CAP and MRI of pancreas October 2020 found new hepatic lesions and slight interval growth compared with 1 year prior.    She is here to discuss additional treatment options for her and overall prognosis.     05/22/2020: presents alone. States that the [facility] oncology practice is 'closing' and that she wants to transfer care to [place]. Has report from October that says there is progression on her MRI scans but they continued on octreotide 30mg  as she was clinically well. Otherwise notes diarrhea if her injection is delayed. Is set to get last injection at [facility] on 05/28/2020 then will need 06/2020 injection from that point forward.     07/18/2020: presents alone. Recent ED visit (1/24) with abdominal pain on right abdomen subcostal, severe when lying down. Restaged at that time showing minor right pleural thickening above right hepatic disease. Scattered 4mm nodules.No pain has returned, no carcinoid issues.    10/17/2020:  Presents alone. On monthly Sandostatin LAR, tolerating well. Denies flushing, abdominal pain, or diarrhea.    11/07/2020: presents alone today. Restaging scans completed with SD. No diarrhea, flushing, pain.    03/26/2021: presents alone today. Stress about scans but stable since 10/2020. OK with three times a year scans. No issues with  carcinoid syndrome. Has RUQ pain stable, believes from stress. Discussion on injection and depot with previous scar tissue.    05/29/2021: presents today alone.  Has no particular symptoms of carcinoid syndrome.  Her last scans done September 2022 show stable disease without any progression.    02/19/2022:   Denies flushing, diarrhea for the past several years (though these were presenting sx in Fall 2018). Reports concerns with injection of sandostatin - problems with needle breaking and was concerned that she wasn't getting the whole dose because she didn't have an injection site reaction. Also had an episode of significant bruising after last infusion with some continued discomfort to palpation. Last injection 8/15.  Denies fevers, chills, bony pain, abdominal distension, changes in BM, abdominal pain, n/v.     03/19/2022: presents today alone. She had PET Cu64 complete with disease in liver and pancreatic tail with T8 lesion. She has no symptoms and no back pain. A lot of family issues with husband celiac, daughter crohns, daughter in law with cervical dystonia. Would like to know options for treatment. Without diarrhea, flushing, pain.     05/27/2022: tolerated treatment well without issues. No vomiting but had nausea. Went through about 3 bottles of water. Had a syncopal episode at 10pm after getting off the couch and going to the restroom. Husband found her on the floor, no injuries but rib pain on the left. Noted she was next to neuropsych unit and could hear yelling next door. Otherwise tolerated therapy well and  husband took her home without issues.     07/29/2022: presents alone today. Feeling very well with PRRT and no issues with next round. Was prepared with hydration as well as food. Potassium was normal on recheck of labs and LFTs are stable, blood counts stable. Pending follow up PET Cu64     09/23/2022:  Patient presents today in-person.  She is tolerating the PRRT exceptionally well with no particular  fatigue right upper quadrant pain or hyperkalemia.  She is questions regarding restaging scans in steps.    11/26/2022: presents alone today in person. Feeling very well and has had great response on PET Cu64 and CEA downtrending. Daughter setting up a lab at [employer] as Geophysicist/field seismologist professor. Eating and drinking very well no abdominal pain.     03/11/2023: Presents alone. CT scans on 9/6 with continued response in liver and pancreas mass, thoracic osseous mets stable. CEA trending down. Continues on monthly Lanreotide. No pain or symptoms. Feels well overall.       Review of systems: pain from left rib s/p fall resolved, less anxiety with treatment ongoing and very talkative, all other systems reviewed and are negative except for what is described in the history of present illness.    Oncologic Timeline:  09/09/2017-04/06/2019: Octreotide Qmonthly  04/27/2019: [facility]  03/2020: Per report, progression on MRI, continues on octreotide 30mg  monthly  05/22/2020: F/U Dr. Art Buff  07/15/2020: ED visit with pain, CTA negative  07/18/2020: RV Dr. Art Buff  2/20/222: MRI AP- hepatic lesions similar in size  10/17/2020: RV Dr Art Buff  11/07/2020: RV Dr. Art Buff  03/26/2021: RV  05/26/2021: RV  07/27/2020: RV with new staging  02/19/2022: RV with new staging  03/19/2022: plan to move to lutathera  05/05/2022: Lutathera D1  05/28/2022: Octreotide  06/30/2022: Lutathera D2  10/20/2022: Lutathera C4  11/20/2022: PRRT with response, CEA down  11/26/2022: Lanreotide 120 mg    12/24/2022: Lanreotide  01/21/2023:  Lanreotide  02/18/2023:  Lanreotide  02/26/2023: CT CAP: overall continued response in liver and pancreas mass. Stable thorax osseous mets  03/11/2023: RV NP  03/17/2023: Lanreotide      -----------------------------  Past Medical History:   Past Medical History:   Diagnosis Date    Liver metastases (CMS-HCC) 05/05/2019       Medications:   Current Outpatient Medications:     octreotide (SANDOSTATIN LAR) 30 MG injection, 30 mg by IntraMUSCULAR  route every 28 days., Disp: , Rfl:     Allergies:   No Active Allergies      Social history:   Alcohol: None  Tobacco: None  Drugs: None  Living Situation: Lives with husband, daughter is a PhD Chiropodist at [employer]    Family history:  Father: Parkinson/Lewy Body Dementia  Cousins: breast, brain, colon cancer and lymphoma    -----------------------------  Physical exam:  Vitals:BP 139/83 (BP Location: Left arm, BP Patient Position: Sitting, BP cuff size: Regular)   Pulse 89   Temp 97.6 F (36.4 C) (Temporal)   Resp 16   Ht 5' 5.16" (1.655 m)   Wt 69.9 kg (154 lb)   SpO2 99%   BMI 25.50 kg/m   Wt Readings from Last 3 Encounters:   03/11/23 69.9 kg (154 lb)   02/18/23 70.3 kg (154 lb 15.7 oz)   01/21/23 70.4 kg (155 lb 3.3 oz)     PHYSICAL EXAM    ECOG: 0  GENERAL APPEARANCE: The patient is an alert, cooperative female in no acute  distress.  SKIN: No lesions or rashes. No jaundice  EYES: PERRLA, EOMs are intact. No icterus.   EAR, NOSE, AND THROAT: The oropharynx is clear and moist.   LYMPH NODES: No appreciable observable lymphadenopathy  CHEST: The lungs are clear to auscultation. Normal respiratory effort  HEART: RRR S1 and S2 are normal.  There are no murmurs or extra sounds.  ABDOMEN:  The abdomen is soft and nontender. +BS. There is no hepatosplenomegaly. R buttock with some discomfort with palpation but no bruising.   EXTREMITES: There is no edema or cyanosis. Joints are normal without redness or swelling.  NEURO:  Mental status is normal.  No focal neurologic findings.           -----------------------------  Labs: Reviewed by myself  Lab Results   Component Value Date    WBC 6.0 02/24/2023    RBC 3.74 (L) 02/24/2023    HGB 12.3 02/24/2023    HCT 34.5 02/24/2023    MCV 92.2 02/24/2023    MCHC 35.7 02/24/2023    RDW 13.0 02/24/2023    PLT 153 02/24/2023    MPV 9.4 02/24/2023     Lab Results   Component Value Date    BUN 15 02/24/2023    CREAT 0.58 02/24/2023    CL 103 02/24/2023    NA 140  02/24/2023    K 3.9 02/24/2023    [place] 9.3 02/24/2023    TBILI 0.50 02/24/2023    ALB 4.3 02/24/2023    TP 7.5 02/24/2023    AST 21 02/24/2023    ALK 134 (H) 02/24/2023    BICARB 29 02/24/2023    ALT 24 02/24/2023    GLU 110 (H) 02/24/2023     Pancreatic Polypeptide 07/2017: 837  Chromogranin A 07/2017: 1  CA19-9 07/2017: 8  Alk Phos 11/2018: 176    Colonoscopy    Radiology: Reviewed by myself in conjunction with radiology report  CT Abdomen And Pelvis With Contrast    Result Date: 03/02/2023  IMPRESSION: Overall decreased of metastases to the pancreas, liver, and left adrenal gland Please refer to the separately dictated reports for findings on the concurrently obtained CT chest.     CT Chest With Contrast    Result Date: 03/01/2023  IMPRESSION: No findings of new or progressive intrathoracic metastatic disease. 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 Ancillary findings as above. Please refer to concurrent CT abdomen/pelvis for description of findings below the diaphragm. DOSE STATEMENT: [facility] CT scanners employ modern techniques for CT dose reduction, including protocol review, automatic exposure control, and iterative reconstruction techniques.  These features assure that radiation dose levels in CT are optimized and are consistent with state-of-the-art, low dose CT practice.     03/2020 [facility] MRI Report      04/04/2019 MRI Pancreas    CT Chest 03/27/2019  - No evidence of thoracic metastatic disease    CT A/P 03/24/2019    PET Gallium 68  - Primary pancreatic mass with intense radiotracer uptake and multiple hepatic metastatic lesions    Pathology:    Ki67 3%    Molecular:    PDL1 0%      -----------------------------  Assessment & Plan:  Jenna Bridges is a 67 year old female presenting with metastatic PNET with progression on 30mg  Octreotide Monthly + Lutathera > Lanreotide    We discussed with the patient and their family the natural course of NET. We discussed how many of  these come from GI sources including the pancreas, colon, and stomach. Occasionally they are seen from the lung as well. We discussed that they can be indolent and slow growing or quickly capable of rapid growth and spread. Treatment options for symptom relief include somatostatin analogues. Although fewer than 10% of patients see tumor shrinkage with these drugs, prolonged stable disease is seen in 66% which delays progression.     We discussed that if there was interval growth on the next imaging further options include everolimus, CAPTEM, TKIs (Sunitinib, sorafenib, pazopanib, and cabozantinib) and Lutathera. We will also evaluate the ability to send tissue for NGS today with [facility]    Ultimately we would continue octreotide at this time and add everolimus. We discussed evaluating in 2 months to see if growth stopped or continued. After this we would then recommend either CAPTEM or Lutathera. We discussed the side effects dermatologically of everolimus and instructed the patient to pre-empt the rash with a good face hygiene regimen.     Would have an RV in January to discuss restaging scans.    05/22/2020: We will port her scans in from [facility] and restage now given 3 month interval. She previously had a PET GA68 to follow as well for Lutathera. We discussed increasing octreotide or adding regorafenib/everolimus in addition next. We will schedule her for her 06/2020 injection as well to reduce her diarrheal burden. RV monthly until stability. NGS completed as above and added to chart. 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 We discussed MEN can be used as a screening tool for future cancers and she will talk to them but doesn't want to make decision now.    07/18/2020: pain may be referred from hepatic disease, no obvious right effusion needing thoracentesis. Pain has  resolved and no diarrheal symptoms. Would recommend quick interval scan in 2 months time, mid March. Continue injections of octreotide.    10/17/2020: No symptoms of carcinoid. Tolerating Sandostatin LAR without issues. Labs today (CBC CMP TSH Vit B12). Can use these labs for CT AP scheduled for 5/11. Continue Sandostatin injections and follow up after imaging.     11/07/2020: doing very well, scans show stability. No nutritional deficiencies seen on labs. Continue with therapy regularly each month. Restage in 3-6 months.    03/26/2021: continues to do very well. RUQ pain likely related to either liver capsule stretching or possible anxiety. Stable disease on scans without evidence of carcinoid syndrome. Again discussion on MEN1 and screening for pituitary and thyroid disorders and declines. Also discussed genetics counseling and family but declines as well. Will see her in 2 months with scans in 4 months.     05/29/2021:  Continuing to do very well on current dosage and schedule.  We will restage in 6 months' time.    02/19/2022: CT of chest, abdomen, and pelvis significant for interval increase in liver lesions as well as increased lymphadenopathy and increased sclerotic lesion on T8 vertebral body, but similar size of pancreatic tail mass. Discussed next steps including possible treatments such as everolimus, IR ablation, chemotherapy, and lutathera. Plan to continue octreotide and obtain a PET-copper scan to better characterize size and location of lesions. Will determine additional treatment based on results of PET.     03/19/2022: at this time with PET Cu64 showing diffuse and progressive disease discussed multiple options  for improved control. Discussed biopsy of newly growing area but patient declines at this time. Discussed moving to lanreotide but concern for definitive control, more likely slow growth over time. Does not wish to take a daily pill or oral chemotherapy. As such Lutathera may be a more appropriate  route. It does not disrupt her day to day and allows her to continue with a more effective, targeted approach that appeals to her. Will have the Nuclear Medicine team reach out to her to schedule and work with them to align our octreotide schedule with her treatment.     05/27/2022: will continue with Lutathera every 8 weeks. Will have octreotide tomorrow here at [facility]. Has weight gain and energy. Left rib pain likely from fall and rib pain and MSK bruising but explained it will take months to heal. Discussed at length her chronic treatment regimen is tiresome and its ok to feel stressed or tired or take care of herself over others.     07/29/2022: next PRRT on 08/25/2022 and again 10/20/2022. We will order her PET Cu64 thereafter. Continuing monitoring renal function and hyperkalemia fro mPRRT as well as LFTs. Carcinoid well controlled on interval octreotide. EKGs appropriate s/p PRRT.     09/23/2022:  At this time we will sign off on her last PRRT and order a PET copper to be completed 4 weeks after to permit octreotide washout.  She should not get octreotide prior to this injection as it will block the receptors needed for the imaging.  We will then have her return to discuss the next steps and necessity of additional PRRT, continue with octreotide alone, adding additional oral tablets, or any liver directed therapies.    11/26/2022: will move from PRRT now to Lanreotide monthly. Now will continue with restaging in 3 months to see if improved on scans or not. Improved pain in abdomen. Has weight gain but no intentional increase but acceptable for her.     03/11/2023: Continues on Lanreotide and tolerating well. No pain or symptoms of concern. Reviewed CT CAP with patient- continued response in liver, pancreas mass decreased, and osseous mets stable. CT CAP in 6 months per patient preference. Ordered today with RV Botta following scans.        ICD-10-CM ICD-9-CM   1. 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 Primary pancreatic neuroendocrine tumor  D3A.8 209.69             Therapeutic consent is obtained with all patients prior to the first infusion visit or dose. We discuss alternative treatments, expected side effects, and rare occurrences. Further the patient and I discuss the expected outcomes as well as contact number for anyone on our team.      I personally spent 30 minutes in face-to-face and non face-to-face activities related to the patient's visit today, excluding separately reportable services/procedures. The plan was carefully reviewed verbally with the patient, multiple questions answered to satisfaction, and also affirmed understanding of next steps and follow up plan    Davy Pique, MSN, ANP-BC, AOCNP            -----------------------------  Lab   No orders of the defined types were placed in this encounter.    Imaging   Orders Placed This Encounter   Procedures    CT Abdomen And Pelvis With Contrast    CT Chest With Contrast     Procedures   No orders of the defined types were placed in  this encounter.    Other No orders of the defined types were placed in this encounter.

## 2023-03-10 ENCOUNTER — Ambulatory Visit (HOSPITAL_BASED_OUTPATIENT_CLINIC_OR_DEPARTMENT_OTHER): Payer: Medicare Other | Admitting: Hematology & Oncology

## 2023-03-11 ENCOUNTER — Ambulatory Visit: Payer: Medicare Other | Attending: Nurse Practitioner | Admitting: Nurse Practitioner

## 2023-03-11 VITALS — BP 139/83 | HR 89 | Temp 97.6°F | Resp 16 | Ht 65.16 in | Wt 154.0 lb

## 2023-03-11 DIAGNOSIS — C7B02 Secondary carcinoid tumors of liver: Secondary | ICD-10-CM | POA: Insufficient documentation

## 2023-03-11 DIAGNOSIS — D3A8 Other benign neuroendocrine tumors: Secondary | ICD-10-CM | POA: Insufficient documentation

## 2023-03-17 ENCOUNTER — Ambulatory Visit (HOSPITAL_BASED_OUTPATIENT_CLINIC_OR_DEPARTMENT_OTHER): Payer: Medicare Other

## 2023-03-18 ENCOUNTER — Ambulatory Visit
Admission: RE | Admit: 2023-03-18 | Discharge: 2023-03-18 | Disposition: A | Discharge status: Home / Self Care | Payer: Medicare Other | Attending: Nurse Practitioner | Admitting: Nurse Practitioner

## 2023-03-18 VITALS — BP 140/79 | HR 76 | Temp 97.8°F | Resp 17 | Ht 65.16 in | Wt 153.9 lb

## 2023-03-18 DIAGNOSIS — C787 Secondary malignant neoplasm of liver and intrahepatic bile duct: Secondary | ICD-10-CM | POA: Insufficient documentation

## 2023-03-18 DIAGNOSIS — E340 Carcinoid syndrome: Secondary | ICD-10-CM | POA: Insufficient documentation

## 2023-03-18 DIAGNOSIS — C254 Malignant neoplasm of endocrine pancreas: Secondary | ICD-10-CM | POA: Insufficient documentation

## 2023-03-18 MED ORDER — LANREOTIDE ACETATE 120 MG/0.5ML SC SOLN
120.0000 mg | Freq: Once | SUBCUTANEOUS | Status: AC
Start: 2023-03-18 — End: 2023-03-18
  Administered 2023-03-18: 120 mg via SUBCUTANEOUS
  Filled 2023-03-18: qty 1

## 2023-03-18 NOTE — Interdisciplinary (Signed)
Pt arrived ambulatory to appointment, pt is alert and oriented x4. Pt presents for lanreotide acetate subcutaneous injection to be administered by Eddie Candle, [place]. Pre-administration assessment completed by this RN. Pt denies nausea, vomiting, diarrhea and constipation. Pt denies SOB and cough. Pt states no pain at this time. Pt denies any issues with previous injection or injection site. Ongoing medication teaching provided to pt, no further questions from pt. Angelo, [place] to proceed with injection administration.

## 2023-03-18 NOTE — Interdisciplinary (Signed)
Pre-administration assessment of patient completed by ALE, RN.     03/18/23  0934   BP: 140/79   Pulse: 76   Temp: 97.8 F (36.6 C)   Resp: 17   SpO2: 100%        120 mg LANREOTIDE given SQ to right upper quad. gluteus using aseptic technique.        Patient tolerated injection without adverse effects. Site benign. Band-aid placed.      Patient discharged to home in no apparent distress.   Discharge mode: Ambulatory   Return to clinic in 1 month(s) for LANREOTIDE.   date given and reviewed with treatment plan.      Floreen Comber, [place]  03/18/23  9:45 AM

## 2023-04-13 ENCOUNTER — Encounter (HOSPITAL_BASED_OUTPATIENT_CLINIC_OR_DEPARTMENT_OTHER): Payer: Self-pay | Admitting: Hematology & Oncology

## 2023-04-15 ENCOUNTER — Ambulatory Visit
Admission: RE | Admit: 2023-04-15 | Discharge: 2023-04-15 | Disposition: A | Discharge status: Home / Self Care | Payer: Medicare Other | Attending: Nurse Practitioner | Admitting: Nurse Practitioner

## 2023-04-15 VITALS — BP 125/76 | HR 81 | Temp 97.3°F | Resp 16

## 2023-04-15 DIAGNOSIS — C254 Malignant neoplasm of endocrine pancreas: Secondary | ICD-10-CM | POA: Insufficient documentation

## 2023-04-15 DIAGNOSIS — C787 Secondary malignant neoplasm of liver and intrahepatic bile duct: Secondary | ICD-10-CM | POA: Insufficient documentation

## 2023-04-15 DIAGNOSIS — E3400 Carcinoid syndrome, unspecified: Secondary | ICD-10-CM | POA: Insufficient documentation

## 2023-04-15 MED ORDER — LANREOTIDE ACETATE 120 MG/0.5ML SC SOLN
120.0000 mg | Freq: Once | SUBCUTANEOUS | Status: AC
Start: 2023-04-15 — End: 2023-04-15
  Administered 2023-04-15: 120 mg via SUBCUTANEOUS
  Filled 2023-04-15: qty 1

## 2023-04-15 NOTE — Interdisciplinary (Signed)
Fast Track Nursing Note - [facility]  67 year old female @ [facility] FAST TRACK for Lanreotide injection.     ICD-10-CM ICD-9-CM   1. Carcinoid syndrome  E34.00 259.2   2. 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 Malignant neoplasm of endocrine pancreas (CMS-HCC)  C25.4 157.4     Chief Complaint:  Imm/Inj    Vital Signs:  BP 125/76 (BP Location: Left arm, BP Patient Position: Sitting)   Pulse 81   Temp 97.3 F (36.3 C) (Temporal)   Resp 16   SpO2 98%   Assessment:   Patient denies fever, chills, NVD, shortness of breath, or coughing.     Lanreotide injection given SC in R upper quad glute. Tolerated without issue.     Discharge Plan:  Return in 4 weeks (on 05/13/2023) for Injection.  Discharge Mode: Ambulatory  Discharge Time: 1025  Accompanied by: Self  Discharged To: Home   Treatment Administration  Medications   lanreotide acetate (SOMATULINE DEPOT) depot injection 120 mg (120 mg Subcutaneous Given 04/15/23 1023)

## 2023-04-16 NOTE — Addendum Note (Signed)
Encounter addended by: Erling Cruz on: 04/16/2023 6:46 AM   Actions taken: Charge Capture section accepted

## 2023-05-07 ENCOUNTER — Telehealth (INDEPENDENT_AMBULATORY_CARE_PROVIDER_SITE_OTHER): Payer: Self-pay

## 2023-05-07 ENCOUNTER — Encounter (HOSPITAL_BASED_OUTPATIENT_CLINIC_OR_DEPARTMENT_OTHER): Payer: Self-pay | Admitting: Hematology & Oncology

## 2023-05-07 NOTE — Telephone Encounter (Signed)
Called patient to reschedule LANREOTIDE from 11/21. Due to critical staffing challenges on that day. Patient agreed to reschedule to 11/23.

## 2023-05-13 ENCOUNTER — Ambulatory Visit (HOSPITAL_BASED_OUTPATIENT_CLINIC_OR_DEPARTMENT_OTHER): Admit: 2023-05-13 | Payer: Medicare Other

## 2023-05-15 ENCOUNTER — Ambulatory Visit
Admission: RE | Admit: 2023-05-15 | Discharge: 2023-05-15 | Disposition: A | Discharge status: Home / Self Care | Payer: Medicare Other | Attending: Nurse Practitioner | Admitting: Nurse Practitioner

## 2023-05-15 VITALS — BP 138/78 | HR 76 | Temp 97.5°F | Resp 16 | Ht 65.16 in | Wt 152.8 lb

## 2023-05-15 DIAGNOSIS — E3400 Carcinoid syndrome, unspecified: Secondary | ICD-10-CM | POA: Insufficient documentation

## 2023-05-15 DIAGNOSIS — C254 Malignant neoplasm of endocrine pancreas: Secondary | ICD-10-CM | POA: Insufficient documentation

## 2023-05-15 DIAGNOSIS — C787 Secondary malignant neoplasm of liver and intrahepatic bile duct: Secondary | ICD-10-CM | POA: Insufficient documentation

## 2023-05-15 MED ORDER — LANREOTIDE ACETATE 120 MG/0.5ML SC SOLN
120.0000 mg | Freq: Once | SUBCUTANEOUS | Status: AC
Start: 2023-05-15 — End: 2023-05-15
  Administered 2023-05-15: 120 mg via SUBCUTANEOUS
  Filled 2023-05-15: qty 1

## 2023-05-15 NOTE — Interdisciplinary (Signed)
RN assessment done for lanreotide injection. Patient currently denies nausea, vomiting, diarrhea, constipation. Patient denies any issues with previous injections and has no questions. Jessica LVN to give injection.

## 2023-05-15 NOTE — Addendum Note (Signed)
Encounter addended by: Schuyler Amor, RN on: 05/15/2023 8:47 AM   Actions taken: Clinical Note Signed, Flowsheet accepted

## 2023-05-15 NOTE — Interdisciplinary (Signed)
Pre-administration assessment of patient completed by [facility], RN.     05/15/23  0808   BP: 138/78   Pulse: 76   Temp: 97.5 F (36.4 C)   Resp: 16   SpO2: 98%      OTHER: LANREOTIDE  given IM to right upper quad. abdomen using aseptic technique.    Jenna Bridges tolerated injection without adverse effects. Site benign. Band-aid placed.     Patient discharged to home in no apparent distress.   Discharge mode: Ambulatory   Return to clinic in 1 month(s) for OTHER: LANREOTIDE .   Next appointment date given and reviewed with treatment plan.

## 2023-06-07 ENCOUNTER — Telehealth (HOSPITAL_BASED_OUTPATIENT_CLINIC_OR_DEPARTMENT_OTHER): Payer: Self-pay

## 2023-06-07 NOTE — Telephone Encounter (Signed)
 Dr. Teressa Ferdinand,     Jenna Bridges is scheduled for infusion of Lanreotide on 12/19.     The following items are currently outstanding:    There are no more orders    *As a reminder of our infusion center process, if an unsigned order and/or consent are not addressed 1 business day before the infusion center appointment, the patient appointment will be cancelled.      Thank you,   Allyne Jabs, RN  RN Liaison

## 2023-06-10 ENCOUNTER — Ambulatory Visit
Admission: RE | Admit: 2023-06-10 | Discharge: 2023-06-10 | Disposition: A | Discharge status: Home / Self Care | Payer: Medicare Other | Attending: Nurse Practitioner | Admitting: Nurse Practitioner

## 2023-06-10 VITALS — BP 145/66 | HR 89 | Temp 98.0°F | Resp 18

## 2023-06-10 DIAGNOSIS — C254 Malignant neoplasm of endocrine pancreas: Secondary | ICD-10-CM | POA: Insufficient documentation

## 2023-06-10 DIAGNOSIS — C787 Secondary malignant neoplasm of liver and intrahepatic bile duct: Secondary | ICD-10-CM | POA: Insufficient documentation

## 2023-06-10 DIAGNOSIS — E3400 Carcinoid syndrome, unspecified: Secondary | ICD-10-CM | POA: Insufficient documentation

## 2023-06-10 MED ORDER — LANREOTIDE ACETATE 120 MG/0.5ML SC SOLN
120.0000 mg | Freq: Once | SUBCUTANEOUS | Status: AC
Start: 2023-06-10 — End: 2023-06-10
  Administered 2023-06-10: 120 mg via SUBCUTANEOUS
  Filled 2023-06-10: qty 1

## 2023-06-10 NOTE — Interdisciplinary (Signed)
Patient arrived ambulatory in stable condition.    Pre-administration assessment of patient completed by Rosana Hoes., RN.     06/10/23  1004   BP: 145/66   Pulse: 89   Temp: 98 F (36.7 C)   Resp: 18   SpO2: 97%       Somatuline (lanreotide) injection given deep SQ in the LEFT upper gluteus using aseptic technique. Site benign and band-aid placed. Patient tolerated well.    Patient discharged to home in no apparent distress.   Discharge mode: Ambulatory   Return to clinic in 4 weeks for Somatuline.  Next appointment request submitted.      Medications   lanreotide acetate (SOMATULINE DEPOT) depot injection 120 mg (120 mg Subcutaneous Given 06/10/23 1012)

## 2023-06-10 NOTE — Interdisciplinary (Signed)
Fast Track Nursing Note -  Infusion Center  67 year old female @ [facility] FAST TRACK for lanreotide injection.     ICD-10-CM ICD-9-CM   1. Carcinoid syndrome  E34.00 259.2   2. 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 Malignant neoplasm of endocrine pancreas (CMS-HCC)  C25.4 157.4     Chief Complaint:  Imm/Inj    Vital Signs:  BP 145/66 (BP Location: Left arm, BP Patient Position: Sitting)   Pulse 89   Temp 98 F (36.7 C) (Temporal)   Resp 18   SpO2 97%   Assessment:   Patient denies fever, chills, NVD, pain, shortness of breath, or coughing. Patient has tolerated previous injections well in the past. Patient has no questions about drug or plan of care for today. Injection administered by Marcelino Duster, LVN.     Treatment Administration  Medications   lanreotide acetate (SOMATULINE DEPOT) depot injection 120 mg (has no administration in time range)

## 2023-06-11 NOTE — Addendum Note (Signed)
Encounter addended by: Erling Cruz on: 06/11/2023 9:27 AM   Actions taken: Charge Capture section accepted

## 2023-07-08 ENCOUNTER — Ambulatory Visit
Admission: RE | Admit: 2023-07-08 | Discharge: 2023-07-08 | Disposition: A | Discharge status: Home / Self Care | Payer: Medicare Other | Attending: Nurse Practitioner | Admitting: Nurse Practitioner

## 2023-07-08 VITALS — BP 149/54 | HR 82 | Temp 98.2°F | Resp 16 | Ht 65.16 in | Wt 150.8 lb

## 2023-07-08 DIAGNOSIS — C787 Secondary malignant neoplasm of liver and intrahepatic bile duct: Secondary | ICD-10-CM | POA: Insufficient documentation

## 2023-07-08 DIAGNOSIS — E3400 Carcinoid syndrome, unspecified: Secondary | ICD-10-CM | POA: Insufficient documentation

## 2023-07-08 DIAGNOSIS — C254 Malignant neoplasm of endocrine pancreas: Secondary | ICD-10-CM | POA: Insufficient documentation

## 2023-07-08 MED ORDER — LANREOTIDE ACETATE 120 MG/0.5ML SC SOLN
120.0000 mg | Freq: Once | SUBCUTANEOUS | Status: AC
Start: 2023-07-08 — End: 2023-07-08
  Administered 2023-07-08: 120 mg via SUBCUTANEOUS
  Filled 2023-07-08: qty 1

## 2023-07-08 NOTE — Interdisciplinary (Signed)
 Katheryn Potters presents for LANREOTIDE      Pre-treatment nursing assessment:   Patient arrived by ambulating  accompanied by Self   Vital signs are stable, and the patient is in no apparent distress.     07/08/23  0924   BP: (!) 149/54   Pulse: 82   Temp: 98.2 F (36.8 C)   Resp: 16   SpO2: 100%        Patient denies nausea, vomiting, diarrhea, constipation, or fatigue.      Endorsed care to [place], [place] to administer medication and schedule next appointment. LVN to draw labs per treatment plan if indicated and complete ongoing medication education using approved educational handouts.

## 2023-07-08 NOTE — Addendum Note (Signed)
 Encounter addended by: Ander Gaster, RN on: 07/08/2023 10:02 AM   Actions taken: Clinical Note Signed

## 2023-07-08 NOTE — Interdisciplinary (Signed)
 Pre-administration assessment of patient completed by RJ, RN.     07/08/23  0924   BP: (!) 149/54   Pulse: 82   Temp: 98.2 F (36.8 C)   Resp: 16   SpO2: 100%        120 mg LANREOTIDE given deep SQ to right upper quad. gluteus using aseptic technique.        Patient tolerated injection without adverse effects. Site benign. Band-aid placed.      Patient discharged to home in no apparent distress.   Discharge mode: Ambulatory   Return to clinic in 1 month(s) for LANREOTIDE.   date given and reviewed with treatment plan.  Medications   lanreotide acetate  (SOMATULINE DEPOT ) depot injection 120 mg (120 mg Subcutaneous Given 07/08/23 0936)

## 2023-07-13 NOTE — Addendum Note (Signed)
Encounter addended by: Ulis Rias on: 07/13/2023 1:27 PM   Actions taken: Charge Capture section accepted

## 2023-08-05 ENCOUNTER — Ambulatory Visit (HOSPITAL_BASED_OUTPATIENT_CLINIC_OR_DEPARTMENT_OTHER): Admit: 2023-08-05 | Payer: Medicare Other

## 2023-08-05 ENCOUNTER — Telehealth (HOSPITAL_BASED_OUTPATIENT_CLINIC_OR_DEPARTMENT_OTHER): Payer: Self-pay

## 2023-08-05 ENCOUNTER — Ambulatory Visit
Admission: RE | Admit: 2023-08-05 | Discharge: 2023-08-05 | Disposition: A | Discharge status: Home / Self Care | Payer: Medicare Other | Attending: Nurse Practitioner | Admitting: Nurse Practitioner

## 2023-08-05 VITALS — BP 142/77 | HR 88 | Temp 98.3°F | Resp 17 | Ht 65.0 in | Wt 152.8 lb

## 2023-08-05 DIAGNOSIS — E3400 Carcinoid syndrome, unspecified: Secondary | ICD-10-CM | POA: Insufficient documentation

## 2023-08-05 DIAGNOSIS — C7B02 Secondary carcinoid tumors of liver: Secondary | ICD-10-CM

## 2023-08-05 DIAGNOSIS — C787 Secondary malignant neoplasm of liver and intrahepatic bile duct: Secondary | ICD-10-CM | POA: Insufficient documentation

## 2023-08-05 DIAGNOSIS — C254 Malignant neoplasm of endocrine pancreas: Secondary | ICD-10-CM | POA: Insufficient documentation

## 2023-08-05 MED ORDER — LANREOTIDE ACETATE 120 MG/0.5ML SC SOLN
120.0000 mg | Freq: Once | SUBCUTANEOUS | Status: AC
Start: 2023-08-05 — End: 2023-08-05
  Administered 2023-08-05: 120 mg via SUBCUTANEOUS
  Filled 2023-08-05: qty 1

## 2023-08-05 NOTE — Telephone Encounter (Signed)
Outpatient Oncology RN Narrative    - Spoke with patient, informed that labs are in for CT scans, confirmed CT orders are already in and not expired, no interactions with lanreotide and CT scan contrast, instructed to get labs done no more than 6 weeks prior to CT scans, schedule CT scans, then schedule follow up with Dr. Art Buff, patient verbalized understanding, no further questions

## 2023-08-05 NOTE — Interdisciplinary (Signed)
Nurses Note: Patient arrived ambulatory in stable condition for lanreotide injection.  Vital signs stable and in no apparent distress.  Pt reports no nausea, vomiting, constipation, or diarrhea.  Patient denies daily hot flashes. No joint aches. Pt given ongoing medication teaching.     Orders reviewed with patient for Faslodex and patient consents to treatment.   Lanreotide injection to L gluteus using aseptic technique. No blood upon aspiration. Injections pushed slowly.    Patient tolerated injections. Site benign. Gauze and bandaid placed.  Patient discharged ambulatory to home in no apparent distress.      Next appointment reviewed with patient who verbalized understanding.      Medications   lanreotide acetate (SOMATULINE DEPOT) depot injection 120 mg (has no administration in time range)

## 2023-08-05 NOTE — Telephone Encounter (Signed)
Outpatient Oncology Nursing Narrative    Call to patient to discuss her scan scheduling and follow up.  CT scans already ordered and available for scheduling in her record and follow up due in March per last visit 02/2023. Provided patient scheduling number for follow up appointment once CT scheduled.    Patient asking for clarification regarding when to schedule in relation to her lanreotide injection.    Will discuss with provider team and call patient back. Okay to leave VM if no answer patient reports.    Florentina Addison, RN  Nurse Case Manager working with Theotis Barrio, RN  Davy Pique, NP/Gregory Art Buff, MD

## 2023-08-06 NOTE — Addendum Note (Signed)
Encounter addended by: Ladoris Gene on: 08/06/2023 12:16 PM   Actions taken: Flowsheet accepted, Charge Capture section accepted

## 2023-08-17 ENCOUNTER — Other Ambulatory Visit: Payer: Medicare Other | Attending: Nurse Practitioner

## 2023-08-17 DIAGNOSIS — C7B02 Secondary carcinoid tumors of liver: Secondary | ICD-10-CM | POA: Insufficient documentation

## 2023-08-17 LAB — COMPREHENSIVE METABOLIC PANEL, BLOOD
ALT (SGPT): 36 U/L — ABNORMAL HIGH (ref 0–33)
AST (SGOT): 26 U/L (ref 0–32)
Albumin: 4.8 g/dL (ref 3.5–5.2)
Alkaline Phos: 169 U/L — ABNORMAL HIGH (ref 40–130)
Anion Gap: 10 mmol/L (ref 7–15)
BUN: 9 mg/dL (ref 8–23)
Bicarbonate: 31 mmol/L — ABNORMAL HIGH (ref 22–29)
Bilirubin, Tot: 0.59 mg/dL (ref <1.2)
Calcium: 9.7 mg/dL (ref 8.5–10.6)
Chloride: 100 mmol/L (ref 98–107)
Creatinine: 0.52 mg/dL (ref 0.51–0.95)
Glucose: 132 mg/dL — ABNORMAL HIGH (ref 70–99)
Potassium: 4.2 mmol/L (ref 3.5–5.1)
Sodium: 141 mmol/L (ref 136–145)
Total Protein: 7.9 g/dL (ref 6.0–8.0)
eGFR Based on CKD-EPI 2021 Equation: >60 mL/min/{1.73_m2}

## 2023-08-17 LAB — CBC WITH DIFF, BLOOD
ANC-Automated: 3.5 10*3/uL (ref 1.6–7.0)
ANC-Instrument: 3.5 10*3/uL (ref 1.6–7.0)
Abs Basophils: 0 10*3/uL (ref <0.2)
Abs Eosinophils: 0 10*3/uL (ref 0.0–0.5)
Abs Lymphs: 1.3 10*3/uL (ref 0.8–3.1)
Abs Monos: 0.5 10*3/uL (ref 0.2–0.8)
Basophils: 0.4 %
Eosinophils: 0.6 %
Hct: 40.2 % (ref 34.0–45.0)
Hgb: 13.7 g/dL (ref 11.2–15.7)
Imm Gran %: 0.4 % (ref <1)
Imm Gran Abs: 0 10*3/uL (ref <0.1)
Lymphocytes: 23.6 %
MCH: 31.7 pg (ref 26.0–32.0)
MCHC: 34.1 g/dL (ref 32.0–36.0)
MCV: 93.1 um3 (ref 79.0–95.0)
MPV: 9.5 fL (ref 9.4–12.4)
Monocytes: 9.6 %
Plt Count: 194 10*3/uL (ref 140–370)
RBC: 4.32 10*6/uL (ref 3.90–5.20)
RDW: 12.9 % (ref 12.0–14.0)
Segs: 65.4 %
WBC: 5.3 10*3/uL (ref 4.0–10.0)

## 2023-08-17 LAB — CEA TUMOR, BLOOD: CEA Tumor: 7.6 ng/mL

## 2023-08-18 ENCOUNTER — Telehealth (HOSPITAL_BASED_OUTPATIENT_CLINIC_OR_DEPARTMENT_OTHER): Payer: Self-pay | Admitting: Hematology & Oncology

## 2023-08-18 NOTE — Telephone Encounter (Signed)
 Left patient a voicemail message asking her to call Ana Dr. Leda Roys AA to schedule her follow up visit after having her images.

## 2023-08-24 ENCOUNTER — Ambulatory Visit
Admission: RE | Admit: 2023-08-24 | Discharge: 2023-08-24 | Disposition: A | Discharge status: Home / Self Care | Payer: Medicare Other | Attending: Nurse Practitioner | Admitting: Nurse Practitioner

## 2023-08-24 DIAGNOSIS — C7B02 Secondary carcinoid tumors of liver: Secondary | ICD-10-CM | POA: Insufficient documentation

## 2023-08-24 DIAGNOSIS — D3A8 Other benign neuroendocrine tumors: Secondary | ICD-10-CM | POA: Insufficient documentation

## 2023-08-24 MED ORDER — IOHEXOL 350 MG/ML IV SOLN
500.0000 mL | Freq: Once | INTRAVENOUS | Status: AC
Start: 2023-08-24 — End: 2023-08-24
  Administered 2023-08-24: 09:00:00 125 mL via INTRAVENOUS

## 2023-08-26 ENCOUNTER — Ambulatory Visit: Attending: Hematology & Oncology | Admitting: Hematology & Oncology

## 2023-08-26 VITALS — BP 141/88 | HR 90 | Temp 97.6°F | Resp 16 | Ht 65.0 in | Wt 152.8 lb

## 2023-08-26 DIAGNOSIS — C7B02 Secondary carcinoid tumors of liver: Secondary | ICD-10-CM | POA: Insufficient documentation

## 2023-08-26 DIAGNOSIS — D3A8 Other benign neuroendocrine tumors: Secondary | ICD-10-CM | POA: Insufficient documentation

## 2023-08-26 DIAGNOSIS — E3400 Carcinoid syndrome, unspecified: Secondary | ICD-10-CM | POA: Insufficient documentation

## 2023-08-26 NOTE — Progress Notes (Signed)
 GI Oncology Progress    Name: Jenna Bridges  DOB: 1956/03/18  Age: 68 year old  Sex: female  MRN: [id number]  Date of Service: 08/26/2023      Chief Complaint: PNET    HPI: Jenna Bridges is a 68 year old female presenting with metastatic PNET (Ki67 of 3%) on Octreotide  30mg  monthly until 04/06/2019 when interval scans showed progression per last scan at [facility] transferring all care to [facility] with progression on PRRT > response > lanreotide    History of PNET following with Dr. Kay at [facility] on octreotide  since 08/2017. Previous to this noted watery diarrhea and had increase from 20mg  to 30mg  with good resolution. Has had no abdominal pain or carcinoid symptoms. Recent CT CAP and MRI of pancreas October 2020 found new hepatic lesions and slight interval growth compared with 1 year prior.    She is here to discuss additional treatment options for her and overall prognosis.     05/22/2020: presents alone. States that the [facility] oncology practice is 'closing' and that she wants to transfer care to [facility]. Has report from October that says there is progression on her MRI scans but they continued on octreotide  30mg  as she was clinically well. Otherwise notes diarrhea if her injection is delayed. Is set to get last injection at [facility] on 05/28/2020 then will need 06/2020 injection from that point forward.     07/18/2020: presents alone. Recent ED visit (1/24) with abdominal pain on right abdomen subcostal, severe when lying down. Restaged at that time showing minor right pleural thickening above right hepatic disease. Scattered 4mm nodules.No pain has returned, no carcinoid issues.    10/17/2020:  Presents alone. On monthly Sandostatin  LAR, tolerating well. Denies flushing, abdominal pain, or diarrhea.    11/07/2020: presents alone today. Restaging scans completed with SD. No diarrhea, flushing, pain.    03/26/2021: presents alone today. Stress about scans but stable since 10/2020. OK with three times a year scans. No issues with  carcinoid syndrome. Has RUQ pain stable, believes from stress. Discussion on injection and depot with previous scar tissue.    05/29/2021: presents today alone.  Has no particular symptoms of carcinoid syndrome.  Her last scans done September 2022 show stable disease without any progression.    02/19/2022:   Denies flushing, diarrhea for the past several years (though these were presenting sx in Fall 2018). Reports concerns with injection of sandostatin  - problems with needle breaking and was concerned that she wasn't getting the whole dose because she didn't have an injection site reaction. Also had an episode of significant bruising after last infusion with some continued discomfort to palpation. Last injection 8/15.  Denies fevers, chills, bony pain, abdominal distension, changes in BM, abdominal pain, n/v.     03/19/2022: presents today alone. She had PET Cu64 complete with disease in liver and pancreatic tail with T8 lesion. She has no symptoms and no back pain. A lot of family issues with husband celiac, daughter crohns, daughter in law with cervical dystonia. Would like to know options for treatment. Without diarrhea, flushing, pain.     05/27/2022: tolerated treatment well without issues. No vomiting but had nausea. Went through about 3 bottles of water. Had a syncopal episode at 10pm after getting off the couch and going to the restroom. Husband found her on the floor, no injuries but rib pain on the left. Noted she was next to neuropsych unit and could hear yelling next door. Otherwise tolerated therapy well and  husband took her home without issues.     07/29/2022: presents alone today. Feeling very well with PRRT and no issues with next round. Was prepared with hydration as well as food. Potassium was normal on recheck of labs and LFTs are stable, blood counts stable. Pending follow up PET Cu64     09/23/2022:  Patient presents today in-person.  She is tolerating the PRRT exceptionally well with no particular  fatigue right upper quadrant pain or hyperkalemia.  She is questions regarding restaging scans in steps.    11/26/2022: presents alone today in person. Feeling very well and has had great response on PET Cu64 and CEA downtrending. Daughter setting up a lab at [facility] as geophysicist/field seismologist professor. Eating and drinking very well no abdominal pain.     03/11/2023: Presents alone. CT scans on 9/6 with continued response in liver and pancreas mass, thoracic osseous mets stable. CEA trending down. Continues on monthly Lanreotide. No pain or symptoms. Feels well overall.    08/26/2023: presents in person today. Restaging not completed yet today and stable on my preliminary read. Overall health well, no abdominal pain, no carcinoid syndrome. Daughter opening lab at [facility]. CEA downtrending.       Review of systems: pain from left rib s/p fall resolved, less anxiety with treatment ongoing and very talkative, all other systems reviewed and are negative except for what is described in the history of present illness.    Oncologic Timeline:  09/09/2017-04/06/2019: Octreotide  Qmonthly  04/27/2019: [facility]  03/2020: Per report, progression on MRI, continues on octreotide  30mg  monthly  05/22/2020: F/U Dr. Rexford  07/15/2020: ED visit with pain, CTA negative  07/18/2020: RV Dr. Rexford  2/20/222: MRI AP- hepatic lesions similar in size  10/17/2020: RV Dr Rexford  11/07/2020: RV Dr. Rexford  03/26/2021: RV  05/26/2021: RV  07/27/2020: RV with new staging  02/19/2022: RV with new staging  03/19/2022: plan to move to lutathera   05/05/2022: Lutathera  D1  05/28/2022: Octreotide   06/30/2022: Lutathera  D2  10/20/2022: Lutathera  C4  11/20/2022: PRRT with response, CEA down  11/26/2022: Lanreotide 120 mg    12/24/2022: Lanreotide  01/21/2023:  Lanreotide  02/18/2023:  Lanreotide  02/26/2023: CT CAP: overall continued response in liver and pancreas mass. Stable thorax osseous mets  03/11/2023: RV NP  03/17/2023: Lanreotide  08/26/2023: RV on CT stable,  lanreotide        -----------------------------  Past Medical History:   Past Medical History:   Diagnosis Date    Liver metastases (CMS-HCC) 05/05/2019       Medications:   Current Outpatient Medications:     octreotide  (SANDOSTATIN  LAR) 30 MG injection, 30 mg by IntraMUSCULAR route every 28 days., Disp: , Rfl:     Allergies:   No Known Allergies      Social history:   Alcohol: None  Tobacco: None  Drugs: None  Living Situation: Lives with husband, daughter is a PhD chiropodist at [employer]    Family history:  Father: Parkinson/Lewy Body Dementia  Cousins: breast, brain, colon cancer and lymphoma    -----------------------------  Physical exam:  Vitals:BP 141/88 (BP Location: Left arm, BP Patient Position: Sitting, BP cuff size: Regular)   Pulse 90   Temp 97.6 F (36.4 C) (Temporal)   Resp 16   Ht 5' 5 (1.651 m)   Wt 69.3 kg (152 lb 12.8 oz)   SpO2 98%   BMI 25.43 kg/m   Wt Readings from Last 3 Encounters:   08/26/23  69.3 kg (152 lb 12.8 oz)   08/05/23 69.3 kg (152 lb 12.5 oz)   07/08/23 68.4 kg (150 lb 12.8 oz)     PHYSICAL EXAM    ECOG: 0  GENERAL APPEARANCE: The patient is an alert, cooperative female in no acute distress.  SKIN: No lesions or rashes. No jaundice  EYES: PERRLA, EOMs are intact. No icterus.   EAR, NOSE, AND THROAT: The oropharynx is clear and moist.   LYMPH NODES: No appreciable observable lymphadenopathy  CHEST: The lungs are clear to auscultation. Normal respiratory effort  HEART: RRR S1 and S2 are normal.  There are no murmurs or extra sounds.  ABDOMEN:  The abdomen is soft and nontender. +BS. There is no hepatosplenomegaly. R buttock with some discomfort with palpation but no bruising.   EXTREMITES: There is no edema or cyanosis. Joints are normal without redness or swelling.  NEURO:  Mental status is normal.  No focal neurologic findings.           -----------------------------  Labs: Reviewed by myself  Lab Results   Component Value Date    WBC 5.3 08/17/2023    RBC 4.32  08/17/2023    HGB 13.7 08/17/2023    HCT 40.2 08/17/2023    MCV 93.1 08/17/2023    MCHC 34.1 08/17/2023    RDW 12.9 08/17/2023    PLT 194 08/17/2023    MPV 9.5 08/17/2023     Lab Results   Component Value Date    BUN 9 08/17/2023    CREAT 0.52 08/17/2023    CL 100 08/17/2023    NA 141 08/17/2023    K 4.2 08/17/2023    CA 9.7 08/17/2023    TBILI 0.59 08/17/2023    ALB 4.8 08/17/2023    TP 7.9 08/17/2023    AST 26 08/17/2023    ALK 169 (H) 08/17/2023    BICARB 31 (H) 08/17/2023    ALT 36 (H) 08/17/2023    GLU 132 (H) 08/17/2023     Pancreatic Polypeptide 07/2017: 837  Chromogranin A 07/2017: 1  CA19-9 07/2017: 8  Alk Phos 11/2018: 176    Colonoscopy    Radiology: Reviewed by myself in conjunction with radiology report  No results found.     03/2020 [facility] Report      04/04/2019 MRI Pancreas    CT Chest 03/27/2019  - No evidence of thoracic metastatic disease    CT A/P 03/24/2019    PET Gallium 68  - Primary pancreatic mass with intense radiotracer uptake and multiple hepatic metastatic lesions    Pathology:    Ki67 3%    Molecular:    PDL1 0%      -----------------------------  Assessment & Plan:  Alahni Varone is a 68 year old female presenting with metastatic PNET with progression on 30mg  Octreotide  Monthly + Lutathera  > Lanreotide    We discussed with the patient and their family the natural course of NET. We discussed how many of these come from GI sources including the pancreas, colon, and stomach. Occasionally they are seen from the lung as well. We discussed that they can be indolent and slow growing or quickly capable of rapid growth and spread. Treatment options for symptom relief include somatostatin analogues. Although fewer than 10% of patients see tumor shrinkage with these drugs, prolonged stable disease is seen in 66% which delays progression.     We discussed that if there was interval growth on the next imaging further options include everolimus,  CAPTEM, TKIs (Sunitinib, sorafenib, pazopanib, and  cabozantinib) and Lutathera . We will also evaluate the ability to send tissue for NGS today with [facility]    Ultimately we would continue octreotide  at this time and add everolimus. We discussed evaluating in 2 months to see if growth stopped or continued. After this we would then recommend either CAPTEM or Lutathera . We discussed the side effects dermatologically of everolimus and instructed the patient to pre-empt the rash with a good face hygiene regimen.     Would have an RV in January to discuss restaging scans.    05/22/2020: We will port her scans in from [facility] and restage now given 3 month interval. She previously had a PET GA68 to follow as well for Lutathera . We discussed increasing octreotide  or adding regorafenib/everolimus in addition next. We will schedule her for her 06/2020 injection as well to reduce her diarrheal burden. RV monthly until stability. NGS completed as above and added to chart. 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 We discussed MEN can be used as a screening tool for future cancers and she will talk to them but doesn't want to make decision now.    07/18/2020: pain may be referred from hepatic disease, no obvious right effusion needing thoracentesis. Pain has resolved and no diarrheal symptoms. Would recommend quick interval scan in 2 months time, mid March. Continue injections of octreotide .    10/17/2020: No symptoms of carcinoid. Tolerating Sandostatin  LAR without issues. Labs today (CBC CMP TSH Vit B12). Can use these labs for CT AP scheduled for 5/11. Continue Sandostatin  injections and follow up after imaging.     11/07/2020: doing very well, scans show stability. No nutritional deficiencies seen on labs. Continue with therapy regularly each month. Restage in 3-6 months.    03/26/2021: continues to do very well. RUQ pain likely related to  either liver capsule stretching or possible anxiety. Stable disease on scans without evidence of carcinoid syndrome. Again discussion on MEN1 and screening for pituitary and thyroid  disorders and declines. Also discussed genetics counseling and family but declines as well. Will see her in 2 months with scans in 4 months.     05/29/2021:  Continuing to do very well on current dosage and schedule.  We will restage in 6 months' time.    02/19/2022: CT of chest, abdomen, and pelvis significant for interval increase in liver lesions as well as increased lymphadenopathy and increased sclerotic lesion on T8 vertebral body, but similar size of pancreatic tail mass. Discussed next steps including possible treatments such as everolimus, IR ablation, chemotherapy, and lutathera . Plan to continue octreotide  and obtain a PET-copper  scan to better characterize size and location of lesions. Will determine additional treatment based on results of PET.     03/19/2022: at this time with PET Cu64 showing diffuse and progressive disease discussed multiple options for improved control. Discussed biopsy of newly growing area but patient declines at this time. Discussed moving to lanreotide but concern for definitive control, more likely slow growth over time. Does not wish to take a daily pill or oral chemotherapy. As such Lutathera  may be a more appropriate route. It does not disrupt her day to day and allows her to continue with a more effective, targeted approach that appeals to her. Will have the Nuclear Medicine team reach out to her to schedule and work  with them to align our octreotide  schedule with her treatment.     05/27/2022: will continue with Lutathera  every 8 weeks. Will have octreotide  tomorrow here at [facility]. Has weight gain and energy. Left rib pain likely from fall and rib pain and MSK bruising but explained it will take months to heal. Discussed at length her chronic treatment regimen is tiresome and its ok to feel stressed  or tired or take care of herself over others.     07/29/2022: next PRRT on 08/25/2022 and again 10/20/2022. We will order her PET Cu64 thereafter. Continuing monitoring renal function and hyperkalemia fro mPRRT as well as LFTs. Carcinoid well controlled on interval octreotide . EKGs appropriate s/p PRRT.     09/23/2022:  At this time we will sign off on her last PRRT and order a PET copper  to be completed 4 weeks after to permit octreotide  washout.  She should not get octreotide  prior to this injection as it will block the receptors needed for the imaging.  We will then have her return to discuss the next steps and necessity of additional PRRT, continue with octreotide  alone, adding additional oral tablets, or any liver directed therapies.    11/26/2022: will move from PRRT now to Lanreotide monthly. Now will continue with restaging in 3 months to see if improved on scans or not. Improved pain in abdomen. Has weight gain but no intentional increase but acceptable for her.     08/26/2023: Continues on Lanreotide and tolerating well. No pain or symptoms of concern. Reviewed CT CAP with patient- continued response in liver, pancreas mass decreased, and osseous mets stable. CT CAP in 6 months per patient preference.         ICD-10-CM ICD-9-CM   1. 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 Primary pancreatic neuroendocrine tumor (CMS-HCC)  D3A.8 209.69   3. Carcinoid syndrome  E34.00 259.2               Therapeutic consent is obtained with all patients prior to the first infusion visit or dose. We discuss alternative treatments, expected side effects, and rare occurrences. Further the patient and I discuss the expected outcomes as well as contact number for anyone on our team.      I personally spent 30 minutes in face-to-face and non face-to-face activities related to the patient's visit today, excluding separately reportable services/procedures. The plan was carefully reviewed verbally with the  patient, multiple questions answered to satisfaction, and also affirmed understanding of next steps and follow up plan    Cordella Pion MD/PhD          -----------------------------  Lab   Orders Placed This Encounter   Procedures    CBC w/ Diff Lavender    CEA Tumor - See Instructions    Comprehensive Metabolic Panel     Imaging   Orders Placed This Encounter   Procedures    PET/CT Cu64 Dotatate Skull To Mid Thigh (Non Diag CT For [facility])     Procedures   No orders of the defined types were placed in this encounter.    Other No orders of the defined types were placed in this encounter.

## 2023-08-26 NOTE — Patient Instructions (Addendum)
 - Please contact PET/CT or PET scheduler: [phone] to schedule an appointment in about one month  - Return for follow up after imaging    _____________________________________________________________________    Your Team:      PHYSICIAN: Cordella Pion, MD PhD      NURSE PRACTITIONER: Lauraine Level, NP       NURSE CASE MANAGER: Hendrick Familia RN       ADMINISTRATIVE ASSISTANT: Shasta Billet (Paperwork, obtaining imaging, scheduling)  Phone: [phone]  Fax: [phone]    PATIENT NAVIGATOR: Hamza Ahmed   Phone: [phone]     *IF you are receiving infusions, please check with the infusion center for a code to enable you to receive free parking on infusion days. Please call [phone] Option 2, or stop at the front desk on your way out. These codes change monthly*                                                                                                                                                   Contacting your Cancer Care Team:    For general healthcare questions, call your nurse/care team: [phone], 8 a.m. -4:30 p.m., Monday through Friday      If you are unable to reach your nurse AND you have urgent symptoms, call Same-Day Cancer Care [phone], 8 a.m. - 9 p.m., Monday through Friday      After hours, for urgent symptoms contact [phone] and ask for the on-call oncologist.      For medical emergencies, please call 911                                                                                                                                                         The following link is a video that will help explain choosing a healthcare proxy and the process of advanced care planning.   https://www.emmisolutions.com/program-preview-who-speaks-for-you/    [facility] Phone List for Patients     Clinic Hours of Operation: Monday-Friday 8:00- 4:30 p.m. (closed holidays and weekends)   Infusion Center Hours: Monday-Friday 7:00-9:30 p.m.; Sat-Sun 8:00-4:30pm.      Social Worker:   GI cancers: Rea  Jerene HUGHS, [phone]  Pancreas cancer: Silvano Pickup, HUGHS, [phone]  For emotional, social, spiritual and practical needs that may arise throughout cancer treatment and recovery.    Information Desk/Call Center: [phone]   ** General information: directions, telephone numbers, or available services     [facility]: [phone], option 2  ** Call to schedule, cancel, or reschedule chemotherapy appointments   [facility] Scheduling: [phone]  [facility] Scheduling: [phone]    Radiation Oncology: [phone]   ** Call to schedule, cancel, or reschedule radiation appointments     Interventional Radiology: [phone]      Financial Counselor: Wendee Irish [phone]   [email or web address]   [phone] is the centralized billing and insurance information number with a team of knowledgeable representatives ready to help M-F 9am to 6pm.     Registration: [phone] (to update personal information)    [facility]:   Ph:[phone]  Fax:[phone]  Open M-F 9-6pm    Radiology Scheduling: [phone]  For PET scans: [phone]    Medical Records: [phone]  Fax: [phone]    Chemotherapy Online Education:  Cecille to recorded Chemotherapy & Immunotherapy Education Class: [email or web address]     Patient and Family Resource Center:  The Patient and Family Resource Center [facility]) offers the most up-to-date cancer information to patients, families, and the community. Various comfort items such as lap blankets, pillows, hats, and referrals for wigs are also available.  Open daily; hours vary--staffed by knowledgeable patient navigators and trained volunteers  Location: [facility], Room 1066, on the first floor just to the left of the lobby elevators  Phone [phone]      [facility]  [place] para Pacientes y sus Familias:  El Cent[facility] de Recursos para Pacientes y Secondary School Teacher (Patient and [employer])  Ofrece a pacientes, familiares, and la comunidad en general, informacin actualizada sobre cncer.   Tambin ah artculos disponibles como pequeas cobijas, almohadas, sombreros, y referencias para pelucas.   Abierto de Lunes a Domingo; horario varea--personal asignado al centro consiste de Navegadores para Pacientes (Patient Navigators) y voluntarios.  Localizado en Centro de Cncer: [facility]), numero de cuarto [phone],  en el primer piso, al lado izquierdo del elevador junto la sala de espera   Nmero de telfono: [phone]     .............................................................................................................................................    Patient Experience Department    [facility]'s Patient Experience Department is here to assist you and your family to ensure that your experience with us  is a positive one. Please tell us  about your experience by contacting us  at [phone], or at [email or web address].  Your feedback will be kept confidential.    *If you get a bill from a molecular testing company [facility]  Medicine, [place], [place], [employer], [facility], [employer]) please let us  know before you pay. Our staff can help resolve this.

## 2023-09-02 ENCOUNTER — Ambulatory Visit
Admission: RE | Admit: 2023-09-02 | Discharge: 2023-09-02 | Disposition: A | Discharge status: Home / Self Care | Attending: Hematology & Oncology | Admitting: Hematology & Oncology

## 2023-09-02 VITALS — BP 131/74 | HR 86 | Temp 97.5°F | Resp 14 | Ht 65.16 in | Wt 151.2 lb

## 2023-09-02 DIAGNOSIS — E3400 Carcinoid syndrome, unspecified: Secondary | ICD-10-CM | POA: Insufficient documentation

## 2023-09-02 DIAGNOSIS — C787 Secondary malignant neoplasm of liver and intrahepatic bile duct: Secondary | ICD-10-CM

## 2023-09-02 DIAGNOSIS — C7B02 Secondary carcinoid tumors of liver: Secondary | ICD-10-CM | POA: Insufficient documentation

## 2023-09-02 DIAGNOSIS — C254 Malignant neoplasm of endocrine pancreas: Secondary | ICD-10-CM | POA: Insufficient documentation

## 2023-09-02 LAB — COMPREHENSIVE METABOLIC PANEL, BLOOD
ALT (SGPT): 32 U/L (ref 0–33)
AST (SGOT): 25 U/L (ref 0–32)
Albumin: 4.7 g/dL (ref 3.5–5.2)
Alkaline Phos: 178 U/L — ABNORMAL HIGH (ref 40–130)
Anion Gap: 11 mmol/L (ref 7–15)
BUN: 12 mg/dL (ref 8–23)
Bicarbonate: 28 mmol/L (ref 22–29)
Bilirubin, Tot: 0.43 mg/dL (ref <1.2)
Calcium: 10 mg/dL (ref 8.5–10.6)
Chloride: 101 mmol/L (ref 98–107)
Creatinine: 0.49 mg/dL — ABNORMAL LOW (ref 0.51–0.95)
Glucose: 157 mg/dL — ABNORMAL HIGH (ref 70–99)
Potassium: 3.9 mmol/L (ref 3.5–5.1)
Sodium: 140 mmol/L (ref 136–145)
Total Protein: 8 g/dL (ref 6.0–8.0)
eGFR Based on CKD-EPI 2021 Equation: >60 mL/min/{1.73_m2}

## 2023-09-02 LAB — CBC WITH DIFF, BLOOD
ANC-Automated: 3.8 10*3/uL (ref 1.6–7.0)
ANC-Instrument: 3.8 10*3/uL (ref 1.6–7.0)
Abs Basophils: 0 10*3/uL (ref <0.2)
Abs Eosinophils: 0 10*3/uL (ref 0.0–0.5)
Abs Lymphs: 1.2 10*3/uL (ref 0.8–3.1)
Abs Monos: 0.5 10*3/uL (ref 0.2–0.8)
Basophils: 0.4 %
Eosinophils: 0.7 %
Hct: 39.4 % (ref 34.0–45.0)
Hgb: 13.5 g/dL (ref 11.2–15.7)
Imm Gran %: 0.4 % (ref <1)
Imm Gran Abs: 0 10*3/uL (ref <0.1)
Lymphocytes: 21.1 %
MCH: 31.9 pg (ref 26.0–32.0)
MCHC: 34.3 g/dL (ref 32.0–36.0)
MCV: 93.1 um3 (ref 79.0–95.0)
MPV: 9.2 fL — ABNORMAL LOW (ref 9.4–12.4)
Monocytes: 8.7 %
Plt Count: 207 10*3/uL (ref 140–370)
RBC: 4.23 10*6/uL (ref 3.90–5.20)
RDW: 12.9 % (ref 12.0–14.0)
Segs: 68.7 %
WBC: 5.5 10*3/uL (ref 4.0–10.0)

## 2023-09-02 LAB — CEA TUMOR, BLOOD: CEA Tumor: 8.3 ng/mL

## 2023-09-02 MED ORDER — LANREOTIDE ACETATE 120 MG/0.5ML SC SOLN
120.0000 mg | Freq: Once | SUBCUTANEOUS | Status: AC
Start: 2023-09-02 — End: 2023-09-02
  Administered 2023-09-02: 10:00:00 120 mg via SUBCUTANEOUS
  Filled 2023-09-02: qty 1

## 2023-09-02 NOTE — Interdisciplinary (Signed)
 Nurses Note: Patient arrived ambulatory in stable condition for lanreotide injection.  Vital signs stable and in no apparent distress.  Pt reports no nausea, vomiting, constipation, or diarrhea.  Patient denies daily hot flashes. No joint aches. Pt given ongoing medication teaching.     Lanreotide injection to RIGHT gluteus using aseptic technique. No blood upon aspiration. Injections pushed slowly.    Patient tolerated injections. Site benign. Gauze and bandaid placed.    Labs drawn per order via butterfly and sent.    Patient discharged ambulatory to home in no apparent distress.      Next appointment reviewed with patient who verbalized understanding.      Medications   lanreotide acetate  (SOMATULINE DEPOT ) depot injection 120 mg (120 mg Subcutaneous Given 09/02/23 1021)

## 2023-09-03 NOTE — Addendum Note (Signed)
 Encounter addended by: Abe Hodgkins on: 09/03/2023 9:36 AM   Actions taken: Charge Capture section accepted

## 2023-09-30 ENCOUNTER — Ambulatory Visit: Admit: 2023-09-30 | Discharge: 2023-09-30

## 2023-09-30 VITALS — BP 136/78 | HR 77 | Temp 98.4°F | Resp 16 | Ht 65.16 in | Wt 150.4 lb

## 2023-09-30 DIAGNOSIS — C254 Malignant neoplasm of endocrine pancreas: Secondary | ICD-10-CM | POA: Insufficient documentation

## 2023-09-30 DIAGNOSIS — E3400 Carcinoid syndrome, unspecified: Secondary | ICD-10-CM | POA: Insufficient documentation

## 2023-09-30 DIAGNOSIS — C787 Secondary malignant neoplasm of liver and intrahepatic bile duct: Secondary | ICD-10-CM | POA: Insufficient documentation

## 2023-09-30 MED ORDER — LANREOTIDE ACETATE 120 MG/0.5ML SC SOLN
120.0000 mg | Freq: Once | SUBCUTANEOUS | Status: AC
Start: 2023-09-30 — End: 2023-09-30
  Administered 2023-09-30: 120 mg via SUBCUTANEOUS
  Filled 2023-09-30: qty 1

## 2023-09-30 NOTE — Interdisciplinary (Signed)
 Jenna Bridges presents for LANREOTIDE      Pre-treatment nursing assessment:   Patient arrived by ambulating.   Vital signs are stable, and the patient is in no apparent distress.    Pt reports "scar tissue like bumps" in the injection sites from many years of getting the injection.  Pt instructed to apply warm compress and massage sites to help break apart and to report to clinic if no relief.   09/30/23  0939   BP: 136/78   Pulse: 77   Temp: 98.4 F (36.9 C)   Resp: 16   SpO2: 100%        Patient denies nausea, vomiting, diarrhea, constipation, or fatigue.      Endorsed care to Dema Filler., LVN to administer medication and schedule next appointment. LVN to draw labs per treatment plan if indicated and complete ongoing medication education using approved educational handouts.

## 2023-09-30 NOTE — Addendum Note (Signed)
 Encounter addended by: Seville Brick, RN on: 09/30/2023 10:19 AM   Actions taken: Flowsheet accepted, Clinical Note Signed

## 2023-09-30 NOTE — Interdisciplinary (Signed)
 Patient in for Lanreotide.      Vital signs stable and Pt in no apparent distress.    Pt reports no nausea, vomiting, constipation, or diarrhea.    Pt given ongoing medication teaching and advised to seek medical attention if side effect occur.     Pt aware of precautions.    Assessment completed by  Marvella Slight, RN prior to injection.      Lanreotide administered subcutaneous to Left gluteus and bandage placed.  Patient tolerated injection.     Medications   lanreotide acetate (SOMATULINE DEPOT) depot injection 120 mg (120 mg Subcutaneous Given 09/30/23 0950)       Patient discharged ambulatory to waiting room.    Lawrence Pretty, [place]  09/30/23

## 2023-10-03 NOTE — Addendum Note (Signed)
 Encounter addended by: Abe Hodgkins on: 10/03/2023 9:20 AM   Actions taken: Charge Capture section accepted

## 2023-10-28 ENCOUNTER — Ambulatory Visit
Admission: RE | Admit: 2023-10-28 | Discharge: 2023-10-28 | Disposition: A | Discharge status: Home / Self Care | Attending: Hematology & Oncology | Admitting: Hematology & Oncology

## 2023-10-28 VITALS — BP 144/67 | HR 84 | Temp 97.4°F | Resp 16 | Ht 65.16 in | Wt 150.6 lb

## 2023-10-28 DIAGNOSIS — C787 Secondary malignant neoplasm of liver and intrahepatic bile duct: Secondary | ICD-10-CM | POA: Insufficient documentation

## 2023-10-28 DIAGNOSIS — C254 Malignant neoplasm of endocrine pancreas: Secondary | ICD-10-CM | POA: Insufficient documentation

## 2023-10-28 DIAGNOSIS — E3400 Carcinoid syndrome, unspecified: Secondary | ICD-10-CM | POA: Insufficient documentation

## 2023-10-28 MED ORDER — LANREOTIDE ACETATE 120 MG/0.5ML SC SOLN
120.0000 mg | Freq: Once | SUBCUTANEOUS | Status: AC
Start: 2023-10-28 — End: 2023-10-28
  Administered 2023-10-28: 120 mg via SUBCUTANEOUS
  Filled 2023-10-28: qty 1

## 2023-10-28 NOTE — Interdisciplinary (Signed)
 Raynette Cam presents for LANREOTIDE      Pre-treatment nursing assessment:   Patient arrived by ambulating  accompanied by Self   Vital signs are stable, and the patient is in no apparent distress.     10/28/23  1030   BP: 144/67   Pulse: 84   Temp: 97.4 F (36.3 C)   Resp: 16   SpO2: 97%        Patient denies nausea, vomiting, diarrhea, constipation, or fatigue.   Med education provided.   R buttocks site prepped and Lanreotide injected per order without incident. Site WNL, bandaid applied, pt tolerated well. VSS and pt d/c in stable condition home  F/U discussed and confirmed       Medications   lanreotide acetate  (SOMATULINE DEPOT ) depot injection 120 mg (120 mg Subcutaneous Given 10/28/23 1041)

## 2023-11-02 NOTE — Addendum Note (Signed)
 Encounter addended by: Gillermo Lack on: 11/02/2023 8:38 AM   Actions taken: Charge Capture section accepted

## 2023-11-25 ENCOUNTER — Ambulatory Visit
Admission: RE | Admit: 2023-11-25 | Discharge: 2023-11-25 | Disposition: A | Discharge status: Home / Self Care | Attending: Hematology & Oncology | Admitting: Hematology & Oncology

## 2023-11-25 DIAGNOSIS — E3400 Carcinoid syndrome, unspecified: Secondary | ICD-10-CM | POA: Insufficient documentation

## 2023-11-25 DIAGNOSIS — C787 Secondary malignant neoplasm of liver and intrahepatic bile duct: Secondary | ICD-10-CM | POA: Insufficient documentation

## 2023-11-25 DIAGNOSIS — C254 Malignant neoplasm of endocrine pancreas: Secondary | ICD-10-CM | POA: Insufficient documentation

## 2023-11-25 MED ORDER — LANREOTIDE ACETATE 120 MG/0.5ML SC SOLN
120.0000 mg | Freq: Once | SUBCUTANEOUS | Status: AC
Start: 2023-11-25 — End: 2023-11-25
  Administered 2023-11-25: 120 mg via SUBCUTANEOUS
  Filled 2023-11-25: qty 1

## 2023-11-25 NOTE — Interdisciplinary (Signed)
 Pre-administration assessment of patient completed by OK, RN.    There were no vitals filed for this visit.     120 mg LANREOTIDE given IM to left upper quad. gluteus using aseptic technique.        Patient tolerated injection without adverse effects. Site benign. Band-aid placed.      Patient discharged to home in no apparent distress.   Discharge mode: Ambulatory   Return to clinic in 1 month(s) for LANREOTIDE.   date given and reviewed with treatment plan.    Medications   lanreotide acetate  (SOMATULINE DEPOT ) depot injection 120 mg (120 mg Subcutaneous Given 11/25/23 1052)     Rudell Overland, LVN

## 2023-11-25 NOTE — Interdisciplinary (Addendum)
 Jenna Bridges presents for LANREOTIDE      Pre-treatment nursing assessment:   Patient arrived by ambulating  accompanied by Self   Vital signs are stable, and the patient is in no apparent distress.    There were no vitals filed for this visit.     Patient denies nausea, vomiting, diarrhea, constipation, or fatigue.      Endorsed care to [place], [place] to administer medication and schedule next appointment. LVN to draw labs per treatment plan if indicated and complete ongoing medication education using approved educational handouts.

## 2023-11-30 ENCOUNTER — Encounter (HOSPITAL_BASED_OUTPATIENT_CLINIC_OR_DEPARTMENT_OTHER): Payer: Self-pay | Admitting: Hematology & Oncology

## 2023-11-30 NOTE — Addendum Note (Signed)
 Encounter addended by: Gjergji, Elona on: 11/30/2023 9:37 AM   Actions taken: Charge Capture section accepted

## 2023-12-06 ENCOUNTER — Telehealth (HOSPITAL_BASED_OUTPATIENT_CLINIC_OR_DEPARTMENT_OTHER): Payer: Self-pay | Admitting: Hematology & Oncology

## 2023-12-06 NOTE — Telephone Encounter (Signed)
 Patient called and wanted to inform Dr. 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

## 2023-12-07 NOTE — Telephone Encounter (Signed)
 Called patient back to check in.    She reports she is not so concerned about the blood pressure as she is not symptomatic (no dizziness, lightheadedness). She reports her BP was last 106 systolic (couldn't remember the diastolic but she says it was normal). Advised to continue to monitor BP at home.    Regarding the neuropathy, she reports that on Friday 6/14 she started having tingling in her feet. The feeling comes and goes throughout the day but she has never had this symptom before. She is wondering if it would be side effect of the lanreotide. Informed her I would report her new symptom to Dr. Rexford and have Hendrick follow up.

## 2023-12-10 ENCOUNTER — Telehealth (HOSPITAL_BASED_OUTPATIENT_CLINIC_OR_DEPARTMENT_OTHER): Payer: Self-pay

## 2023-12-10 NOTE — Telephone Encounter (Signed)
 [facility] Patient Navigation Follow-up Note         Reason for Follow-up    Received referral from oncology nurse    Narrative      Spoke with patient and reminded her to schedule PET scan ordered by Dr Rexford.      Intervention/Assistance:      Patient Navigator provided following assistance:    Provided patient Imaging phone number to schedule PET scan     Patient Navigator   Seward Dine

## 2023-12-23 ENCOUNTER — Ambulatory Visit
Admission: RE | Admit: 2023-12-23 | Discharge: 2023-12-23 | Disposition: A | Discharge status: Home / Self Care | Attending: Hematology & Oncology | Admitting: Hematology & Oncology

## 2023-12-23 VITALS — BP 130/79 | HR 84 | Temp 97.8°F | Resp 16 | Ht 65.16 in | Wt 149.5 lb

## 2023-12-23 DIAGNOSIS — C787 Secondary malignant neoplasm of liver and intrahepatic bile duct: Secondary | ICD-10-CM | POA: Insufficient documentation

## 2023-12-23 DIAGNOSIS — C254 Malignant neoplasm of endocrine pancreas: Secondary | ICD-10-CM | POA: Insufficient documentation

## 2023-12-23 DIAGNOSIS — E3400 Carcinoid syndrome, unspecified: Secondary | ICD-10-CM | POA: Insufficient documentation

## 2023-12-23 MED ORDER — LANREOTIDE ACETATE 120 MG/0.5ML SC SOLN
120.0000 mg | Freq: Once | SUBCUTANEOUS | Status: AC
Start: 2023-12-23 — End: 2023-12-23
  Administered 2023-12-23: 120 mg via SUBCUTANEOUS
  Filled 2023-12-23: qty 1

## 2023-12-23 NOTE — Interdisciplinary (Signed)
 Nurses Note: Patient in for LANREOTIDE injection.  Vital signs stable and in no apparent distress.  Pt reports no nausea, vomiting, constipation, or diarrhea.  Pt given ongoing medication teaching.    LANREOTIDE 120MG  given IM.  Patient tolerated injection.  Patient discharged to home in no apparent distress.

## 2023-12-28 ENCOUNTER — Ambulatory Visit (HOSPITAL_BASED_OUTPATIENT_CLINIC_OR_DEPARTMENT_OTHER): Admitting: Nurse Practitioner

## 2024-01-06 NOTE — Patient Instructions (Addendum)
 Continue Lanreotide injections monthly.  Please call [facility] scheduling at [phone] to reschedule.  PET scheduling number is [phone].  Dotatate PET on 9/10 and follow up with Dr Rexford on 9/17 as scheduled.        _____________________________________________________________________    Your Team:      PHYSICIAN: Cordella Rexford, MD PhD      NURSE PRACTITIONER: Lauraine Level, NP. Lauraine is available Mon-Thursday and out of the office on Fridays.       NURSE CASE MANAGER: Hendrick Familia RN     Contacting your Cancer Care Team:  Hendrick Familia RN  For general healthcare questions, call your nurse/care team: [phone], 8 a.m. -4:30 p.m., Monday through Friday      If you are unable to reach your nurse AND you have urgent symptoms, call Same-Day Cancer Care [phone], 8 a.m. - 9 p.m., Monday through Friday      After hours, for urgent symptoms contact [phone] and ask for the on-call oncologist.      For medical emergencies, please call 911        ADMINISTRATIVE ASSISTANT: Shasta Billet (Paperwork, obtaining imaging, scheduling)  Phone: [phone] option #4  Fax: [phone]    PATIENT NAVIGATOR: Hamza Ahmed   Phone: [phone]     *IF you are receiving infusions, please check with the infusion center for a code to enable you to receive free parking on infusion days. Please call [phone] Option 2, or stop at the front desk on your way out. These codes change monthly*                                                                                                                                                   The following link is a video that will help explain choosing a healthcare proxy and the process of advanced care planning.   https://www.emmisolutions.com/program-preview-who-speaks-for-you/    [facility] Phone List for Patients     Clinic Hours of Operation: Monday-Friday 8:00- 4:30 p.m. (closed holidays and weekends)   Infusion Center Hours: Monday-Friday 7:00-9:30  p.m.; Sat-Sun 8:00-4:30pm.     Social Worker:   GI cancers: Rea Clare, LCSW, [phone]  Pancreas cancer: Silvano Pickup, LCSW, [phone]  For emotional, social, spiritual and practical needs that may arise throughout cancer treatment and recovery.    Information Desk/Call Center: [phone]   ** General information: directions, telephone numbers, or available services     [facility]: [phone], option 2  ** Call to schedule, cancel, or reschedule chemotherapy appointments   [facility] Scheduling: [phone]  [facility] Scheduling: [phone]    Radiation Oncology: [phone]   ** Call to schedule, cancel, or reschedule radiation appointments     Interventional Radiology: [phone]  Financial Counselor: Wendee Irish [phone]   [email or web address]   [phone] is the centralized billing and insurance information number with a team of knowledgeable representatives ready to help M-F 9am to 6pm.     Registration: [phone] (to update personal information)    [facility]:   [phone]  Fax:[phone]  Open M-F 9-6pm    Radiology Scheduling: [phone]  For PET scans: [phone]    Medical Records: [phone]  Fax: [phone]    Chemotherapy Online Education:  Cecille to recorded Chemotherapy & Immunotherapy Education Class: [email or web address]     Patient and Family Resource Center:  The Patient and Family Resource Center [facility]) offers the most up-to-date cancer information to patients, families, and the community. Various comfort items such as lap blankets, pillows, hats, and referrals for wigs are also available.  Open daily; hours vary--staffed by knowledgeable patient navigators and trained volunteers  Location: [facility], Room 1066, on the first floor just to the left of the lobby  elevators  Phone [phone]      [facility] [place] para Pacientes y sus Familias:  El Centro de Recursos para Pacientes y Secondary school teacher (Patient and [employer])  Ofrece a pacientes, familiares, and la comunidad en general, informacin actualizada sobre cncer.   Tambin ah artculos disponibles como pequeas cobijas, almohadas, sombreros, y referencias para pelucas.   Abierto de Lunes a Domingo; horario varea--personal asignado al centro consiste de Navegadores para Pacientes (Patient Navigators) y voluntarios.  Localizado en Centro de Cncer: [facility]), numero de cuarto [phone],  en el primer piso, al lado izquierdo del elevador junto la sala de espera   Nmero de telfono: [phone]     .............................................................................................................................................    Patient Experience Department    [facility]'s Patient Experience Department is here to assist you and your family to ensure that your experience with us  is a positive one. Please tell us  about your experience by contacting us  at [phone], or at [email or web address].  Your feedback will be kept confidential.    *If you get a bill from a molecular testing company [facility]  Medicine, [place], [place], [employer], [facility], [employer]) please let us  know before you pay. Our staff can help resolve this.

## 2024-01-06 NOTE — Progress Notes (Signed)
 GI Oncology Progress    Name: Jenna Bridges  DOB: [birth year]  Age: 68 year old  Sex: female  MRN: [id number]  Date of Service: 01/13/2024      Chief Complaint: PNET    HPI: [name] is a 68 year old female presenting with metastatic PNET (Ki67 of 3%) on Octreotide  30mg  monthly until 04/06/2019 when interval scans showed progression per last scan at [facility] transferring all care to [place] with progression on PRRT > response > lanreotide    History of PNET following with Dr. Kay at [facility] on octreotide  since 08/2017. Previous to this noted watery diarrhea and had increase from 20mg  to 30mg  with good resolution. Has had no abdominal pain or carcinoid symptoms. Recent CT CAP and MRI of pancreas October 2020 found new hepatic lesions and slight interval growth compared with 1 year prior.    She is here to discuss additional treatment options for her and overall prognosis.     05/22/2020: presents alone. States that the [facility] oncology practice is 'closing' and that she wants to transfer care to [place]. Has report from October that says there is progression on her MRI scans but they continued on octreotide  30mg  as she was clinically well. Otherwise notes diarrhea if her injection is delayed. Is set to get last injection at [facility] on 05/28/2020 then will need 06/2020 injection from that point forward.     07/18/2020: presents alone. Recent ED visit (1/24) with abdominal pain on right abdomen subcostal, severe when lying down. Restaged at that time showing minor right pleural thickening above right hepatic disease. Scattered 4mm nodules.No pain has returned, no carcinoid issues.    10/17/2020:  Presents alone. On monthly Sandostatin  LAR, tolerating well. Denies flushing, abdominal pain, or diarrhea.    11/07/2020: presents alone today. Restaging scans completed with SD. No diarrhea, flushing, pain.    03/26/2021: presents alone today. Stress about scans but stable since 10/2020. OK with three times a year scans. No issues with  carcinoid syndrome. Has RUQ pain stable, believes from stress. Discussion on injection and depot with previous scar tissue.    05/29/2021: presents today alone.  Has no particular symptoms of carcinoid syndrome.  Her last scans done September 2022 show stable disease without any progression.    02/19/2022:   Denies flushing, diarrhea for the past several years (though these were presenting sx in Fall 2018). Reports concerns with injection of sandostatin  - problems with needle breaking and was concerned that she wasn't getting the whole dose because she didn't have an injection site reaction. Also had an episode of significant bruising after last infusion with some continued discomfort to palpation. Last injection 8/15.  Denies fevers, chills, bony pain, abdominal distension, changes in BM, abdominal pain, n/v.     03/19/2022: presents today alone. She had PET Cu64 complete with disease in liver and pancreatic tail with T8 lesion. She has no symptoms and no back pain. A lot of family issues with husband celiac, daughter crohns, daughter in law with cervical dystonia. Would like to know options for treatment. Without diarrhea, flushing, pain.     05/27/2022: tolerated treatment well without issues. No vomiting but had nausea. Went through about 3 bottles of water. Had a syncopal episode at 10pm after getting off the couch and going to the restroom. Husband found her on the floor, no injuries but rib pain on the left. Noted she was next to neuropsych unit and could hear yelling next door. Otherwise tolerated therapy well and  husband took her home without issues.     07/29/2022: presents alone today. Feeling very well with PRRT and no issues with next round. Was prepared with hydration as well as food. Potassium was normal on recheck of labs and LFTs are stable, blood counts stable. Pending follow up PET Cu64     09/23/2022:  Patient presents today in-person.  She is tolerating the PRRT exceptionally well with no particular  fatigue right upper quadrant pain or hyperkalemia.  She is questions regarding restaging scans in steps.    11/26/2022: presents alone today in person. Feeling very well and has had great response on PET Cu64 and CEA downtrending. Daughter setting up a lab at [employer] as Geophysicist/field seismologist professor. Eating and drinking very well no abdominal pain.     03/11/2023: Presents alone. CT scans on 9/6 with continued response in liver and pancreas mass, thoracic osseous mets stable. CEA trending down. Continues on monthly Lanreotide. No pain or symptoms. Feels well overall.    08/26/2023: presents in person today. Restaging not completed yet today and stable on my preliminary read. Overall health well, no abdominal pain, no carcinoid syndrome. Daughter opening lab at [facility]. CEA downtrending.    01/13/2024: presents alone. N/t previously reported resolved- relates to sleeping wrong. Denies abdominal pain or symptoms of carcinoid syndrome. Reports recurrent zit' on the tip of her nose. Non pruritic. Develops pustule which she expresses. Last occurred a year ago, saw Derm who 'didn't see anything'.     Review of systems: Please refer to HPI for documentation of pertinent positives and negatives, otherwise all systems reviewed and negative      Oncologic Timeline:  09/09/2017-04/06/2019: Octreotide  Qmonthly  04/27/2019: [facility] Consult  03/2020: Per report, progression on MRI, continues on octreotide  30mg  monthly  05/22/2020: F/U Dr. Rexford  07/15/2020: ED visit with pain, CTA negative  07/18/2020: RV Dr. Rexford  2/20/222: MRI AP- hepatic lesions similar in size  10/17/2020: RV Dr Rexford  11/07/2020: RV Dr. Rexford  03/26/2021: RV  05/26/2021: RV  07/27/2020: RV with new staging  02/19/2022: RV with new staging  03/19/2022: plan to move to lutathera   05/05/2022: Lutathera  D1  05/28/2022: Octreotide   06/30/2022: Lutathera  D2  10/20/2022: Lutathera  C4  11/20/2022: PRRT with response, CEA down  11/26/2022: Lanreotide 120 mg    12/24/2022:  Lanreotide  01/21/2023:  Lanreotide  02/18/2023:  Lanreotide  02/26/2023: CT CAP: overall continued response in liver and pancreas mass. Stable thorax osseous mets  03/11/2023: RV NP  03/17/2023: Lanreotide  08/26/2023: RV on CT stable, lanreotide  01/13/2024: RV NP, lanreotide 7/31  03/01/2024: Cu64 Dotatate PET      -----------------------------  Past Medical History:   Past Medical History:   Diagnosis Date    Liver metastases (CMS-HCC) 05/05/2019       Medications:   Current Outpatient Medications:     octreotide  (SANDOSTATIN  LAR) 30 MG injection, 30 mg by IntraMUSCULAR route every 28 days., Disp: , Rfl:     Allergies:   No Known Allergies      Social history:   Alcohol: None  Tobacco: None  Drugs: None  Living Situation: Lives with husband, daughter is a PhD Chiropodist at [employer]    Family history:  Father: Parkinson/Lewy Body Dementia  Cousins: breast, brain, colon cancer and lymphoma    -----------------------------  Physical exam:  Vitals:BP 152/77 (BP Location: Left arm, BP Patient Position: Sitting, BP cuff size: Regular)   Pulse 87   Temp 97 F (36.1  C) (Temporal)   Resp 15   Ht 5' 5.16 (1.655 m)   Wt 67.9 kg (149 lb 11.1 oz)   SpO2 98%   BMI 24.79 kg/m   Wt Readings from Last 3 Encounters:   01/13/24 67.9 kg (149 lb 11.1 oz)   12/23/23 67.8 kg (149 lb 7.6 oz)   10/28/23 68.3 kg (150 lb 9.2 oz)     PHYSICAL EXAM     ECOG:0  GENERAL APPEARANCE: The patient is an alert, pleasant female in no acute distress.  SKIN: mildly erythematous macular lesion 0.5 mm on tip of nose. No pustule. No jaundice  EYES: PERRLA, EOMs are intact. No icterus.   EAR, NOSE, AND THROAT: The oropharynx is clear and moist.   LYMPH NODES: No appreciable observable lymphadenopathy  CHEST: The lungs are clear to auscultation. Normal respiratory effort  HEART: RRR S1 and S2 are normal.  There are no murmurs or extra sounds.  ABDOMEN:  The abdomen is soft and nontender. No distension or fluid wave.+BS. There is no  hepatosplenomegaly.   MSK: no spinal TTP. Joints are normal without redness or swelling.  EXTREMITES: There is no edema or cyanosis.   NEURO:  Cranial nerves II through XII intact, strength 5 out of 5 throughout. No focal deficits             -----------------------------  Labs: Reviewed by myself  Lab Results   Component Value Date    WBC 5.5 09/02/2023    RBC 4.23 09/02/2023    HGB 13.5 09/02/2023    HCT 39.4 09/02/2023    MCV 93.1 09/02/2023    MCHC 34.3 09/02/2023    RDW 12.9 09/02/2023    PLT 207 09/02/2023    MPV 9.2 (L) 09/02/2023     Lab Results   Component Value Date    BUN 12 09/02/2023    CREAT 0.49 (L) 09/02/2023    CL 101 09/02/2023    NA 140 09/02/2023    K 3.9 09/02/2023    CA 10.0 09/02/2023    TBILI 0.43 09/02/2023    ALB 4.7 09/02/2023    TP 8.0 09/02/2023    AST 25 09/02/2023    ALK 178 (H) 09/02/2023    BICARB 28 09/02/2023    ALT 32 09/02/2023    GLU 157 (H) 09/02/2023     Pancreatic Polypeptide 07/2017: 837  Chromogranin A 07/2017: 1  CA19-9 07/2017: 8  Alk Phos 11/2018: 176    Colonoscopy    Radiology: Reviewed by myself in conjunction with radiology report  No results found.     03/2020 [facility] MRI Report      04/04/2019 MRI Pancreas    CT Chest 03/27/2019  - No evidence of thoracic metastatic disease    CT A/P 03/24/2019    PET Gallium 68  - Primary pancreatic mass with intense radiotracer uptake and multiple hepatic metastatic lesions    Pathology:    Ki67 3%    Molecular:    PDL1 0%      -----------------------------  Assessment & Plan:  Jenna Bridges is a 68 year old female presenting with metastatic PNET with progression on 30mg  Octreotide  Monthly + Lutathera  > Lanreotide    We discussed with the patient and their family the natural course of NET. We discussed how many of these come from GI sources including the pancreas, colon, and stomach. Occasionally they are seen from the lung as well. We discussed that they can be indolent and slow growing  or quickly capable of rapid growth and spread.  Treatment options for symptom relief include somatostatin analogues. Although fewer than 10% of patients see tumor shrinkage with these drugs, prolonged stable disease is seen in 66% which delays progression.     We discussed that if there was interval growth on the next imaging further options include everolimus, CAPTEM, TKIs (Sunitinib, sorafenib, pazopanib, and cabozantinib) and Lutathera . We will also evaluate the ability to send tissue for NGS today with [facility]    Ultimately we would continue octreotide  at this time and add everolimus. We discussed evaluating in 2 months to see if growth stopped or continued. After this we would then recommend either CAPTEM or Lutathera . We discussed the side effects dermatologically of everolimus and instructed the patient to pre-empt the rash with a good face hygiene regimen.     Would have an RV in January to discuss restaging scans.    05/22/2020: We will port her scans in from [facility] and restage now given 3 month interval. She previously had a PET GA68 to follow as well for Lutathera . We discussed increasing octreotide  or adding regorafenib/everolimus in addition next. We will schedule her for her 06/2020 injection as well to reduce her diarrheal burden. RV monthly until stability. NGS completed as above and added to chart. 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 We discussed MEN can be used as a screening tool for future cancers and she will talk to them but doesn't want to make decision now.    07/18/2020: pain may be referred from hepatic disease, no obvious right effusion needing thoracentesis. Pain has resolved and no diarrheal symptoms. Would recommend quick interval scan in 2 months time, mid March. Continue injections of octreotide .    10/17/2020: No symptoms of carcinoid. Tolerating Sandostatin  LAR without issues. Labs  today (CBC CMP TSH Vit B12). Can use these labs for CT AP scheduled for 5/11. Continue Sandostatin  injections and follow up after imaging.     11/07/2020: doing very well, scans show stability. No nutritional deficiencies seen on labs. Continue with therapy regularly each month. Restage in 3-6 months.    03/26/2021: continues to do very well. RUQ pain likely related to either liver capsule stretching or possible anxiety. Stable disease on scans without evidence of carcinoid syndrome. Again discussion on MEN1 and screening for pituitary and thyroid  disorders and declines. Also discussed genetics counseling and family but declines as well. Will see her in 2 months with scans in 4 months.     05/29/2021:  Continuing to do very well on current dosage and schedule.  We will restage in 6 months' time.    02/19/2022: CT of chest, abdomen, and pelvis significant for interval increase in liver lesions as well as increased lymphadenopathy and increased sclerotic lesion on T8 vertebral body, but similar size of pancreatic tail mass. Discussed next steps including possible treatments such as everolimus, IR ablation, chemotherapy, and lutathera . Plan to continue octreotide  and obtain a PET-copper  scan to better characterize size and location of lesions. Will determine additional treatment based on results of PET.     03/19/2022: at this time with PET Cu64 showing diffuse and progressive disease discussed multiple options for improved control. Discussed biopsy of newly growing area but patient declines at this time. Discussed moving to lanreotide but concern for definitive control, more likely slow growth over  time. Does not wish to take a daily pill or oral chemotherapy. As such Lutathera  may be a more appropriate route. It does not disrupt her day to day and allows her to continue with a more effective, targeted approach that appeals to her. Will have the Nuclear Medicine team reach out to her to schedule and work with them to align  our octreotide  schedule with her treatment.     05/27/2022: will continue with Lutathera  every 8 weeks. Will have octreotide  tomorrow here at [facility]. Has weight gain and energy. Left rib pain likely from fall and rib pain and MSK bruising but explained it will take months to heal. Discussed at length her chronic treatment regimen is tiresome and its ok to feel stressed or tired or take care of herself over others.     07/29/2022: next PRRT on 08/25/2022 and again 10/20/2022. We will order her PET Cu64 thereafter. Continuing monitoring renal function and hyperkalemia fro mPRRT as well as LFTs. Carcinoid well controlled on interval octreotide . EKGs appropriate s/p PRRT.     09/23/2022:  At this time we will sign off on her last PRRT and order a PET copper  to be completed 4 weeks after to permit octreotide  washout.  She should not get octreotide  prior to this injection as it will block the receptors needed for the imaging.  We will then have her return to discuss the next steps and necessity of additional PRRT, continue with octreotide  alone, adding additional oral tablets, or any liver directed therapies.    11/26/2022: will move from PRRT now to Lanreotide monthly. Now will continue with restaging in 3 months to see if improved on scans or not. Improved pain in abdomen. Has weight gain but no intentional increase but acceptable for her.     08/26/2023: Continues on Lanreotide and tolerating well. No pain or symptoms of concern. Reviewed CT CAP with patient- continued response in liver, pancreas mass decreased, and osseous mets stable. CT CAP in 6 months per patient preference.     01/13/2024: Continues on Lanreotide. Tolerating well. No symptoms of carcinoid syndrome. Next injection on 01/20/24 but she needs to reschedule the time. Provided FT scheduling number. Added labs to all injections as last we have are from March. Can proceed with injection without results. Dotatate PET scheduled for 9/10 and RV Dr Rexford on 03/08/24 to review.          ICD-10-CM ICD-9-CM   1. 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 We discuss alternative treatments, expected side effects, and rare occurrences. Further the patient and I discuss the expected outcomes as well as contact number for anyone on our team.      I personally spent 40 minutes in face-to-face and non face-to-face activities related to the patient's visit today, excluding separately reportable services/procedures. The plan was carefully reviewed verbally with the patient, multiple questions answered to satisfaction, and also affirmed understanding of next steps and follow up plan    Lauraine Level, MSN, ANP-BC, AOCNP            -----------------------------  Lab   Orders Placed This Encounter   Procedures    CBC w/ Diff [place]    Comprehensive Metabolic Panel    CEA Tumor - See Instructions    CBC w/ Diff [place]ender    Comprehensive Metabolic Panel  CEA Tumor - See Instructions    CBC w/ Diff Lavender    Comprehensive Metabolic Panel    CEA Tumor - See Instructions    CBC w/ Diff Lavender    Comprehensive Metabolic Panel    CEA Tumor - See Instructions     Imaging   No orders of the defined types were placed in this encounter.    Procedures   No orders of the defined types were placed in this encounter.    Other No orders of the defined types were placed in this encounter.

## 2024-01-13 ENCOUNTER — Ambulatory Visit: Attending: Nurse Practitioner | Admitting: Nurse Practitioner

## 2024-01-13 VITALS — BP 152/77 | HR 87 | Temp 97.0°F | Resp 15 | Ht 65.16 in | Wt 149.7 lb

## 2024-01-13 DIAGNOSIS — C7B02 Secondary carcinoid tumors of liver: Secondary | ICD-10-CM | POA: Insufficient documentation

## 2024-01-13 DIAGNOSIS — D3A8 Other benign neuroendocrine tumors: Secondary | ICD-10-CM

## 2024-01-18 ENCOUNTER — Encounter (HOSPITAL_BASED_OUTPATIENT_CLINIC_OR_DEPARTMENT_OTHER): Payer: Self-pay | Admitting: Hematology & Oncology

## 2024-01-20 ENCOUNTER — Ambulatory Visit
Admission: RE | Admit: 2024-01-20 | Discharge: 2024-01-20 | Disposition: A | Discharge status: Home / Self Care | Attending: Hematology & Oncology | Admitting: Hematology & Oncology

## 2024-01-20 ENCOUNTER — Ambulatory Visit (HOSPITAL_BASED_OUTPATIENT_CLINIC_OR_DEPARTMENT_OTHER): Admit: 2024-01-20

## 2024-01-20 VITALS — BP 148/77 | HR 90 | Temp 97.6°F | Resp 15

## 2024-01-20 DIAGNOSIS — E3400 Carcinoid syndrome, unspecified: Secondary | ICD-10-CM | POA: Insufficient documentation

## 2024-01-20 DIAGNOSIS — C787 Secondary malignant neoplasm of liver and intrahepatic bile duct: Secondary | ICD-10-CM | POA: Insufficient documentation

## 2024-01-20 DIAGNOSIS — C254 Malignant neoplasm of endocrine pancreas: Secondary | ICD-10-CM | POA: Insufficient documentation

## 2024-01-20 LAB — COMPREHENSIVE METABOLIC PANEL, BLOOD
ALT (SGPT): 61 U/L — ABNORMAL HIGH (ref 0–35)
AST (SGOT): 38 U/L — ABNORMAL HIGH (ref 0–35)
Albumin: 4.4 g/dL (ref 3.5–5.2)
Alkaline Phos: 184 U/L — ABNORMAL HIGH (ref 40–130)
Anion Gap: 13 mmol/L (ref 7–15)
BUN: 15 mg/dL (ref 8–23)
Bicarbonate: 26 mmol/L (ref 22–29)
Bilirubin, Tot: 0.47 mg/dL (ref <1.2)
Calcium: 9.6 mg/dL (ref 8.5–10.6)
Chloride: 101 mmol/L (ref 98–107)
Creatinine: 0.56 mg/dL (ref 0.51–0.95)
Glucose: 111 mg/dL — ABNORMAL HIGH (ref 70–99)
Potassium: 3.8 mmol/L (ref 3.5–5.1)
Sodium: 140 mmol/L (ref 136–145)
Total Protein: 7.7 g/dL (ref 6.0–8.0)
eGFR Based on CKD-EPI 2021 Equation: >60 mL/min/1.73 m2

## 2024-01-20 LAB — CBC WITH DIFF, BLOOD
ANC-Automated: 3.5 1000/mm3 (ref 1.6–7.0)
ANC-Instrument: 3.5 1000/mm3 (ref 1.6–7.0)
Abs Basophils: 0 1000/mm3 (ref <0.2)
Abs Eosinophils: 0.1 1000/mm3 (ref 0.0–0.5)
Abs Lymphs: 1.3 1000/mm3 (ref 0.8–3.1)
Abs Monos: 0.5 1000/mm3 (ref 0.2–0.8)
Basophils: 0.2 %
Eosinophils: 1.3 %
Hct: 35.2 % (ref 34.0–45.0)
Hgb: 12.3 g/dL (ref 11.2–15.7)
Imm Gran %: 0.4 % (ref <1)
Imm Gran Abs: 0 1000/mm3 (ref <0.1)
Lymphocytes: 23.8 %
MCH: 31.8 pg (ref 26.0–32.0)
MCHC: 34.9 g/dL (ref 32.0–36.0)
MCV: 91 um3 (ref 79.0–95.0)
MPV: 9.5 fL (ref 9.4–12.4)
Monocytes: 9.2 %
Plt Count: 176 1000/mm3 (ref 140–370)
RBC: 3.87 mill/mm3 — ABNORMAL LOW (ref 3.90–5.20)
RDW: 13 % (ref 12.0–14.0)
Segs: 65.1 %
WBC: 5.4 1000/mm3 (ref 4.0–10.0)

## 2024-01-20 LAB — CEA TUMOR, BLOOD: CEA Tumor: 6.4 ng/mL

## 2024-01-20 MED ORDER — LANREOTIDE ACETATE 120 MG/0.5ML SC SOLN
120.0000 mg | Freq: Once | SUBCUTANEOUS | Status: AC
Start: 2024-01-20 — End: 2024-01-20
  Administered 2024-01-20: 120 mg via SUBCUTANEOUS
  Filled 2024-01-20: qty 1

## 2024-01-20 NOTE — Interdisciplinary (Signed)
 Patient arrives to fast track in stable condition. Patient denies any recent fevers, cough, or runny nose. VSS. Patient also to have labs collected with injection appointment.    Peripheral stick to: LAC On 1st attempt using 23g kit and aseptic technique.  Labs drawn: 1110  Needle removed intact. Site benign. Gauze and pressure dressing placed.    Lanreotide injection given to left buttocks. Tolerated well. Dressing applied. C/D/I.  Patient discharged to home in no apparent distress. Return to clinic in 1 month(s) for Lanreotide injection.    Medications   lanreotide acetate  (SOMATULINE DEPOT ) depot injection 120 mg (120 mg Subcutaneous Given 01/20/24 1112)

## 2024-01-21 NOTE — Addendum Note (Signed)
 Encounter addended by: Gjergji, Elona on: 01/21/2024 6:04 AM   Actions taken: Charge Capture section accepted

## 2024-02-17 ENCOUNTER — Ambulatory Visit
Admission: RE | Admit: 2024-02-17 | Discharge: 2024-02-17 | Disposition: A | Discharge status: Home / Self Care | Attending: Hematology & Oncology | Admitting: Hematology & Oncology

## 2024-02-17 VITALS — Temp 97.2°F | Resp 17

## 2024-02-17 DIAGNOSIS — E3400 Carcinoid syndrome, unspecified: Secondary | ICD-10-CM | POA: Insufficient documentation

## 2024-02-17 DIAGNOSIS — C787 Secondary malignant neoplasm of liver and intrahepatic bile duct: Secondary | ICD-10-CM | POA: Insufficient documentation

## 2024-02-17 DIAGNOSIS — C254 Malignant neoplasm of endocrine pancreas: Secondary | ICD-10-CM | POA: Insufficient documentation

## 2024-02-17 LAB — CBC WITH DIFF, BLOOD
ANC-Automated: 3.4 1000/mm3 (ref 1.6–7.0)
ANC-Instrument: 3.4 1000/mm3 (ref 1.6–7.0)
Abs Basophils: 0 1000/mm3 (ref <0.2)
Abs Eosinophils: 0.1 1000/mm3 (ref 0.0–0.5)
Abs Lymphs: 1.3 1000/mm3 (ref 0.8–3.1)
Abs Monos: 0.5 1000/mm3 (ref 0.2–0.8)
Basophils: 0.4 %
Eosinophils: 1.8 %
Hct: 35.1 % (ref 34.0–45.0)
Hgb: 12.1 g/dL (ref 11.2–15.7)
Imm Gran %: 0.4 % (ref <1)
Imm Gran Abs: 0 1000/mm3 (ref <0.1)
Lymphocytes: 24.4 %
MCH: 31.5 pg (ref 26.0–32.0)
MCHC: 34.5 g/dL (ref 32.0–36.0)
MCV: 91.4 um3 (ref 79.0–95.0)
MPV: 9.7 fL (ref 9.4–12.4)
Monocytes: 9.9 %
Plt Count: 187 1000/mm3 (ref 140–370)
RBC: 3.84 mill/mm3 — ABNORMAL LOW (ref 3.90–5.20)
RDW: 12.8 % (ref 12.0–14.0)
Segs: 63.1 %
WBC: 5.5 1000/mm3 (ref 4.0–10.0)

## 2024-02-17 LAB — COMPREHENSIVE METABOLIC PANEL, BLOOD
ALT (SGPT): 45 U/L — ABNORMAL HIGH (ref 0–35)
AST (SGOT): 30 U/L (ref 0–35)
Albumin: 4.3 g/dL (ref 3.5–5.2)
Alkaline Phos: 184 U/L — ABNORMAL HIGH (ref 40–130)
Anion Gap: 13 mmol/L (ref 7–15)
BUN: 13 mg/dL (ref 8–23)
Bicarbonate: 25 mmol/L (ref 22–29)
Bilirubin, Tot: 0.46 mg/dL (ref <1.2)
Calcium: 8.8 mg/dL (ref 8.5–10.6)
Chloride: 102 mmol/L (ref 98–107)
Creatinine: 0.51 mg/dL (ref 0.51–0.95)
Glucose: 133 mg/dL — ABNORMAL HIGH (ref 70–99)
Potassium: 3.8 mmol/L (ref 3.5–5.1)
Sodium: 140 mmol/L (ref 136–145)
Total Protein: 7.3 g/dL (ref 6.0–8.0)
eGFR Based on CKD-EPI 2021 Equation: >60 mL/min/1.73 m2

## 2024-02-17 LAB — CEA TUMOR, BLOOD: CEA Tumor: 7.5 ng/mL

## 2024-02-17 MED ORDER — LANREOTIDE ACETATE 120 MG/0.5ML SC SOLN
120.0000 mg | Freq: Once | SUBCUTANEOUS | Status: AC
Start: 2024-02-17 — End: 2024-02-17
  Administered 2024-02-17: 120 mg via SUBCUTANEOUS
  Filled 2024-02-17: qty 1

## 2024-02-17 NOTE — Interdisciplinary (Signed)
 Pre-administration assessment of patient completed by Burnard, RN.     02/17/24  1434   Temp: 97.2 F (36.2 C)   Resp: 17        Medications   lanreotide acetate  (SOMATULINE DEPOT ) depot injection 120 mg (120 mg Subcutaneous Given 02/17/24 1443)     LABS DRAWN PERIPHERALLY 1439 to LAC.    Lanreotide given SQ to right upper quad. gluteus using aseptic technique.      Jacquiline Spagna tolerated injection without adverse effects. Site benign. Band-aid placed.     Patient discharged to home in no apparent distress.   Discharge mode: Ambulatory   Return to clinic in 1 month(s) for LANREOTIDE.  Next appointment date given and reviewed with treatment plan. and request submitted.    SUBMITTED request to change appt to 09/26 Friday.    Kemia Wendel Cuano Mc[place], [place]

## 2024-02-17 NOTE — Interdisciplinary (Signed)
 ASSESSED PT FOR LANREOTIDE INJECTION TODAY. PT STATED SHE HAS HAD THIS INJECTION MANY TIMES BEFORE, HAS NO QUESTIONS ABOUT IT AND NO SIDE EFFECTS. INJECTION GIVEN BY HAZEL, LVN.   Medications   lanreotide acetate  (SOMATULINE DEPOT ) depot injection 120 mg (120 mg Subcutaneous Given 02/17/24 1443)

## 2024-02-17 NOTE — Addendum Note (Signed)
 Encounter addended by: Rolm Burnard Leas, RN on: 02/17/2024 4:15 PM   Actions taken: Clinical Note Signed

## 2024-02-23 NOTE — Addendum Note (Signed)
 Encounter addended by: Matilde Norris on: 02/23/2024 10:41 AM   Actions taken: Charge Capture section accepted

## 2024-03-01 ENCOUNTER — Ambulatory Visit
Admission: RE | Admit: 2024-03-01 | Discharge: 2024-03-01 | Disposition: A | Discharge status: Home Health | Attending: Hematology & Oncology | Admitting: Hematology & Oncology

## 2024-03-01 ENCOUNTER — Ambulatory Visit (HOSPITAL_BASED_OUTPATIENT_CLINIC_OR_DEPARTMENT_OTHER)
Admit: 2024-03-01 | Discharge: 2024-03-01 | Disposition: A | Discharge status: Home Health | Attending: Hematology & Oncology | Admitting: Hematology & Oncology

## 2024-03-01 DIAGNOSIS — C7B02 Secondary carcinoid tumors of liver: Secondary | ICD-10-CM | POA: Insufficient documentation

## 2024-03-01 DIAGNOSIS — D3A8 Other benign neuroendocrine tumors: Secondary | ICD-10-CM | POA: Insufficient documentation

## 2024-03-01 MED ORDER — COPPER CU 64 DOTATATE 1 MCI/ML IV SOLN
4.4600 | Freq: Once | INTRAVENOUS | Status: AC
Start: 2024-03-01 — End: 2024-03-01
  Administered 2024-03-01: 4.46 via INTRAVENOUS
  Filled 2024-03-01: qty 8

## 2024-03-04 DIAGNOSIS — C7B02 Secondary carcinoid tumors of liver: Secondary | ICD-10-CM

## 2024-03-04 DIAGNOSIS — D3A8 Other benign neuroendocrine tumors: Secondary | ICD-10-CM

## 2024-03-08 ENCOUNTER — Ambulatory Visit: Attending: Hematology & Oncology | Admitting: Hematology & Oncology

## 2024-03-08 VITALS — BP 155/89 | HR 98 | Temp 97.9°F | Resp 16 | Ht 65.16 in | Wt 148.6 lb

## 2024-03-08 DIAGNOSIS — Z09 Encounter for follow-up examination after completed treatment for conditions other than malignant neoplasm: Secondary | ICD-10-CM | POA: Insufficient documentation

## 2024-03-08 DIAGNOSIS — C254 Malignant neoplasm of endocrine pancreas: Secondary | ICD-10-CM | POA: Insufficient documentation

## 2024-03-08 DIAGNOSIS — D3A8 Other benign neuroendocrine tumors: Secondary | ICD-10-CM | POA: Insufficient documentation

## 2024-03-08 DIAGNOSIS — C7B02 Secondary carcinoid tumors of liver: Secondary | ICD-10-CM | POA: Insufficient documentation

## 2024-03-08 DIAGNOSIS — K8681 Exocrine pancreatic insufficiency: Secondary | ICD-10-CM | POA: Insufficient documentation

## 2024-03-08 NOTE — Progress Notes (Signed)
 GI Oncology Progress    Name: Jenna Bridges  DOB: [birth year]  Age: 68 year old  Sex: female  MRN: [id number]  Date of Service: 03/08/2024      Chief Complaint: PNET    HPI: [name] is a 68 year old female presenting with metastatic PNET (Ki67 of 3%) on Octreotide  30mg  monthly until 04/06/2019 when interval scans showed progression per last scan at [facility] transferring all care to [place] with progression on PRRT > response > lanreotide > minor PD > PRRT    03/08/2024  History of Present Illness  The patient is a 68 year old female who presents for follow-up of metastatic pancreatic neuroendocrine tumor. She has been on octreotide  since 2019, with progression status post Lutathera  in 2023, which showed a positive response. Continued treatment with lanreotide has been ongoing, with a recent copper  dotatate scan on 03/01/2024 revealing new uptake in the liver and skeleton, raising concerns for new metastatic disease. The scan indicated focal uptake in the left hepatic lobe and two sclerotic osseous lesions of the right ribs, along with decreased avidity of a T8 sclerotic osseous lesion. Previous TempusxT showed a MEN1 at 43.7%, tumor mutation burden of 2.1, PD-L1 less than 1%.    She reports feeling well overall, with no loss of appetite or pain and maintains good energy levels. Her last lanreotide injection was administered on 02/17/2024. She recalls an incident where she felt a crack while brushing her teeth, leading her to believe she may have fractured a rib.    FAMILY HISTORY  Her daughter has had Crohn's disease for 10 years and has primary biliary sclerosis. Her father-in-law died at age 49 of colon cancer.          Review of systems: Please refer to HPI for documentation of pertinent positives and negatives, otherwise all systems reviewed and negative      Oncologic Timeline:  09/09/2017-04/06/2019: Octreotide  Qmonthly  04/27/2019: [facility] Consult  03/2020: Per report, progression on MRI, continues on  octreotide  30mg  monthly  05/22/2020: F/U Dr. Rexford  07/15/2020: ED visit with pain, CTA negative  07/18/2020: RV Dr. Rexford  2/20/222: MRI AP- hepatic lesions similar in size  10/17/2020: RV Dr Rexford  11/07/2020: RV Dr. Rexford  03/26/2021: RV  05/26/2021: RV  07/27/2020: RV with new staging  02/19/2022: RV with new staging  03/19/2022: plan to move to lutathera   05/05/2022: Lutathera  D1  05/28/2022: Octreotide   06/30/2022: Lutathera  D2  10/20/2022: Lutathera  C4  11/20/2022: PRRT with response, CEA down  11/26/2022: Lanreotide 120 mg    12/24/2022: Lanreotide  01/21/2023:  Lanreotide  02/18/2023:  Lanreotide  02/26/2023: CT CAP: overall continued response in liver and pancreas mass. Stable thorax osseous mets  03/11/2023: RV NP  03/17/2023: Lanreotide  08/26/2023: RV on CT stable, lanreotide  01/13/2024: RV NP, lanreotide 7/31  03/01/2024: Cu64 Dotatate PET w/ minor PD  03/08/2024: Plan for PRRT      -----------------------------  Past Medical History:   Past Medical History:   Diagnosis Date    Liver metastases (CMS-HCC) 05/05/2019       Medications:   Current Outpatient Medications:     octreotide  (SANDOSTATIN  LAR) 30 MG injection, 30 mg by IntraMUSCULAR route every 28 days., Disp: , Rfl:     Allergies:   No Known Allergies      Social history:   Alcohol: None  Tobacco: None  Drugs: None  Living Situation: Lives with husband, daughter is a PhD Chiropodist at [employer]  Family history:  Father: Parkinson/Lewy Body Dementia  Cousins: breast, brain, colon cancer and lymphoma    -----------------------------  Physical exam:  Vitals:BP 155/89 (BP Location: Left arm, BP Patient Position: Sitting, BP cuff size: Regular)   Pulse 98   Temp 97.9 F (36.6 C) (Temporal)   Resp 16   Ht 5' 5.16 (1.655 m)   Wt 67.4 kg (148 lb 9.4 oz)   SpO2 98%   BMI 24.61 kg/m   Wt Readings from Last 3 Encounters:   03/08/24 67.4 kg (148 lb 9.4 oz)   01/13/24 67.9 kg (149 lb 11.1 oz)   12/23/23 67.8 kg (149 lb 7.6 oz)     PHYSICAL EXAM     Physical  Exam  Constitutional: No distress, good energy  ECOG:0  GENERAL APPEARANCE: The patient is an alert, pleasant female in no acute distress.  SKIN: mildly erythematous macular lesion 0.5 mm on tip of nose. No pustule. No jaundice  EYES: PERRLA, EOMs are intact. No icterus.   EAR, NOSE, AND THROAT: The oropharynx is clear and moist.   LYMPH NODES: No appreciable observable lymphadenopathy  CHEST: The lungs are clear to auscultation. Normal respiratory effort  HEART: RRR S1 and S2 are normal.  There are no murmurs or extra sounds.  ABDOMEN:  The abdomen is soft and nontender. No distension or fluid wave.+BS. There is no hepatosplenomegaly.   MSK: no spinal TTP. Joints are normal without redness or swelling.  EXTREMITES: There is no edema or cyanosis.   NEURO:  Cranial nerves II through XII intact, strength 5 out of 5 throughout. No focal deficits       -----------------------------  Results  Imaging   - Copper  dotatate scan: 03/01/2024, New uptake in the liver and skeleton concerning for new metastatic disease with focal uptake in the left hepatic lobe and 2 sclerotic osseous lesions of the right ribs with decreased avidity of a T8 sclerotic osseous lesion.  Labs: Reviewed by myself  Lab Results   Component Value Date    WBC 5.5 02/17/2024    RBC 3.84 (L) 02/17/2024    HGB 12.1 02/17/2024    HCT 35.1 02/17/2024    MCV 91.4 02/17/2024    MCHC 34.5 02/17/2024    RDW 12.8 02/17/2024    PLT 187 02/17/2024    MPV 9.7 02/17/2024     Lab Results   Component Value Date    BUN 13 02/17/2024    CREAT 0.51 02/17/2024    CL 102 02/17/2024    NA 140 02/17/2024    K 3.8 02/17/2024    CA 8.8 02/17/2024    TBILI 0.46 02/17/2024    ALB 4.3 02/17/2024    TP 7.3 02/17/2024    AST 30 02/17/2024    ALK 184 (H) 02/17/2024    BICARB 25 02/17/2024    ALT 45 (H) 02/17/2024    GLU 133 (H) 02/17/2024     Pancreatic Polypeptide 07/2017: 837  Chromogranin A 07/2017: 1  CA19-9 07/2017: 8  Alk Phos 11/2018: 176    Colonoscopy    Radiology: Reviewed by  myself in conjunction with radiology report  PET/CT Cu64 Dotatate Skull To Mid Thigh (Non Diag CT For [facility])  Result Date: 03/04/2024  IMPRESSION: DOTATATE PET/CT demonstrates areas of new uptake in the liver and osseous skeleton, concerning for new metastatic disease with treatment related changes involving other areas of metastatic disease. Compared with prior DOTATATE PET/CT, there are two new foci of uptake in the left  hepatic lobe and two new sclerotic osseous lesions of the right ribs. Markedly decreased DOTATATE avidity of the T8 sclerotic osseous lesion. Areas of resolved or decreased size of DOTATATE avidity in the liver.        03/2020 [facility] Report      04/04/2019 MRI Pancreas    CT Chest 03/27/2019  - No evidence of thoracic metastatic disease    CT A/P 03/24/2019    PET Gallium 68  - Primary pancreatic mass with intense radiotracer uptake and multiple hepatic metastatic lesions    Pathology:    Ki67 3%    Molecular:    PDL1 0%      -----------------------------  Assessment & Plan:  Jenna Bridges is a 68 year old female presenting with metastatic PNET with progression on 30mg  Octreotide  Monthly + Lutathera  > Lanreotide > minor PD > PRRT    Assessment & Plan  1. Metastatic pancreatic neuroendocrine tumor.  The patient has a history of metastatic pancreatic neuroendocrine tumor with progression status post Lutathera  in 2023, showing response. Recent copper  dotatate scan on 03/01/2024 revealed new uptake in the liver and skeleton, indicating new metastatic disease with focal uptake in the left hepatic lobe and two sclerotic osseous lesions of the right ribs, along with decreased avidity of a T8 sclerotic osseous lesion.     A plan has been proposed to administer another cycle of Lutathera , likely 1 or 2 sessions. This treatment approach was effective 2 years ago. The next injection is scheduled for 03/17/2024, and the subsequent one will be around 04/17/2024. A follow-up scan will be conducted to assess the  effectiveness of the treatment. If the disease is controlled, lanreotide will be resumed. If new issues arise, radiation or PRRT may be considered.          No diagnosis found.          Therapeutic consent is obtained with all patients prior to the first infusion visit or dose. We discuss alternative treatments, expected side effects, and rare occurrences. Further the patient and I discuss the expected outcomes as well as contact number for anyone on our team.      I personally spent 40 minutes in face-to-face and non face-to-face activities related to the patient's visit today, excluding separately reportable services/procedures. The plan was carefully reviewed verbally with the patient, multiple questions answered to satisfaction, and also affirmed understanding of next steps and follow up plan    Cordella Pion MD/PhD          -----------------------------  Lab   No orders of the defined types were placed in this encounter.    Imaging   No orders of the defined types were placed in this encounter.    Procedures   No orders of the defined types were placed in this encounter.    Other No orders of the defined types were placed in this encounter.

## 2024-03-08 NOTE — Patient Instructions (Addendum)
 - Nuclear medicine to schedule consult: [facility] Scheduling at [phone]   - Please contact the infusion scheduling line at [phone] to schedule Lutathera  around 10/26  - Please return for follow up in one month with Lauraine Level NP    _____________________________________________________________________    Your Team:      PHYSICIAN: Cordella Pion, MD PhD      NURSE PRACTITIONER: Lauraine Level, NP. Lauraine is available Mon-Thursday and out of the office on Fridays.       NURSE CASE MANAGER: Hendrick Familia RN     Contacting your Cancer Care Team:  Hendrick Familia RN  For general healthcare questions, call your nurse/care team: [phone], 8 a.m. -4:30 p.m., Monday through Friday      If you are unable to reach your nurse AND you have urgent symptoms, call [facility] [phone], 8 a.m. - 9 p.m., Monday through Friday      After hours, for urgent symptoms contact [phone] and ask for the on-call oncologist.      For medical emergencies, please call 911        ADMINISTRATIVE ASSISTANT: Shasta Billet (Paperwork, obtaining imaging, scheduling)  Phone: [phone] option #4  Fax: [phone]    PATIENT NAVIGATOR: Hamza Ahmed   Phone: [phone]     *IF you are receiving infusions, please check with the infusion center for a code to enable you to receive free parking on infusion days. Please call [phone] Option 2, or stop at the front desk on your way out. These codes change monthly*                                                                                                                                                   The following link is a video that will help explain choosing a healthcare proxy and the process of advanced care planning.   [email or web address]/program-preview-[email or web address]-speaks-[email or web address]-[email or web address]    [facility] Phone List for Patients     Clinic Hours of Operation: Monday-Friday 8:00- 4:30 p.m. (closed holidays and weekends)   Infusion Center Hours:  Monday-Friday 7:00-9:30 p.m.; Sat-Sun 8:00-4:30pm.     Social Worker:   GI cancers: Rea Clare, LCSW, [phone]  Pancreas cancer: Silvano Pickup, LCSW, [phone]  For emotional, social, spiritual and practical needs that may arise throughout cancer treatment and recovery.    Information Desk/Call Center: [phone]   ** General information: directions, telephone numbers, or available services     [facility]: [phone], option 2  ** Call to schedule, cancel, or reschedule chemotherapy appointments   [facility] Scheduling: [phone]  [facility] Scheduling: [phone]    Radiation Oncology: [phone]   ** Call to schedule, cancel, or reschedule radiation appointments     Interventional Radiology: [phone]      Financial  Counselor: Wendee Irish [phone]   [email or web address]   [phone] is the centralized billing and insurance information number with a team of knowledgeable representatives ready to help M-F 9am to 6pm.     Registration: [phone] (to update personal information)    [facility]:   [phone]  Fax:[phone]  Open M-F 9-6pm    Radiology Scheduling: [phone]  For PET scans: [phone]    Medical Records: [id number]  Fax: [phone]    Chemotherapy Online Education:  [place] to recorded Chemotherapy & Immunotherapy Education Class: [email or web address]     Patient and Family Resource Center:  The Patient and Family Resource Center [facility]) offers the most up-to-date cancer information to patients, families, and the community. Various comfort items such as lap blankets, pillows, hats, and referrals for wigs are also available.  Open daily; hours vary--staffed by knowledgeable patient navigators and trained volunteers  Location: [facility], Room 1066, on the first floor just to the left of  the lobby elevators  Phone [phone]      [facility] de [place] para Pacientes y sus Familias:  El Centro de Recursos para Pacientes y Secondary school teacher (Patient and [employer])  Ofrece a pacientes, familiares, and la comunidad en general, informacin actualizada sobre cncer.   Tambin ah artculos disponibles como pequeas cobijas, almohadas, sombreros, y referencias para pelucas.   Abierto de Lunes a Domingo; horario varea--personal asignado al centro consiste de Navegadores para Pacientes (Patient Navigators) y voluntarios.  Localizado en Centro de Cncer: [facility]), numero de cuarto 1066,  en el primer piso, al lado izquierdo del elevador junto la sala de espera   Nmero de telfono: [phone]     .............................................................................................................................................    Patient Experience Department    [facility]'s Patient Experience Department is here to assist you and your family to ensure that your experience with us  is a positive one. Please tell us  about your experience by contacting us  at [phone], or at [email or web address].  Your feedback will be kept confidential.    *If you get a bill from a molecular testing company [facility], [place], [place], [employer], [facility], [employer]) please let us  know before you pay. Our staff can help resolve this.

## 2024-03-09 ENCOUNTER — Telehealth (HOSPITAL_BASED_OUTPATIENT_CLINIC_OR_DEPARTMENT_OTHER): Payer: Self-pay | Admitting: Hematology & Oncology

## 2024-03-09 ENCOUNTER — Other Ambulatory Visit (HOSPITAL_BASED_OUTPATIENT_CLINIC_OR_DEPARTMENT_OTHER): Payer: Self-pay | Admitting: Hematology & Oncology

## 2024-03-09 DIAGNOSIS — D3A8 Other benign neuroendocrine tumors: Secondary | ICD-10-CM

## 2024-03-09 NOTE — Telephone Encounter (Signed)
 Outpatient Oncology RN Narrative    -Spoke to patient, informed that f/u is to check in after starting Lutathera , patient verbalized understanding, patient will call to schedule Lutathera  and then call AA to schedule f/u with team

## 2024-03-09 NOTE — Telephone Encounter (Signed)
 [facility] Call Center - Telephone Message for MD/RN:    Incoming call received from: Patient     Provider: Cordella Deward Pion, MD, PhD    Reason for call:Pt Calling back per pt she will not schedule 1 month follow up with NP. Please advise.     Return call requested: Yes     Best callback number: [phone]    [facility] to leave a detailed message: Yes     Inform Caller:    The timeframe for return calls is typically within 24 hours or before the end of the business day.

## 2024-03-09 NOTE — Telephone Encounter (Signed)
 Left Patient a message to call and schedule     Sammye Rubin, RN  P Muc Front Desk / Npo  Good morning, can someone please reach out to patient to schedule the following:    Provider: Lauraine Level NP  Form of Appointment: in person  Date and Time: 1 month  Location: [facility] or [facility]  Length of Appointment: 30 minutes    Thank you!

## 2024-03-14 ENCOUNTER — Telehealth (HOSPITAL_BASED_OUTPATIENT_CLINIC_OR_DEPARTMENT_OTHER): Payer: Self-pay

## 2024-03-14 DIAGNOSIS — C254 Malignant neoplasm of endocrine pancreas: Secondary | ICD-10-CM

## 2024-03-14 DIAGNOSIS — E3400 Carcinoid syndrome, unspecified: Secondary | ICD-10-CM

## 2024-03-14 DIAGNOSIS — C787 Secondary malignant neoplasm of liver and intrahepatic bile duct: Secondary | ICD-10-CM

## 2024-03-14 NOTE — Telephone Encounter (Signed)
 Dr. Rexford,     Jenna Bridges is scheduled for injection of Lanreotide on 9/26.     The following items are currently outstanding:    Orders are unsigned    *As a reminder of our infusion center process, if an unsigned order and/or consent are not addressed 1 business day before the infusion center appointment, the patient appointment will be cancelled.      Thank you,   Rollo Bertin, RN  RN Liaison

## 2024-03-17 ENCOUNTER — Ambulatory Visit
Admission: RE | Admit: 2024-03-17 | Discharge: 2024-03-17 | Disposition: A | Attending: Nurse Practitioner | Admitting: Nurse Practitioner

## 2024-03-17 VITALS — BP 146/76 | HR 77 | Temp 98.1°F | Resp 15 | Ht 65.16 in | Wt 149.0 lb

## 2024-03-17 DIAGNOSIS — C254 Malignant neoplasm of endocrine pancreas: Secondary | ICD-10-CM | POA: Insufficient documentation

## 2024-03-17 DIAGNOSIS — C787 Secondary malignant neoplasm of liver and intrahepatic bile duct: Secondary | ICD-10-CM | POA: Insufficient documentation

## 2024-03-17 DIAGNOSIS — E3400 Carcinoid syndrome, unspecified: Secondary | ICD-10-CM | POA: Insufficient documentation

## 2024-03-17 LAB — COMPREHENSIVE METABOLIC PANEL, BLOOD
ALT (SGPT): 47 U/L — ABNORMAL HIGH (ref 0–35)
AST (SGOT): 32 U/L (ref 0–35)
Albumin: 4.2 g/dL (ref 3.5–5.2)
Alkaline Phos: 183 U/L — ABNORMAL HIGH (ref 40–130)
Anion Gap: 10 mmol/L (ref 7–15)
BUN: 14 mg/dL (ref 8–23)
Bicarbonate: 27 mmol/L (ref 22–29)
Bilirubin, Tot: 0.61 mg/dL (ref <1.2)
Calcium: 9.4 mg/dL (ref 8.5–10.6)
Chloride: 100 mmol/L (ref 98–107)
Creatinine: 0.52 mg/dL (ref 0.51–0.95)
Glucose: 199 mg/dL — ABNORMAL HIGH (ref 70–99)
Potassium: 4.2 mmol/L (ref 3.5–5.1)
Sodium: 137 mmol/L (ref 136–145)
Total Protein: 7.4 g/dL (ref 6.0–8.0)
eGFR Based on CKD-EPI 2021 Equation: >60 mL/min/1.73 m2

## 2024-03-17 LAB — CBC WITH DIFF, BLOOD
ANC-Automated: 2.6 1000/mm3 (ref 1.6–7.0)
ANC-Instrument: 2.6 1000/mm3 (ref 1.6–7.0)
Abs Basophils: 0 1000/mm3 (ref <0.2)
Abs Eosinophils: 0.1 1000/mm3 (ref 0.0–0.5)
Abs Lymphs: 1.2 1000/mm3 (ref 0.8–3.1)
Abs Monos: 0.5 1000/mm3 (ref 0.2–0.8)
Basophils: 0.2 %
Eosinophils: 1.2 %
Hct: 34.1 % (ref 34.0–45.0)
Hgb: 11.7 g/dL (ref 11.2–15.7)
Imm Gran %: 0.2 % (ref <1)
Imm Gran Abs: 0 1000/mm3 (ref <0.1)
Lymphocytes: 28.2 %
MCH: 31.2 pg (ref 26.0–32.0)
MCHC: 34.3 g/dL (ref 32.0–36.0)
MCV: 90.9 um3 (ref 79.0–95.0)
MPV: 9.2 fL — ABNORMAL LOW (ref 9.4–12.4)
Monocytes: 10.6 %
Plt Count: 172 1000/mm3 (ref 140–370)
RBC: 3.75 mill/mm3 — ABNORMAL LOW (ref 3.90–5.20)
RDW: 12.9 % (ref 12.0–14.0)
Segs: 59.6 %
WBC: 4.3 1000/mm3 (ref 4.0–10.0)

## 2024-03-17 LAB — CEA TUMOR, BLOOD: CEA Tumor: 7 ng/mL

## 2024-03-17 MED ORDER — LANREOTIDE ACETATE 120 MG/0.5ML SC SOLN
120.0000 mg | Freq: Once | SUBCUTANEOUS | Status: AC
Start: 2024-03-17 — End: 2024-03-17
  Administered 2024-03-17: 120 mg via SUBCUTANEOUS
  Filled 2024-03-17: qty 1

## 2024-03-17 NOTE — Interdisciplinary (Signed)
 Jenna Bridges presents for LANREOTIDE      Pre-treatment nursing assessment:   Patient arrived by ambulating  accompanied by Self   Vital signs are stable, and the patient is in no apparent distress.     03/17/24  0852   BP: 146/76   Pulse: 77   Temp: 98.1 F (36.7 C)   Resp: 15   SpO2: 97%        Patient denies nausea, vomiting, diarrhea, constipation, or fatigue.      Venipuncture done.  Labs drawn: 0910    LANREOTIDE given to LEFT BUTTOCK SUBQ. Tolerated well. Dressing applied. C/D/I.  Patient discharged to home in no apparent distress.     Medications   lanreotide acetate  (SOMATULINE DEPOT ) depot injection 120 mg (120 mg Subcutaneous Given 03/17/24 0906)     Labs Reviewed   CEA TUMOR, BLOOD   COMPREHENSIVE METABOLIC PANEL, BLOOD   CBC WITH DIFF, BLOOD

## 2024-03-18 ENCOUNTER — Ambulatory Visit (HOSPITAL_BASED_OUTPATIENT_CLINIC_OR_DEPARTMENT_OTHER)

## 2024-03-21 ENCOUNTER — Ambulatory Visit
Admission: RE | Admit: 2024-03-21 | Discharge: 2024-03-21 | Disposition: A | Discharge status: Home / Self Care | Attending: Hematology & Oncology | Admitting: Hematology & Oncology

## 2024-03-21 DIAGNOSIS — D3A8 Other benign neuroendocrine tumors: Secondary | ICD-10-CM | POA: Insufficient documentation

## 2024-03-21 NOTE — Addendum Note (Signed)
 Encounter addended by: Matilde Norris on: 03/21/2024 9:45 AM   Actions taken: Charge Capture section accepted

## 2024-03-29 ENCOUNTER — Encounter (HOSPITAL_BASED_OUTPATIENT_CLINIC_OR_DEPARTMENT_OTHER): Payer: Self-pay | Admitting: Hematology & Oncology

## 2024-03-30 ENCOUNTER — Ambulatory Visit

## 2024-04-10 ENCOUNTER — Telehealth (HOSPITAL_BASED_OUTPATIENT_CLINIC_OR_DEPARTMENT_OTHER): Payer: Self-pay

## 2024-04-10 NOTE — Telephone Encounter (Signed)
 Outpatient Oncology Nursing Narrative    VM from patient requesting call back to discuss why she is not scheduled for Lutathera  yet.    Routed to provider team to review and discuss.

## 2024-04-11 NOTE — Telephone Encounter (Signed)
 Outpatient Oncology RN Narrative    - Message sent to nuclear medicine pool

## 2024-04-12 ENCOUNTER — Telehealth (HOSPITAL_BASED_OUTPATIENT_CLINIC_OR_DEPARTMENT_OTHER): Payer: Self-pay

## 2024-04-12 NOTE — Telephone Encounter (Signed)
 Outpatient Oncology RN Narrative    -Following discussion with Dr. Rexford, will place patient in tumor board for 10/28 for Lutathera , spoke with patient and informed of above, patient verbalized understanding, will wait for TB decision

## 2024-04-13 ENCOUNTER — Encounter (HOSPITAL_BASED_OUTPATIENT_CLINIC_OR_DEPARTMENT_OTHER): Payer: Self-pay | Admitting: Hematology & Oncology

## 2024-04-19 ENCOUNTER — Telehealth (HOSPITAL_BASED_OUTPATIENT_CLINIC_OR_DEPARTMENT_OTHER): Payer: Self-pay

## 2024-04-19 ENCOUNTER — Telehealth (HOSPITAL_BASED_OUTPATIENT_CLINIC_OR_DEPARTMENT_OTHER): Payer: Self-pay | Admitting: Hematology & Oncology

## 2024-04-19 NOTE — Telephone Encounter (Signed)
 TC to patient. Per previous telephone encounter, it is recommended that per: TB recommending 2 more Lutathera  cycles.     Informed patient to reach out to Darren to schedule. Patient will contact Darren to schedule. No further action needed at this time.

## 2024-04-19 NOTE — Telephone Encounter (Addendum)
 Outpatient Oncology Nursing Narrative    Secure chat to Dr. Rexford. TB recommending 2 more Lutathera  cycles.    Call to patient. VM left with above details.

## 2024-04-19 NOTE — Telephone Encounter (Signed)
 2 cycles of PRRT per tumor board discussion

## 2024-04-19 NOTE — Telephone Encounter (Signed)
 Received call from patient she is requesting a call back she need to find out what is the decision on her restarting Lutathera  therapy.

## 2024-04-19 NOTE — Telephone Encounter (Signed)
 Per Dr. Rexford via secure chat. Cycles 5 &6 added.    No further action needed at this time

## 2024-04-21 ENCOUNTER — Ambulatory Visit
Admission: RE | Admit: 2024-04-21 | Discharge: 2024-04-21 | Disposition: A | Discharge status: Home / Self Care | Attending: Hematology & Oncology | Admitting: Hematology & Oncology

## 2024-04-21 ENCOUNTER — Other Ambulatory Visit (HOSPITAL_BASED_OUTPATIENT_CLINIC_OR_DEPARTMENT_OTHER): Payer: Self-pay | Admitting: Hematology & Oncology

## 2024-04-21 ENCOUNTER — Telehealth (HOSPITAL_BASED_OUTPATIENT_CLINIC_OR_DEPARTMENT_OTHER): Payer: Self-pay

## 2024-04-21 VITALS — BP 147/77 | HR 78 | Temp 97.0°F | Resp 16 | Wt 150.4 lb

## 2024-04-21 DIAGNOSIS — C254 Malignant neoplasm of endocrine pancreas: Secondary | ICD-10-CM | POA: Insufficient documentation

## 2024-04-21 DIAGNOSIS — C787 Secondary malignant neoplasm of liver and intrahepatic bile duct: Secondary | ICD-10-CM | POA: Insufficient documentation

## 2024-04-21 DIAGNOSIS — D3A8 Other benign neuroendocrine tumors: Secondary | ICD-10-CM

## 2024-04-21 DIAGNOSIS — E3400 Carcinoid syndrome, unspecified: Secondary | ICD-10-CM | POA: Insufficient documentation

## 2024-04-21 LAB — CEA TUMOR, BLOOD: CEA Tumor: 8.6 ng/mL

## 2024-04-21 LAB — COMPREHENSIVE METABOLIC PANEL, BLOOD
ALT (SGPT): 46 U/L — ABNORMAL HIGH (ref 0–35)
AST (SGOT): 28 U/L (ref 0–35)
Albumin: 4.2 g/dL (ref 3.5–5.2)
Alkaline Phos: 167 U/L — ABNORMAL HIGH (ref 40–130)
Anion Gap: 12 mmol/L (ref 7–15)
BUN: 15 mg/dL (ref 8–23)
Bicarbonate: 27 mmol/L (ref 22–29)
Bilirubin, Tot: 0.36 mg/dL (ref <1.2)
Calcium: 9.1 mg/dL (ref 8.5–10.6)
Chloride: 100 mmol/L (ref 98–107)
Creatinine: 0.54 mg/dL (ref 0.51–0.95)
Glucose: 113 mg/dL — ABNORMAL HIGH (ref 70–99)
Potassium: 3.7 mmol/L (ref 3.5–5.1)
Sodium: 139 mmol/L (ref 136–145)
Total Protein: 7.4 g/dL (ref 6.0–8.0)
eGFR Based on CKD-EPI 2021 Equation: >60 mL/min/1.73 m2

## 2024-04-21 LAB — CBC WITH DIFF, BLOOD
ANC-Automated: 2.9 1000/mm3 (ref 1.6–7.0)
ANC-Instrument: 2.9 1000/mm3 (ref 1.6–7.0)
Abs Basophils: 0 1000/mm3 (ref <0.2)
Abs Eosinophils: 0 1000/mm3 (ref 0.0–0.5)
Abs Lymphs: 1.4 1000/mm3 (ref 0.8–3.1)
Abs Monos: 0.5 1000/mm3 (ref 0.2–0.8)
Basophils: 0.2 %
Eosinophils: 0.8 %
Hct: 34.9 % (ref 34.0–45.0)
Hgb: 12.1 g/dL (ref 11.2–15.7)
Imm Gran %: 0.4 % (ref <1)
Imm Gran Abs: 0 1000/mm3 (ref <0.1)
Lymphocytes: 28.4 %
MCH: 31.7 pg (ref 26.0–32.0)
MCHC: 34.7 g/dL (ref 32.0–36.0)
MCV: 91.4 um3 (ref 79.0–95.0)
MPV: 9.6 fL (ref 9.4–12.4)
Monocytes: 10.4 %
Plt Count: 165 1000/mm3 (ref 140–370)
RBC: 3.82 mill/mm3 — ABNORMAL LOW (ref 3.90–5.20)
RDW: 13.1 % (ref 12.0–14.0)
Segs: 59.8 %
WBC: 4.9 1000/mm3 (ref 4.0–10.0)

## 2024-04-21 MED ORDER — LANREOTIDE ACETATE 120 MG/0.5ML SC SOLN
120.0000 mg | Freq: Once | SUBCUTANEOUS | Status: AC
Start: 2024-04-21 — End: 2024-04-21
  Administered 2024-04-21: 120 mg via SUBCUTANEOUS
  Filled 2024-04-21: qty 1

## 2024-04-21 NOTE — Telephone Encounter (Signed)
 TC to pt. Notified pt that per Dr. Rexford if she can get a fast track apt today it is okay for her to get the last dose of Lanreotide before her 4 week break. She will call infusion schedulers.

## 2024-04-21 NOTE — Telephone Encounter (Signed)
 Received VM today that pt was able to get her Lutathera  scheduled for 12/1. She knows she needs a 4 week break between her Lutathera  and her Lanreotide. She is wondering if she should try and get one last dose of Lanreotide today as Monday will be the start of the 4 week break. She is not currently scheduled for a fast track appointment today.

## 2024-04-21 NOTE — Interdisciplinary (Signed)
 Fast Track Nursing Note - [facility]  68 year old female @ [facility] Fast Track for lanreotide injection and labs.     ICD-10-CM ICD-9-CM   1. Carcinoid syndrome  E34.00 259.2   2. 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 Malignant neoplasm of endocrine pancreas (CMS-HCC)  C25.4 157.4     Chief Complaint:  Imm/Inj and Collect Specimen    Vital Signs:  BP 147/77 (BP Location: Left arm, BP Patient Position: Sitting)   Pulse 78   Temp 97 F (36.1 C) (Temporal)   Resp 16   Wt 68.2 kg (150 lb 5.7 oz)   SpO2 97%   BMI 24.90 kg/m   Assessment:   Patient denies fever, chills, NVD, shortness of breath, or coughing. Patient has tolerated lantreotide many times for several years in the past.   Access:  Labs drawn from L [facility] peripherally, site covered w/ gauze and coband. Lantreotide administered to R glute, site covered w/ band-aid.   Discharge Plan:  No follow-ups on file. Patient prefers to call to schedule next appointment.   Discharge Mode: Ambulatory  Discharge Time: 1630  Accompanied by: Self  Discharged To: Home   Treatment Administration  Medications   lanreotide acetate  (SOMATULINE DEPOT ) depot injection 120 mg (120 mg Subcutaneous Given 04/21/24 1630)

## 2024-04-28 NOTE — Addendum Note (Signed)
 Encounter addended by: Andy Andes on: 04/28/2024 6:19 PM   Actions taken: Flowsheet accepted, Charge Capture section accepted

## 2024-05-15 ENCOUNTER — Other Ambulatory Visit: Attending: Hematology & Oncology

## 2024-05-15 DIAGNOSIS — Z09 Encounter for follow-up examination after completed treatment for conditions other than malignant neoplasm: Secondary | ICD-10-CM

## 2024-05-15 DIAGNOSIS — D3A8 Other benign neuroendocrine tumors: Secondary | ICD-10-CM

## 2024-05-15 DIAGNOSIS — C254 Malignant neoplasm of endocrine pancreas: Secondary | ICD-10-CM | POA: Insufficient documentation

## 2024-05-15 LAB — CBC WITH DIFF, BLOOD
ANC-Automated: 3.5 1000/mm3 (ref 1.6–7.0)
ANC-Instrument: 3.5 1000/mm3 (ref 1.6–7.0)
Abs Basophils: 0 1000/mm3 (ref <0.2)
Abs Eosinophils: 0 1000/mm3 (ref 0.0–0.5)
Abs Lymphs: 1.4 1000/mm3 (ref 0.8–3.1)
Abs Monos: 0.5 1000/mm3 (ref 0.2–0.8)
Basophils: 0.2 %
Eosinophils: 0.7 %
Hct: 37.9 % (ref 34.0–45.0)
Hgb: 12.9 g/dL (ref 11.2–15.7)
Imm Gran %: 0.7 % (ref <1)
Imm Gran Abs: 0 1000/mm3 (ref <0.1)
Lymphocytes: 25.3 %
MCH: 31.3 pg (ref 26.0–32.0)
MCHC: 34 g/dL (ref 32.0–36.0)
MCV: 92 um3 (ref 79.0–95.0)
MPV: 9.6 fL (ref 9.4–12.4)
Monocytes: 9.1 %
Plt Count: 180 1000/mm3 (ref 140–370)
RBC: 4.12 mill/mm3 (ref 3.90–5.20)
RDW: 13.2 % (ref 12.0–14.0)
Segs: 64 %
WBC: 5.5 1000/mm3 (ref 4.0–10.0)

## 2024-05-15 LAB — COMPREHENSIVE METABOLIC PANEL, BLOOD
ALT (SGPT): 50 U/L — ABNORMAL HIGH (ref 0–35)
AST (SGOT): 34 U/L (ref 0–35)
Albumin: 4.4 g/dL (ref 3.5–5.2)
Alkaline Phos: 173 U/L — ABNORMAL HIGH (ref 40–130)
Anion Gap: 13 mmol/L (ref 7–15)
BUN: 13 mg/dL (ref 8–23)
Bicarbonate: 24 mmol/L (ref 22–29)
Bilirubin, Tot: 0.54 mg/dL (ref <1.2)
Calcium: 9.8 mg/dL (ref 8.5–10.6)
Chloride: 102 mmol/L (ref 98–107)
Creatinine: 0.58 mg/dL (ref 0.51–0.95)
Glucose: 149 mg/dL — ABNORMAL HIGH (ref 70–99)
Potassium: 4.4 mmol/L (ref 3.5–5.1)
Sodium: 139 mmol/L (ref 136–145)
Total Protein: 7.2 g/dL (ref 6.0–8.0)
eGFR Based on CKD-EPI 2021 Equation: >60 mL/min/1.73 m2

## 2024-05-15 LAB — HCG QUANTITATIVE, BLOOD: HCG Qt: 3 m[IU]/mL

## 2024-05-16 LAB — CEA TUMOR, BLOOD: CEA Tumor: 11.2 ng/mL

## 2024-05-17 ENCOUNTER — Telehealth (HOSPITAL_BASED_OUTPATIENT_CLINIC_OR_DEPARTMENT_OTHER): Payer: Self-pay | Admitting: Nurse Practitioner

## 2024-05-17 NOTE — Telephone Encounter (Signed)
 Outpatient Oncology RN Narrative    - Spoke with patient, has two lutathera  apts, will call oncology team back after Monday to schedule f/u

## 2024-05-17 NOTE — Telephone Encounter (Signed)
 Called pt to schedule f/u with NP Kip in December per in-basket request from [place]. Pt declined scheduling, she would like to speak with [facility] prior to know what the appt is for and the best timing. Pt is requesting a call back. Thank you

## 2024-05-22 ENCOUNTER — Ambulatory Visit
Admission: RE | Admit: 2024-05-22 | Discharge: 2024-05-22 | Disposition: A | Attending: Hematology & Oncology | Admitting: Hematology & Oncology

## 2024-05-22 VITALS — BP 118/85 | HR 75 | Temp 97.6°F | Resp 18

## 2024-05-22 DIAGNOSIS — D3A8 Other benign neuroendocrine tumors: Secondary | ICD-10-CM

## 2024-05-22 DIAGNOSIS — E3400 Carcinoid syndrome, unspecified: Secondary | ICD-10-CM

## 2024-05-22 DIAGNOSIS — C254 Malignant neoplasm of endocrine pancreas: Secondary | ICD-10-CM | POA: Insufficient documentation

## 2024-05-22 DIAGNOSIS — C787 Secondary malignant neoplasm of liver and intrahepatic bile duct: Secondary | ICD-10-CM

## 2024-05-22 MED ORDER — ACETAMINOPHEN 325 MG PO TABS
650.0000 mg | ORAL_TABLET | Freq: Once | ORAL | Status: AC
  Administered 2024-05-22: 650 mg via ORAL
  Filled 2024-05-22: qty 2

## 2024-05-22 MED ORDER — SODIUM CHLORIDE 0.9 % IV SOLN
Freq: Once | INTRAVENOUS | Status: AC
  Filled 2024-05-22: qty 1000

## 2024-05-22 MED ORDER — SODIUM CHLORIDE 0.9 % IV SOLN
10.0000 mg | Freq: Once | INTRAVENOUS | Status: AC
  Administered 2024-05-22: 10 mg via INTRAVENOUS
  Filled 2024-05-22: qty 1

## 2024-05-22 MED ORDER — ONDANSETRON HCL 4 MG/2ML IV SOLN
8.0000 mg | Freq: Once | INTRAMUSCULAR | Status: AC
  Administered 2024-05-22: 8 mg via INTRAVENOUS
  Filled 2024-05-22: qty 4

## 2024-05-22 MED ORDER — LUTETIUM LU 177 DOTATATE 370 MBQ/ML IV SOLN
190.6900 | Freq: Once | INTRAVENOUS | Status: AC
  Administered 2024-05-22: 190.69 via INTRAVENOUS
  Filled 2024-05-22: qty 191

## 2024-05-22 MED ORDER — AMINO ACID 5 % IV SOLN
1000.0000 mL | Freq: Once | INTRAVENOUS | Status: AC
  Administered 2024-05-22: 1000 mL via INTRAVENOUS
  Filled 2024-05-22: qty 1000

## 2024-05-22 MED ORDER — OCTREOTIDE ACETATE 30 MG IM KIT
30.0000 mg | PACK | Freq: Once | INTRAMUSCULAR | Status: AC
  Administered 2024-05-22: 30 mg via INTRAMUSCULAR
  Filled 2024-05-22 (×2): qty 1

## 2024-05-22 MED ORDER — LANREOTIDE ACETATE 120 MG/0.5ML SC SOLN
120.0000 mg | Freq: Once | SUBCUTANEOUS | Status: DC
  Filled 2024-05-22: qty 1

## 2024-05-22 MED ORDER — FAMOTIDINE (PF) 20 MG/2ML IV SOLN
20.0000 mg | Freq: Once | INTRAVENOUS | Status: AC
  Administered 2024-05-22: 20 mg via INTRAVENOUS
  Filled 2024-05-22: qty 2

## 2024-05-22 NOTE — Nursing Note (Addendum)
 Pt received Lutathera  including pre-meds and post-med per protocol, tolerated well. Initial nausea resolved. Instructions provided re: post care infusion and care for home. Pt verbalizes understanding. Piv dc'd x2, cannula intact. Pt ambulatory out of treatment area without incident.

## 2024-06-01 ENCOUNTER — Encounter (HOSPITAL_BASED_OUTPATIENT_CLINIC_OR_DEPARTMENT_OTHER): Payer: Self-pay | Admitting: Nurse Practitioner

## 2024-06-01 ENCOUNTER — Telehealth (HOSPITAL_BASED_OUTPATIENT_CLINIC_OR_DEPARTMENT_OTHER): Payer: Self-pay

## 2024-06-01 ENCOUNTER — Encounter (HOSPITAL_BASED_OUTPATIENT_CLINIC_OR_DEPARTMENT_OTHER): Payer: Self-pay | Admitting: Hematology & Oncology

## 2024-06-01 DIAGNOSIS — D3A8 Other benign neuroendocrine tumors: Secondary | ICD-10-CM

## 2024-06-01 DIAGNOSIS — E3400 Carcinoid syndrome, unspecified: Secondary | ICD-10-CM

## 2024-06-01 DIAGNOSIS — C7B02 Secondary carcinoid tumors of liver: Secondary | ICD-10-CM

## 2024-06-01 DIAGNOSIS — C787 Secondary malignant neoplasm of liver and intrahepatic bile duct: Secondary | ICD-10-CM

## 2024-06-01 DIAGNOSIS — C254 Malignant neoplasm of endocrine pancreas: Secondary | ICD-10-CM

## 2024-06-01 NOTE — Telephone Encounter (Signed)
 Outpatient Oncology RN Narrative    - Spoke to patient, informed that she should be getting imaging 4 weeks after last Lutathera  injection, order in, additional Lanreotide orders were placed, next Lanreotide injection ordered for December 30, patient verbalized understanding

## 2024-06-01 NOTE — Addendum Note (Signed)
 Addended byBETHA KIP DOMINO on: 06/01/2024 04:13 PM     Modules accepted: Orders

## 2024-06-01 NOTE — Telephone Encounter (Signed)
 Outpatient Oncology RN Narrative    - Message received, patient needs additional Lanreotide orders so she can schedule, last Lutathera  scheduled for 1/26, per Dr. Rexford, patient to schedule scans for late February, about 4 weeks after last Lutathera  injection, routing to team to add additional Lanreotide orders

## 2024-06-20 ENCOUNTER — Encounter (HOSPITAL_BASED_OUTPATIENT_CLINIC_OR_DEPARTMENT_OTHER): Payer: Self-pay | Admitting: Hematology & Oncology

## 2024-06-20 ENCOUNTER — Ambulatory Visit
Admission: RE | Admit: 2024-06-20 | Discharge: 2024-06-20 | Disposition: A | Discharge status: Home / Self Care | Attending: Nurse Practitioner | Admitting: Nurse Practitioner

## 2024-06-20 VITALS — BP 141/68 | HR 85 | Temp 98.0°F | Resp 16 | Wt 147.7 lb

## 2024-06-20 DIAGNOSIS — C254 Malignant neoplasm of endocrine pancreas: Secondary | ICD-10-CM | POA: Insufficient documentation

## 2024-06-20 DIAGNOSIS — C787 Secondary malignant neoplasm of liver and intrahepatic bile duct: Secondary | ICD-10-CM | POA: Insufficient documentation

## 2024-06-20 DIAGNOSIS — E3400 Carcinoid syndrome, unspecified: Secondary | ICD-10-CM

## 2024-06-20 LAB — CBC WITH DIFF, BLOOD
ANC-Automated: 3 1000/mm3 (ref 1.6–7.0)
ANC-Instrument: 3 1000/mm3 (ref 1.6–7.0)
Abs Basophils: 0 1000/mm3 (ref <0.2)
Abs Eosinophils: 0 1000/mm3 (ref 0.0–0.5)
Abs Lymphs: 0.6 1000/mm3 — ABNORMAL LOW (ref 0.8–3.1)
Abs Monos: 0.6 1000/mm3 (ref 0.2–0.8)
Basophils: 0.5 %
Eosinophils: 0.7 %
Hct: 35.4 % (ref 34.0–45.0)
Hgb: 12.3 g/dL (ref 11.2–15.7)
Imm Gran %: 0.2 % (ref <1)
Imm Gran Abs: 0 1000/mm3 (ref <0.1)
Lymphocytes: 13.6 %
MCH: 31.5 pg (ref 26.0–32.0)
MCHC: 34.7 g/dL (ref 32.0–36.0)
MCV: 90.8 um3 (ref 79.0–95.0)
MPV: 9.8 fL (ref 9.4–12.4)
Monocytes: 13.8 %
Plt Count: 145 1000/mm3 (ref 140–370)
RBC: 3.9 mill/mm3 (ref 3.90–5.20)
RDW: 13.3 % (ref 12.0–14.0)
Segs: 71.2 %
WBC: 4.3 1000/mm3 (ref 4.0–10.0)

## 2024-06-20 LAB — COMPREHENSIVE METABOLIC PANEL, BLOOD
ALT (SGPT): 107 U/L — ABNORMAL HIGH (ref 0–35)
AST (SGOT): 63 U/L — ABNORMAL HIGH (ref 0–35)
Albumin: 4.2 g/dL (ref 3.5–5.2)
Alkaline Phos: 210 U/L — ABNORMAL HIGH (ref 40–130)
Anion Gap: 10 mmol/L (ref 7–15)
BUN: 10 mg/dL (ref 8–23)
Bicarbonate: 29 mmol/L (ref 22–29)
Bilirubin, Tot: 0.66 mg/dL (ref <1.2)
Calcium: 9.4 mg/dL (ref 8.5–10.6)
Chloride: 104 mmol/L (ref 98–107)
Creatinine: 0.46 mg/dL — ABNORMAL LOW (ref 0.51–0.95)
Glucose: 132 mg/dL — ABNORMAL HIGH (ref 70–99)
Potassium: 3.4 mmol/L — ABNORMAL LOW (ref 3.5–5.1)
Sodium: 143 mmol/L (ref 136–145)
Total Protein: 7.2 g/dL (ref 6.0–8.0)
eGFR Based on CKD-EPI 2021 Equation: >60 mL/min/1.73 m2

## 2024-06-20 LAB — CEA TUMOR, BLOOD: CEA Tumor: 15.5 ng/mL

## 2024-06-20 MED ORDER — LANREOTIDE ACETATE 120 MG/0.5ML SC SOLN
120.0000 mg | Freq: Once | SUBCUTANEOUS | Status: AC
  Administered 2024-06-20: 120 mg via SUBCUTANEOUS
  Filled 2024-06-20: qty 1

## 2024-06-20 NOTE — Interdisciplinary (Signed)
 Patient received ambulatory and in stable condition, denies adverse symptoms.  Labs collected and sent via butterfly needle.  Injection of Lanreotide given to right buttocks as per patient request.   Discharged patient ambulatory and in stable condition, patient aware of all future appointmets. Appointment request made for patient on 08/16/24 for [facility].

## 2024-06-20 NOTE — Discharge Instructions (Signed)
 For any questions or concerns, you can call your Hematology/Oncology doctor's office, Monday - Friday, during business hours.   Please keep the clinic phone number in an easy to find place.     If you are unable to reach your nurse AND you have urgent symptoms, call [facility] at [phone] from 8 a.m. to 9 p.m, Monday through Friday.     For medical emergencies, please call 911    Symptoms that may require emergency care:    Allergic reaction: itching or hives, swelling in your face and hands, swelling or tingling in your mouth or throat, chest tightness, trouble breathing  Temperature greater than or equal to 100.5 degrees Fahrenheit  Sudden onset of chest pain or shortness of breath, worsening shortness of breath  Abdominal pain with nausea, vomiting, or fever  Numbness or weakness in your face or limbs  Lightheadedness, dizziness, or fainting  Unusual bleeding, bruising, or weakness    For urgent concerns after hours, weekdays, and holidays, you may call the on-call Hematology/Oncology doctor:    [facility]  [place] and [place]:  [phone]    [place], [place], and [place] [facility]  [facility]  [facility] at [address]  [phone]    Patients are advised to contact the infusion scheduling line at [phone] to coordinate appointments and ensure timely access to treatment.   For [place], [employer], and [place], please press 2  For [facility] locations: [place], [place], and [place], please press 3 Para cualquier pregunta o duda, puede llamar al [facility] de su mdico de Hematologa/Oncologa, de lunes a viernes, durante el horario de atencin.   Guarde el nmero de telfono de la clnica en un lugar fcil de Clinical research associate.      Si no puede comunicarse con su enfermera Y tiene sntomas urgentes, llame a [facility] ([facility] el mismo da) al  [phone] de 8 a.m. a 9 p.m., de lunes a viernes.      En caso de una emergencia mdica, llame al 911      Sntomas que podran necesitar atencin de emergencia:      -     Reaccin alrgica: picazn o urticaria, hinchazn de la cara y las manos, hinchazn u hormigueo en la boca o la garganta, opresin en el pecho, dificultad para respirar   -     Temperatura mayor o igual a 100.5 grados Fahrenheit   -     Aparicin repentina de dolor torcico o falta de aliento, falta de aliento que empeora   -     Dolor abdominal con nuseas, vmitos o fiebre   -     Entumecimiento o debilidad en la cara o las extremidades   -     Mareo, vrtigo o desmayo   -     Sangrado, hematomas o debilidad inusuales      Para cuestiones urgentes fuera del horario de atencin, [place] laborables y festivos, puede llamar al mdico de guardia de Hematologa/Oncologa:      [employer] and [place]:  [phone]    [facility] and [facility]  [facility]  [phone]    Se recomienda a los pacientes comunicarse con la linea de programacion de infusiones al [phone] para coordinar sus citas y garantizar un accesso portuno al [place].   Murrell Millers, [facility], y [facility], por favor presione 2  Para las ubicaciones en [address]: [place], [place], y [place], por favor presione 3

## 2024-06-23 NOTE — Addendum Note (Signed)
 Encounter addended by: Gjergji, Elona on: 06/23/2024 8:10 AM   Actions taken: Charge Capture section accepted

## 2024-07-12 ENCOUNTER — Other Ambulatory Visit

## 2024-07-12 DIAGNOSIS — C254 Malignant neoplasm of endocrine pancreas: Secondary | ICD-10-CM | POA: Insufficient documentation

## 2024-07-12 DIAGNOSIS — C787 Secondary malignant neoplasm of liver and intrahepatic bile duct: Secondary | ICD-10-CM | POA: Insufficient documentation

## 2024-07-12 DIAGNOSIS — E3400 Carcinoid syndrome, unspecified: Secondary | ICD-10-CM | POA: Insufficient documentation

## 2024-07-12 LAB — CBC WITH DIFF, BLOOD
ANC-Automated: 2.4 1000/mm3 (ref 1.6–7.0)
ANC-Instrument: 2.4 1000/mm3 (ref 1.6–7.0)
Abs Basophils: 0 1000/mm3 (ref <0.2)
Abs Eosinophils: 0 1000/mm3 (ref 0.0–0.5)
Abs Lymphs: 1 1000/mm3 (ref 0.8–3.1)
Abs Monos: 0.6 1000/mm3 (ref 0.2–0.8)
Basophils: 0.2 %
Eosinophils: 0.7 %
Hct: 38.6 % (ref 34.0–45.0)
Hgb: 13.4 g/dL (ref 11.2–15.7)
Imm Gran %: 0.2 % (ref <1)
Imm Gran Abs: 0 1000/mm3 (ref <0.1)
Lymphocytes: 25.3 %
MCH: 32 pg (ref 26.0–32.0)
MCHC: 34.7 g/dL (ref 32.0–36.0)
MCV: 92.1 um3 (ref 79.0–95.0)
MPV: 9.5 fL (ref 9.4–12.4)
Monocytes: 15.1 %
Plt Count: 189 1000/mm3 (ref 140–370)
RBC: 4.19 mill/mm3 (ref 3.90–5.20)
RDW: 13.6 % (ref 12.0–14.0)
Segs: 58.5 %
WBC: 4.1 1000/mm3 (ref 4.0–10.0)

## 2024-07-12 LAB — COMPREHENSIVE METABOLIC PANEL, BLOOD
ALT (SGPT): 77 U/L — ABNORMAL HIGH (ref 0–35)
AST (SGOT): 43 U/L — ABNORMAL HIGH (ref 0–35)
Albumin: 4.8 g/dL (ref 3.5–5.2)
Alkaline Phos: 230 U/L — ABNORMAL HIGH (ref 40–130)
Anion Gap: 10 mmol/L (ref 7–15)
BUN: 14 mg/dL (ref 8–23)
Bicarbonate: 29 mmol/L (ref 22–29)
Bilirubin, Tot: 0.63 mg/dL (ref <1.2)
Calcium: 10 mg/dL (ref 8.5–10.6)
Chloride: 101 mmol/L (ref 98–107)
Creatinine: 0.51 mg/dL (ref 0.51–0.95)
Glucose: 91 mg/dL (ref 70–99)
Potassium: 4.6 mmol/L (ref 3.5–5.1)
Sodium: 140 mmol/L (ref 136–145)
Total Protein: 7.8 g/dL (ref 6.0–8.0)
eGFR Based on CKD-EPI 2021 Equation: >60 mL/min/1.73 m2

## 2024-07-12 LAB — HCG QUANTITATIVE, BLOOD: HCG Qt: 3 m[IU]/mL

## 2024-07-12 LAB — CEA TUMOR, BLOOD: CEA Tumor: 23.2 ng/mL

## 2024-07-17 ENCOUNTER — Ambulatory Visit
Admit: 2024-07-17 | Discharge: 2024-07-17 | Disposition: A | Discharge status: Home Health | Attending: Hematology & Oncology | Admitting: Hematology & Oncology

## 2024-07-17 ENCOUNTER — Encounter (HOSPITAL_BASED_OUTPATIENT_CLINIC_OR_DEPARTMENT_OTHER): Payer: Self-pay | Admitting: Hematology & Oncology

## 2024-07-17 VITALS — BP 150/73 | HR 80 | Temp 97.5°F | Resp 16

## 2024-07-17 DIAGNOSIS — C787 Secondary malignant neoplasm of liver and intrahepatic bile duct: Secondary | ICD-10-CM

## 2024-07-17 DIAGNOSIS — C254 Malignant neoplasm of endocrine pancreas: Secondary | ICD-10-CM

## 2024-07-17 DIAGNOSIS — E3400 Carcinoid syndrome, unspecified: Secondary | ICD-10-CM

## 2024-07-17 DIAGNOSIS — D3A8 Other benign neuroendocrine tumors: Secondary | ICD-10-CM

## 2024-07-17 MED ORDER — AMINO ACID 5 % IV SOLN
1000.0000 mL | Freq: Once | INTRAVENOUS | Status: AC
  Administered 2024-07-17: 1000 mL via INTRAVENOUS
  Filled 2024-07-17: qty 1000

## 2024-07-17 MED ORDER — LANREOTIDE ACETATE 120 MG/0.5ML SC SOLN: 120.0000 mg | Freq: Once | SUBCUTANEOUS | Status: DC

## 2024-07-17 MED ORDER — LUTETIUM LU 177 DOTATATE 370 MBQ/ML IV SOLN
191.0000 | Freq: Once | INTRAVENOUS | Status: AC
  Administered 2024-07-17: 191 via INTRAVENOUS
  Filled 2024-07-17: qty 191

## 2024-07-17 MED ORDER — LANREOTIDE ACETATE 120 MG/0.5ML SC SOLN
120.0000 mg | Freq: Once | SUBCUTANEOUS | Status: AC
  Administered 2024-07-17: 120 mg via SUBCUTANEOUS
  Filled 2024-07-17: qty 1

## 2024-07-17 MED ORDER — SODIUM CHLORIDE 0.9 % IV SOLN
INTRAVENOUS | Status: DC
  Filled 2024-07-17: qty 1000

## 2024-07-17 MED ORDER — ACETAMINOPHEN 325 MG PO TABS
650.0000 mg | ORAL_TABLET | Freq: Once | ORAL | Status: AC
  Administered 2024-07-17: 650 mg via ORAL
  Filled 2024-07-17: qty 2

## 2024-07-17 MED ORDER — SODIUM CHLORIDE 0.9 % IV SOLN
10.0000 mg | Freq: Once | INTRAVENOUS | Status: AC
  Administered 2024-07-17: 10 mg via INTRAVENOUS
  Filled 2024-07-17: qty 1

## 2024-07-17 MED ORDER — ONDANSETRON HCL 4 MG/2ML IV SOLN
8.0000 mg | Freq: Once | INTRAMUSCULAR | Status: AC
  Administered 2024-07-17: 8 mg via INTRAVENOUS
  Filled 2024-07-17: qty 4

## 2024-07-17 MED ORDER — FAMOTIDINE (PF) 20 MG/2ML IV SOLN
20.0000 mg | Freq: Once | INTRAVENOUS | Status: AC
  Administered 2024-07-17: 20 mg via INTRAVENOUS
  Filled 2024-07-17: qty 2

## 2024-07-17 NOTE — Nursing Note (Signed)
 [place]  infusion complete. Pt AAOx4, VSS, and tolerated infusion well without issues. PIV removed. Pt verbalizes understanding of radiation safety/precautions and discharged home in stable condition.

## 2024-08-14 ENCOUNTER — Ambulatory Visit

## 2024-08-14 ENCOUNTER — Ambulatory Visit (HOSPITAL_BASED_OUTPATIENT_CLINIC_OR_DEPARTMENT_OTHER)

## 2024-08-16 ENCOUNTER — Ambulatory Visit

## 2024-08-23 ENCOUNTER — Ambulatory Visit: Admitting: Hematology & Oncology
